# Patient Record
Sex: Female | Born: 1970 | Race: White | Hispanic: No | Smoking: Former smoker
Health system: Southern US, Community
[De-identification: ages and names within clinical notes are randomized; demographics above are authoritative.]

## PROBLEM LIST (undated history)

## (undated) DIAGNOSIS — I639 Cerebral infarction, unspecified: Secondary | ICD-10-CM

## (undated) DIAGNOSIS — E785 Hyperlipidemia, unspecified: Secondary | ICD-10-CM

## (undated) DIAGNOSIS — J45909 Unspecified asthma, uncomplicated: Secondary | ICD-10-CM

## (undated) DIAGNOSIS — R197 Diarrhea, unspecified: Secondary | ICD-10-CM

## (undated) HISTORY — DX: Diarrhea, unspecified: R19.7

## (undated) HISTORY — PX: OTHER SURGICAL HISTORY: SHX169

## (undated) HISTORY — DX: Hyperlipidemia, unspecified: E78.5

## (undated) HISTORY — DX: Unspecified asthma, uncomplicated: J45.909

## (undated) HISTORY — DX: Cerebral infarction, unspecified: I63.9

## (undated) HISTORY — PX: PARTIAL HYSTERECTOMY: SHX80

---

## 1990-07-30 HISTORY — PX: APPENDECTOMY: SHX54

## 2007-07-31 HISTORY — PX: TOTAL VAGINAL HYSTERECTOMY: SHX2548

## 2012-10-10 DIAGNOSIS — Z8249 Family history of ischemic heart disease and other diseases of the circulatory system: Secondary | ICD-10-CM | POA: Insufficient documentation

## 2012-10-14 DIAGNOSIS — F411 Generalized anxiety disorder: Secondary | ICD-10-CM

## 2012-10-14 HISTORY — DX: Generalized anxiety disorder: F41.1

## 2012-12-12 DIAGNOSIS — F418 Other specified anxiety disorders: Secondary | ICD-10-CM | POA: Insufficient documentation

## 2012-12-12 DIAGNOSIS — G43109 Migraine with aura, not intractable, without status migrainosus: Secondary | ICD-10-CM | POA: Insufficient documentation

## 2012-12-12 DIAGNOSIS — F3289 Other specified depressive episodes: Secondary | ICD-10-CM | POA: Insufficient documentation

## 2012-12-12 DIAGNOSIS — F32A Depression, unspecified: Secondary | ICD-10-CM | POA: Insufficient documentation

## 2012-12-12 DIAGNOSIS — F309 Manic episode, unspecified: Secondary | ICD-10-CM | POA: Insufficient documentation

## 2013-02-03 DIAGNOSIS — G8929 Other chronic pain: Secondary | ICD-10-CM | POA: Insufficient documentation

## 2018-07-25 DIAGNOSIS — M25512 Pain in left shoulder: Secondary | ICD-10-CM | POA: Insufficient documentation

## 2020-06-15 DIAGNOSIS — M7918 Myalgia, other site: Secondary | ICD-10-CM | POA: Insufficient documentation

## 2020-06-15 DIAGNOSIS — G8929 Other chronic pain: Secondary | ICD-10-CM | POA: Insufficient documentation

## 2020-06-15 HISTORY — DX: Other chronic pain: G89.29

## 2020-08-01 DIAGNOSIS — R531 Weakness: Secondary | ICD-10-CM | POA: Insufficient documentation

## 2021-01-31 ENCOUNTER — Other Ambulatory Visit (HOSPITAL_BASED_OUTPATIENT_CLINIC_OR_DEPARTMENT_OTHER): Payer: Self-pay | Admitting: Internal Medicine

## 2021-01-31 DIAGNOSIS — Z1231 Encounter for screening mammogram for malignant neoplasm of breast: Secondary | ICD-10-CM

## 2021-01-31 DIAGNOSIS — Z13820 Encounter for screening for osteoporosis: Secondary | ICD-10-CM

## 2021-04-20 DIAGNOSIS — G4700 Insomnia, unspecified: Secondary | ICD-10-CM

## 2021-04-20 DIAGNOSIS — F418 Other specified anxiety disorders: Secondary | ICD-10-CM

## 2021-04-20 HISTORY — DX: Other specified anxiety disorders: F41.8

## 2021-04-20 HISTORY — DX: Insomnia, unspecified: G47.00

## 2021-05-04 ENCOUNTER — Encounter: Payer: Self-pay | Admitting: Gastroenterology

## 2021-05-15 ENCOUNTER — Other Ambulatory Visit (HOSPITAL_BASED_OUTPATIENT_CLINIC_OR_DEPARTMENT_OTHER): Payer: Medicaid Other

## 2021-05-15 ENCOUNTER — Ambulatory Visit (HOSPITAL_BASED_OUTPATIENT_CLINIC_OR_DEPARTMENT_OTHER): Payer: Medicaid Other

## 2021-05-22 ENCOUNTER — Encounter: Payer: Self-pay | Admitting: General Surgery

## 2021-05-23 ENCOUNTER — Ambulatory Visit (INDEPENDENT_AMBULATORY_CARE_PROVIDER_SITE_OTHER): Payer: Medicaid Other | Admitting: Gastroenterology

## 2021-05-23 ENCOUNTER — Other Ambulatory Visit (INDEPENDENT_AMBULATORY_CARE_PROVIDER_SITE_OTHER): Payer: Medicaid Other

## 2021-05-23 ENCOUNTER — Encounter: Payer: Self-pay | Admitting: Gastroenterology

## 2021-05-23 ENCOUNTER — Other Ambulatory Visit: Payer: Self-pay

## 2021-05-23 VITALS — BP 96/68 | HR 73 | Ht 64.0 in | Wt 108.0 lb

## 2021-05-23 DIAGNOSIS — R112 Nausea with vomiting, unspecified: Secondary | ICD-10-CM

## 2021-05-23 DIAGNOSIS — R197 Diarrhea, unspecified: Secondary | ICD-10-CM | POA: Diagnosis not present

## 2021-05-23 DIAGNOSIS — R152 Fecal urgency: Secondary | ICD-10-CM

## 2021-05-23 DIAGNOSIS — R109 Unspecified abdominal pain: Secondary | ICD-10-CM | POA: Diagnosis not present

## 2021-05-23 DIAGNOSIS — R1011 Right upper quadrant pain: Secondary | ICD-10-CM

## 2021-05-23 DIAGNOSIS — R194 Change in bowel habit: Secondary | ICD-10-CM

## 2021-05-23 LAB — C-REACTIVE PROTEIN: CRP: <1 mg/dL (ref 0.5–20.0)

## 2021-05-23 MED ORDER — CLENPIQ 10-3.5-12 MG-GM -GM/160ML PO SOLN: 1.0000 | ORAL | 0 refills | Status: DC

## 2021-05-23 MED ORDER — DICYCLOMINE HCL 10 MG PO CAPS: 10.0000 mg | ORAL_CAPSULE | Freq: Four times a day (QID) | ORAL | 3 refills | Status: DC | PRN

## 2021-05-23 NOTE — Patient Instructions (Addendum)
If you are age 51 or older, your body mass index should be between 23-30. Your Body mass index is 18.54 kg/m. If this is out of the aforementioned range listed, please consider follow up with your Primary Care Provider.  If you are age 81 or younger, your body mass index should be between 19-25. Your Body mass index is 18.54 kg/m. If this is out of the aformentioned range listed, please consider follow up with your Primary Care Provider.   __________________________________________________________  The [facility] providers would like to encourage you to use [facility] to communicate with providers for non-urgent requests or questions.  Due to long hold times on the telephone, sending your provider a message by [facility] may be a faster and more efficient way to get a response.  Please allow 48 business hours for a response.  Please remember that this is for non-urgent requests.   Please go to the lab on the 2nd floor [address] before you leave the office today.   We have sent the following medications to your pharmacy for you to pick up at your convenience:  Clenpiq for colonoscopy prep Bentyl 10 mg every 6 hours as needed   Due to recent changes in healthcare laws, you may see the results of your imaging and laboratory studies on MyChart before your provider has had a chance to review them.  We understand that in some cases there may be results that are confusing or concerning to you. Not all laboratory results come back in the same time frame and the provider may be waiting for multiple results in order to interpret others.  Please give [place] 48 hours in order for your provider to thoroughly review all the results before contacting the office for clarification of your results.    Thank you for choosing me and [facility].  Vito Cirigliano, D.O.

## 2021-05-23 NOTE — Progress Notes (Signed)
Chief Complaint: Diarrhea, nausea/vomiting  Referring Provider:     Benito Mccreedy, MD   HPI:     Misty Beck is a 50 y.o. female with a history of depression with anxiety, hyperlipidemia, insomnia, referred to the Gastroenterology Clinic for evaluation of diarrhea.  New onset diarrhea over the last 8 weeks or so described as watery, nonbloody stools.  No mucus-like stools. +urgency. Occasional nocturnal stools with episodes of nocturnal fecal seepage. Has had episodes of daytime fecal incontinence.   Has had associated nausea and non-bloody emesis along with lower abdominal cramping.   Patient is otherwise without preceding exposures to include recent antibiotics, hospitalization, sick contacts, travel and denies new medications, supplements, OTCs.  Was seen by her PCM for this issue on 04/20/2021.  Was given trial of Imodium, GI referral, and labs (CBC, CMP, TSH, B12, folate, vitamin D; no results available for review). No change with Imodium. Has had some response to Lomotil since then, but stopped 7-10 days ago.  Otherwise, has not trialed any dietary modifications, fiber supplement, etc. Describes her diet as being unhealthy junk foods and poor.    No previous EGD or colonoscopy.  Reviewed available labs from 07/2020 at time of admission at [facility]: - WBC 7.9, H/H11.5/35, MCV/RDW 94/13, PLT 236 - Normal BMP, PT/INR  No known family history of CRC, GI malignancy, liver disease, pancreatic disease, or IBD.    Past Medical History:  Diagnosis Date   Asthma    Depression with anxiety 04/20/2021   Referral source Dr Doristine Section bonsu   Diarrhea    referral source   Hyperlipidemia    referral source   Insomnia 04/20/2021   referral source Dr Gayleen Orem   Stroke [facility])    Dec, 2021, Jan 2022     Past Surgical History:  Procedure Laterality Date   APPENDECTOMY  1992   left shoulder surgery     referral source Dr Roney Jaffe   PARTIAL HYSTERECTOMY      2009   Family History  Problem Relation Age of Onset   Colon polyps Mother    Skin cancer Mother    Breast cancer Mother    Heart disease Father    Colon cancer Neg Hx    Rectal cancer Neg Hx    Social History   Tobacco Use   Smoking status: Never   Smokeless tobacco: Never  Substance Use Topics   Alcohol use: Not Currently   Drug use: Not Currently   Current Outpatient Medications  Medication Sig Dispense Refill   LORazepam (ATIVAN) 0.5 MG tablet Take 0.5 mg by mouth every 8 (eight) hours.     sertraline (ZOLOFT) 100 MG tablet Take 100 mg by mouth daily.     traZODone (DESYREL) 100 MG tablet Take 100 mg by mouth at bedtime as needed for sleep.     No current facility-administered medications for this visit.   No Known Allergies   Review of Systems: All systems reviewed and negative except where noted in HPI.     Physical Exam:    Wt Readings from Last 3 Encounters:  05/23/21 108 lb (49 kg)    BP 96/68   Pulse 73   Ht _0  (1.626 m)   Wt 108 lb (49 kg)   SpO2 99%   BMI 18.54 kg/m  Constitutional:  Pleasant, in no acute distress. Psychiatric: Normal mood and affect. Behavior is normal. Cardiovascular: Normal rate, regular  rhythm. No edema Pulmonary/chest: Effort normal and breath sounds normal. No wheezing, rales or rhonchi. Abdominal: Mild RUQ pain without rebound or guarding.  No peritoneal signs.  Soft, nondistended. Bowel sounds active throughout. There are no masses palpable. No hepatomegaly. Neurological: Alert and oriented to person place and time. Skin: Skin is warm and dry. No rashes noted.   ASSESSMENT AND PLAN;   1) Diarrhea 2) Change in bowel habits 3) Fecal urgency/incontinence 4) Abdominal cramping 5) RUQ pain 6) Nausea/Vomiting  50 year old female with new onset significant changes in bowel habits along with associated abdominal pain and nausea/vomiting.  Discussed DDx today and plan as below:  - GI PCR panel, fecal calprotectin -  ESR, CRP, tTG IgA, IgA total - EGD and colonoscopy to evaluate for mucosal/luminal pathology with random and directed biopsies - Low FODMAP diet.  Provided with handout and detailed instruction today - Fiber supplement - Bentyl 10 mg prn Q6H  The indications, risks, and benefits of EGD and colonoscopy were explained to the patient in detail. Risks include but are not limited to bleeding, perforation, adverse reaction to medications, and cardiopulmonary compromise. Sequelae include but are not limited to the possibility of surgery, hospitalization, and mortality. The patient verbalized understanding and wished to proceed. All questions answered, referred to scheduler and bowel prep ordered. Further recommendations pending results of the exam.     Lavena Bullion, DO, FACG  05/23/2021, 9:07 AM   Benito Mccreedy, MD

## 2021-05-24 LAB — SEDIMENTATION RATE: Sed Rate: 6 mm/h (ref 0–30)

## 2021-05-24 LAB — TISSUE TRANSGLUTAMINASE, IGA: (tTG) Ab, IgA: <1 U/mL

## 2021-05-24 LAB — IGA: Immunoglobulin A: 175 mg/dL (ref 47–310)

## 2021-05-29 ENCOUNTER — Other Ambulatory Visit: Payer: Medicaid Other

## 2021-05-29 ENCOUNTER — Other Ambulatory Visit: Payer: Self-pay

## 2021-05-29 DIAGNOSIS — R1011 Right upper quadrant pain: Secondary | ICD-10-CM

## 2021-05-29 DIAGNOSIS — R112 Nausea with vomiting, unspecified: Secondary | ICD-10-CM

## 2021-05-29 DIAGNOSIS — R152 Fecal urgency: Secondary | ICD-10-CM

## 2021-05-29 DIAGNOSIS — R194 Change in bowel habit: Secondary | ICD-10-CM

## 2021-05-29 DIAGNOSIS — R109 Unspecified abdominal pain: Secondary | ICD-10-CM

## 2021-05-29 DIAGNOSIS — R197 Diarrhea, unspecified: Secondary | ICD-10-CM

## 2021-06-01 LAB — GI PROFILE, STOOL, PCR

## 2021-06-01 LAB — CALPROTECTIN, FECAL: Calprotectin, Fecal: <16 ug/g (ref 0–120)

## 2021-06-06 ENCOUNTER — Telehealth: Payer: Self-pay | Admitting: General Surgery

## 2021-06-06 NOTE — Telephone Encounter (Signed)
Notified the patient that her stool studies came back normal. The patient stated she is having diarrhea still. Would like to know what she can do at this time? Having her colonoscopy 07/11/2021

## 2021-06-06 NOTE — Telephone Encounter (Signed)
Okay to start Lomotil since infectious work-up was negative.

## 2021-06-06 NOTE — Telephone Encounter (Signed)
-----   Message from Christine, DO sent at 06/01/2021  4:48 PM EDT ----- Fecal calprotectin is normal and the GI PCR panel was negative/no infection.

## 2021-06-07 MED ORDER — DIPHENOXYLATE-ATROPINE 2.5-0.025 MG PO TABS: 1.0000 | ORAL_TABLET | Freq: Four times a day (QID) | ORAL | 0 refills | Status: DC | PRN

## 2021-06-07 NOTE — Telephone Encounter (Signed)
Left a detailed message that Dr Bryan Lemma stated she could start on Lomotil since her infectious workup was negative. Rx to pharmacy.

## 2021-06-19 ENCOUNTER — Encounter (HOSPITAL_BASED_OUTPATIENT_CLINIC_OR_DEPARTMENT_OTHER): Payer: Self-pay

## 2021-06-19 ENCOUNTER — Ambulatory Visit (HOSPITAL_BASED_OUTPATIENT_CLINIC_OR_DEPARTMENT_OTHER)
Admission: RE | Admit: 2021-06-19 | Discharge: 2021-06-19 | Disposition: A | Discharge status: Home / Self Care | Payer: Medicaid Other | Source: Ambulatory Visit | Attending: Internal Medicine | Admitting: Internal Medicine

## 2021-06-19 ENCOUNTER — Other Ambulatory Visit: Payer: Self-pay

## 2021-06-19 DIAGNOSIS — Z1231 Encounter for screening mammogram for malignant neoplasm of breast: Secondary | ICD-10-CM | POA: Insufficient documentation

## 2021-06-19 DIAGNOSIS — Z13820 Encounter for screening for osteoporosis: Secondary | ICD-10-CM | POA: Diagnosis present

## 2021-06-20 ENCOUNTER — Other Ambulatory Visit: Payer: Self-pay | Admitting: Internal Medicine

## 2021-06-20 DIAGNOSIS — R928 Other abnormal and inconclusive findings on diagnostic imaging of breast: Secondary | ICD-10-CM

## 2021-07-11 ENCOUNTER — Other Ambulatory Visit: Payer: Self-pay

## 2021-07-11 ENCOUNTER — Ambulatory Visit (AMBULATORY_SURGERY_CENTER): Payer: Medicaid Other | Admitting: Gastroenterology

## 2021-07-11 ENCOUNTER — Encounter: Payer: Self-pay | Admitting: Gastroenterology

## 2021-07-11 VITALS — BP 83/43 | HR 76 | Temp 97.7°F | Resp 18 | Ht 64.0 in | Wt 108.0 lb

## 2021-07-11 DIAGNOSIS — D122 Benign neoplasm of ascending colon: Secondary | ICD-10-CM | POA: Diagnosis not present

## 2021-07-11 DIAGNOSIS — R1011 Right upper quadrant pain: Secondary | ICD-10-CM | POA: Diagnosis not present

## 2021-07-11 DIAGNOSIS — K529 Noninfective gastroenteritis and colitis, unspecified: Secondary | ICD-10-CM | POA: Diagnosis not present

## 2021-07-11 DIAGNOSIS — K5289 Other specified noninfective gastroenteritis and colitis: Secondary | ICD-10-CM

## 2021-07-11 DIAGNOSIS — R197 Diarrhea, unspecified: Secondary | ICD-10-CM

## 2021-07-11 DIAGNOSIS — R109 Unspecified abdominal pain: Secondary | ICD-10-CM

## 2021-07-11 DIAGNOSIS — K640 First degree hemorrhoids: Secondary | ICD-10-CM | POA: Diagnosis not present

## 2021-07-11 DIAGNOSIS — R112 Nausea with vomiting, unspecified: Secondary | ICD-10-CM

## 2021-07-11 DIAGNOSIS — K5150 Left sided colitis without complications: Secondary | ICD-10-CM | POA: Diagnosis not present

## 2021-07-11 MED ORDER — SODIUM CHLORIDE 0.9 % IV SOLN: 500.0000 mL | Freq: Once | INTRAVENOUS | Status: DC

## 2021-07-11 NOTE — Progress Notes (Signed)
GASTROENTEROLOGY PROCEDURE H&P NOTE   Primary Care Physician: Pcp, No    Reason for Procedure:  Diarrhea, nausea/vomiting, lower abdominal cramping, RUQ pain    Plan:    EGD, colonoscopy  Patient is appropriate for endoscopic procedure(s) in the ambulatory ([place]) setting.  The nature of the procedure, as well as the risks, benefits, and alternatives were carefully and thoroughly reviewed with the patient. Ample time for discussion and questions allowed. The patient understood, was satisfied, and agreed to proceed.     HPI: Misty Beck is a 50 y.o. female who presents for endoscopic evaluation for recent nausea/vomiting, diarrhea, abdominal cramping, and RUQ pain.  Evaluation to date has been unrevealing since CBC, CMP, TSH, B12, folate.  Vitamin D, ESR, CRP, TTG, GI PCR panel, fecal calprotectin.  No previous EGD or colonoscopy.  Past Medical History:  Diagnosis Date   Asthma    Depression with anxiety 04/20/2021   Referral source Dr Doristine Section bonsu   Diarrhea    referral source   Hyperlipidemia    referral source   Insomnia 04/20/2021   referral source Dr Gayleen Orem   Stroke [facility])    Dec, 2021, Jan 2022    Past Surgical History:  Procedure Laterality Date   APPENDECTOMY  1992   left shoulder surgery     referral source Dr Roney Jaffe   PARTIAL HYSTERECTOMY     2009    Prior to Admission medications   Medication Sig Start Date End Date Taking? Authorizing Provider  dicyclomine (BENTYL) 10 MG capsule Take 1 capsule (10 mg total) by mouth every 6 (six) hours as needed for spasms. 05/23/21   Alper Guilmette V, DO  diphenoxylate-atropine (LOMOTIL) 2.5-0.025 MG tablet Take 1 tablet by mouth 4 (four) times daily as needed for up to 30 doses for diarrhea or loose stools. 06/07/21   Jeniffer Culliver V, DO  LORazepam (ATIVAN) 0.5 MG tablet Take 0.5 mg by mouth every 8 (eight) hours.    [provider]  sertraline (ZOLOFT) 100 MG tablet Take 100 mg by mouth  daily. 05/03/21   [provider]  Sod Picosulfate-Mag Ox-Cit Acd (CLENPIQ) 10-3.5-12 MG-GM -GM/160ML SOLN Take 1 kit by mouth as directed. 05/23/21   Abbagale Goguen V, DO  traZODone (DESYREL) 100 MG tablet Take 100 mg by mouth at bedtime as needed for sleep.    [provider]    Current Outpatient Medications  Medication Sig Dispense Refill   dicyclomine (BENTYL) 10 MG capsule Take 1 capsule (10 mg total) by mouth every 6 (six) hours as needed for spasms. 60 capsule 3   diphenoxylate-atropine (LOMOTIL) 2.5-0.025 MG tablet Take 1 tablet by mouth 4 (four) times daily as needed for up to 30 doses for diarrhea or loose stools. 30 tablet 0   LORazepam (ATIVAN) 0.5 MG tablet Take 0.5 mg by mouth every 8 (eight) hours.     sertraline (ZOLOFT) 100 MG tablet Take 100 mg by mouth daily.     Sod Picosulfate-Mag Ox-Cit Acd (CLENPIQ) 10-3.5-12 MG-GM -GM/160ML SOLN Take 1 kit by mouth as directed. 320 mL 0   traZODone (DESYREL) 100 MG tablet Take 100 mg by mouth at bedtime as needed for sleep.     Current Facility-Administered Medications  Medication Dose Route Frequency Provider Last Rate Last Admin   0.9 %  sodium chloride infusion  500 mL Intravenous Once Riggins Cisek V, DO        Allergies as of 07/11/2021   (No Known Allergies)  Family History  Problem Relation Age of Onset   Colon polyps Mother    Skin cancer Mother    Breast cancer Mother 2   Heart disease Father    Colon cancer Neg Hx    Rectal cancer Neg Hx     Social History   Socioeconomic History   Marital status: Divorced    Spouse name: Not on file   Number of children: 2   Years of education: Not on file   Highest education level: Not on file  Occupational History   Not on file  Tobacco Use   Smoking status: Former    Types: Cigarettes   Smokeless tobacco: Never  Substance and Sexual Activity   Alcohol use: Not Currently   Drug use: Not Currently   Sexual activity: Not on file  Other  Topics Concern   Not on file  Social History Narrative   Not on file   Social Determinants of Health   Financial Resource Strain: Not on file  Food Insecurity: Not on file  Transportation Needs: Not on file  Physical Activity: Not on file  Stress: Not on file  Social Connections: Not on file  Intimate Partner Violence: Not on file    Physical Exam: Vital signs in last 24 hours: '@BP'  (!) 103/59    Pulse 74    Temp 97.7 F (36.5 C)    Ht '5\' 4"'  (1.626 m)    Wt 108 lb (49 kg)    SpO2 97%    BMI 18.54 kg/m  GEN: NAD EYE: Sclerae anicteric ENT: MMM CV: Non-tachycardic Pulm: CTA b/l GI: Soft, NT/ND NEURO:  Alert & Oriented x [place], DO [facility]   07/11/2021 10:18 AM

## 2021-07-11 NOTE — Progress Notes (Signed)
To PACU, VSS. Report to [email or web address] 

## 2021-07-11 NOTE — Patient Instructions (Addendum)
Thank you for allowing [place] to care for you today! Await results, approximately 2 weeks.  and medications today. Recommend use of fiber daily, for example Citrucel, Fibercon, Konsyl, or Metamucil Recommend following a FODMAP diet if not already following.  Handout provided. Return to normal daily activities tomorrow. If nausea/vomiting persist, can plan for further evaluation with Gastric Emptying Study and possible Right upper quadrant ultrasound and HIDA scan for atypical biliary etiology.     YOU HAD AN ENDOSCOPIC PROCEDURE TODAY AT [facility]:   Refer to the procedure report that was given to you for any specific questions about what was found during the examination.  If the procedure report does not answer your questions, please call your gastroenterologist to clarify.  If you requested that your care partner not be given the details of your procedure findings, then the procedure report has been included in a sealed envelope for you to review at your convenience later.  YOU SHOULD EXPECT: Some feelings of bloating in the abdomen. Passage of more gas than usual.  Walking can help get rid of the air that was put into your GI tract during the procedure and reduce the bloating. If you had a lower endoscopy (such as a colonoscopy or flexible sigmoidoscopy) you may notice spotting of blood in your stool or on the toilet paper. If you underwent a bowel prep for your procedure, you may not have a normal bowel movement for a few days.  Please Note:  You might notice some irritation and congestion in your nose or some drainage.  This is from the oxygen used during your procedure.  There is no need for concern and it should clear up in a day or so.  SYMPTOMS TO REPORT IMMEDIATELY:  Following lower endoscopy (colonoscopy or flexible sigmoidoscopy):  Excessive amounts of blood in the stool  Significant tenderness or worsening of abdominal pains  Swelling of the abdomen that is new,  acute  Fever of 100F or higher  Following upper endoscopy (EGD)  Vomiting of blood or coffee ground material  New chest pain or pain under the shoulder blades  Painful or persistently difficult swallowing  New shortness of breath  Fever of 100F or higher  Black, tarry-looking stools  For urgent or emergent issues, a gastroenterologist can be reached at any hour by calling [phone]. Do not use MyChart messaging for urgent concerns.    DIET:  We do recommend a small meal at first, but then you may proceed to your regular diet.  Drink plenty of fluids but you should avoid alcoholic beverages for 24 hours.  ACTIVITY:  You should plan to take it easy for the rest of today and you should NOT DRIVE or use heavy machinery until tomorrow (because of the sedation medicines used during the test).    FOLLOW UP: Our staff will call the number listed on your records 48-72 hours following your procedure to check on you and address any questions or concerns that you may have regarding the information given to you following your procedure. If we do not reach you, we will leave a message.  We will attempt to reach you two times.  During this call, we will ask if you have developed any symptoms of COVID 19. If you develop any symptoms (ie: fever, flu-like symptoms, shortness of breath, cough etc.) before then, please call [phone].  If you test positive for Covid 19 in the 2 weeks post procedure, please call and report this information  to [place].    If any biopsies were taken you will be contacted by phone or by letter within the next 1-3 weeks.  Please call us at [phone] if you have not heard about the biopsies in 3 weeks.    SIGNATURES/CONFIDENTIALITY: You and/or your care partner have signed paperwork which will be entered into your electronic medical record.  These signatures attest to the fact that that the information above on your After Visit Summary has been reviewed and is understood.   Full responsibility of the confidentiality of this discharge information lies with you and/or your care-partner.

## 2021-07-11 NOTE — Op Note (Signed)
[facility] Patient Name: Misty Beck Procedure Date: 07/11/2021 10:36 AM MRN: [id number] Endoscopist: Gerrit Heck , MD Age: 50 Referring MD:  Date of Birth: [birth year] Gender: Female Account #: [id number] Procedure:                Upper GI endoscopy Indications:              Abdominal pain in the right upper quadrant,                            Diarrhea, Nausea with vomiting Medicines:                Monitored Anesthesia Care Procedure:                Pre-Anesthesia Assessment:                           - Prior to the procedure, a History and Physical                            was performed, and patient medications and                            allergies were reviewed. The patient's tolerance of                            previous anesthesia was also reviewed. The risks                            and benefits of the procedure and the sedation                            options and risks were discussed with the patient.                            All questions were answered, and informed consent                            was obtained. Prior Anticoagulants: The patient has                            taken no previous anticoagulant or antiplatelet                            agents. ASA Grade Assessment: II - A patient with                            mild systemic disease. After reviewing the risks                            and benefits, the patient was deemed in                            satisfactory condition to undergo the procedure.  After obtaining informed consent, the endoscope was                            passed under direct vision. Throughout the                            procedure, the patient's blood pressure, pulse, and                            oxygen saturations were monitored continuously. The                            GIF [id number] #[id number] was introduced through the                            mouth, and advanced to the second  part of duodenum.                            The upper GI endoscopy was accomplished without                            difficulty. The patient tolerated the procedure                            well. Scope In: Scope Out: Findings:                 The examined esophagus was normal.                           The Z-line was regular and was found 40 cm from the                            incisors.                           The entire examined stomach was normal. Biopsies                            were taken with a cold forceps for Helicobacter                            pylori testing. Estimated blood loss was minimal.                           The examined duodenum was normal. Biopsies were                            taken with a cold forceps for histology. Estimated                            blood loss was minimal. Complications:            No immediate complications. Estimated Blood Loss:     Estimated blood loss was minimal. Impression:               -  Normal esophagus.                           - Z-line regular, 40 cm from the incisors.                           - Normal stomach. Biopsied.                           - Normal examined duodenum. Biopsied. Recommendation:           - Patient has a contact number available for                            emergencies. The signs and symptoms of potential                            delayed complications were discussed with the                            patient. Return to normal activities tomorrow.                            Written discharge instructions were provided to the                            patient.                           - Resume previous diet.                           - Continue present medications.                           - Await pathology results.                           - Perform a colonoscopy today.                           - Return to GI clinic PRN.                           - If nausea/vomiting persist, can  plan for further                            evaluation with Gastric Emptying Study and possible                            RUQ [place] and HIDA scan for atypical biliary etiology. Gerrit Heck, MD 07/11/2021 11:13:04 AM

## 2021-07-11 NOTE — Op Note (Signed)
[facility] Patient Name: Misty Beck Procedure Date: 07/11/2021 10:35 AM MRN: [id number] Endoscopist: Gerrit Heck , MD Age: 50 Referring MD:  Date of Birth: 01/25/1971 Gender: Female Account #: [id number] Procedure:                Colonoscopy Indications:              Lower abdominal cramping, Change in bowel habits,                            Diarrhea                           No previous colonoscopy.                           Recent evaluation otherwise unrevealing, to include                            CBC, CMP, TSH, B12, folate, Vitamin D, ESR, CRP,                            TTG, GI PCR panel, fecal calprotectin. Medicines:                Monitored Anesthesia Care Procedure:                Pre-Anesthesia Assessment:                           - Prior to the procedure, a History and Physical                            was performed, and patient medications and                            allergies were reviewed. The patient's tolerance of                            previous anesthesia was also reviewed. The risks                            and benefits of the procedure and the sedation                            options and risks were discussed with the patient.                            All questions were answered, and informed consent                            was obtained. Prior Anticoagulants: The patient has                            taken no previous anticoagulant or antiplatelet  agents. ASA Grade Assessment: II - A patient with                            mild systemic disease. After reviewing the risks                            and benefits, the patient was deemed in                            satisfactory condition to undergo the procedure.                           After obtaining informed consent, the colonoscope                            was passed under direct vision. Throughout the                            procedure, the  patient's blood pressure, pulse, and                            oxygen saturations were monitored continuously. The                            PCF-HQ190L Colonoscope was introduced through the                            anus and advanced to the the terminal ileum. The                            colonoscopy was performed without difficulty. The                            patient tolerated the procedure well. The quality                            of the bowel preparation was good. The terminal                            ileum, ileocecal valve, appendiceal orifice, and                            rectum were photographed. Scope In: 10:49:17 AM Scope Out: 11:08:36 AM Scope Withdrawal Time: 0 hours 14 minutes 37 seconds  Total Procedure Duration: 0 hours 19 minutes 19 seconds  Findings:                 The perianal and digital rectal examinations were                            normal.                           A 8 mm polyp was found in the ascending colon. The  polyp was sessile. The polyp was removed with a                            cold snare. Resection and retrieval were complete.                            Estimated blood loss was minimal.                           The mucosa was otherwise normal throughout the                            colon. No areas of mucosal erythema, edema,                            erosions, or ulceration. Biopsies for histology                            were taken with a cold forceps from the right colon                            and left colon for evaluation of microscopic                            colitis. Estimated blood loss was minimal.                           Non-bleeding internal hemorrhoids were found during                            retroflexion. The hemorrhoids were small.                           The terminal ileum appeared normal. Complications:            No immediate complications. Estimated Blood Loss:      Estimated blood loss was minimal. Impression:               - One 8 mm polyp in the ascending colon, removed                            with a cold snare. Resected and retrieved.                           - Normal mucosa in the entire examined colon.                            Biopsied.                           - Non-bleeding internal hemorrhoids.                           - The examined portion of the ileum was normal. Recommendation:           -  Patient has a contact number available for                            emergencies. The signs and symptoms of potential                            delayed complications were discussed with the                            patient. Return to normal activities tomorrow.                            Written discharge instructions were provided to the                            patient.                           - Resume previous diet.                           - Continue present medications.                           - Await pathology results.                           - Repeat colonoscopy for surveillance based on                            pathology results.                           - Return to GI office PRN.                           - Use fiber, for example Citrucel, Fibercon, Konsyl                            or Metamucil.                           - If not already started, recommend starting low                            FODMAP diet. Gerrit Heck, MD 07/11/2021 11:18:15 AM

## 2021-07-13 ENCOUNTER — Telehealth: Payer: Self-pay

## 2021-07-13 NOTE — Telephone Encounter (Signed)
°  Follow up Call-  Call back number 07/11/2021  Post procedure Call Back phone  # [phone]  Permission to leave phone message Yes     Patient questions:  Do you have a fever, pain , or abdominal swelling? No. Pain Score  0 *  Have you tolerated food without any problems? Yes.    Have you been able to return to your normal activities? Yes.    Do you have any questions about your discharge instructions: Diet   No. Medications  No. Follow up visit  No.  Do you have questions or concerns about your Care? No.  Actions: * If pain score is 4 or above: No action needed, pain <4.

## 2021-07-28 ENCOUNTER — Encounter: Payer: Self-pay | Admitting: Gastroenterology

## 2021-08-07 ENCOUNTER — Other Ambulatory Visit: Payer: Medicaid Other

## 2021-08-09 ENCOUNTER — Other Ambulatory Visit: Payer: Self-pay

## 2021-08-09 ENCOUNTER — Ambulatory Visit
Admission: RE | Admit: 2021-08-09 | Discharge: 2021-08-09 | Disposition: A | Discharge status: Home / Self Care | Payer: Medicaid Other | Source: Ambulatory Visit | Attending: Internal Medicine | Admitting: Internal Medicine

## 2021-08-09 DIAGNOSIS — R928 Other abnormal and inconclusive findings on diagnostic imaging of breast: Secondary | ICD-10-CM

## 2021-09-22 ENCOUNTER — Ambulatory Visit (INDEPENDENT_AMBULATORY_CARE_PROVIDER_SITE_OTHER): Payer: Medicaid Other | Admitting: Family

## 2021-09-22 ENCOUNTER — Other Ambulatory Visit: Payer: Self-pay

## 2021-09-22 ENCOUNTER — Emergency Department (HOSPITAL_BASED_OUTPATIENT_CLINIC_OR_DEPARTMENT_OTHER): Payer: Medicaid Other

## 2021-09-22 ENCOUNTER — Encounter (HOSPITAL_BASED_OUTPATIENT_CLINIC_OR_DEPARTMENT_OTHER): Payer: Self-pay | Admitting: Emergency Medicine

## 2021-09-22 ENCOUNTER — Emergency Department (HOSPITAL_BASED_OUTPATIENT_CLINIC_OR_DEPARTMENT_OTHER)
Admission: EM | Admit: 2021-09-22 | Discharge: 2021-09-22 | Disposition: A | Discharge status: Home / Self Care | Payer: Medicaid Other | Attending: Emergency Medicine | Admitting: Emergency Medicine

## 2021-09-22 VITALS — BP 100/60 | HR 82 | Temp 98.1°F | Ht 64.0 in | Wt 103.6 lb

## 2021-09-22 DIAGNOSIS — G43709 Chronic migraine without aura, not intractable, without status migrainosus: Secondary | ICD-10-CM

## 2021-09-22 DIAGNOSIS — I951 Orthostatic hypotension: Secondary | ICD-10-CM | POA: Diagnosis not present

## 2021-09-22 DIAGNOSIS — Z8669 Personal history of other diseases of the nervous system and sense organs: Secondary | ICD-10-CM | POA: Insufficient documentation

## 2021-09-22 DIAGNOSIS — K529 Noninfective gastroenteritis and colitis, unspecified: Secondary | ICD-10-CM | POA: Diagnosis not present

## 2021-09-22 DIAGNOSIS — E860 Dehydration: Secondary | ICD-10-CM | POA: Insufficient documentation

## 2021-09-22 DIAGNOSIS — Z20822 Contact with and (suspected) exposure to covid-19: Secondary | ICD-10-CM | POA: Insufficient documentation

## 2021-09-22 DIAGNOSIS — Z8673 Personal history of transient ischemic attack (TIA), and cerebral infarction without residual deficits: Secondary | ICD-10-CM | POA: Diagnosis not present

## 2021-09-22 DIAGNOSIS — R2990 Unspecified symptoms and signs involving the nervous system: Secondary | ICD-10-CM | POA: Diagnosis not present

## 2021-09-22 DIAGNOSIS — R55 Syncope and collapse: Secondary | ICD-10-CM | POA: Diagnosis present

## 2021-09-22 DIAGNOSIS — R112 Nausea with vomiting, unspecified: Secondary | ICD-10-CM

## 2021-09-22 DIAGNOSIS — G4700 Insomnia, unspecified: Secondary | ICD-10-CM | POA: Insufficient documentation

## 2021-09-22 HISTORY — DX: Chronic migraine without aura, not intractable, without status migrainosus: G43.709

## 2021-09-22 LAB — COMPREHENSIVE METABOLIC PANEL
ALT: 20 U/L (ref 0–44)
AST: 20 U/L (ref 15–41)
Albumin: 4.6 g/dL (ref 3.5–5.0)
Alkaline Phosphatase: 47 U/L (ref 38–126)
Anion gap: 8 (ref 5–15)
BUN: 19 mg/dL (ref 6–20)
CO2: 27 mmol/L (ref 22–32)
Calcium: 9.6 mg/dL (ref 8.9–10.3)
Chloride: 100 mmol/L (ref 98–111)
Creatinine, Ser: 1.01 mg/dL — ABNORMAL HIGH (ref 0.44–1.00)
GFR, Estimated: >60 mL/min (ref >60)
Glucose, Bld: 97 mg/dL (ref 70–99)
Potassium: 4.5 mmol/L (ref 3.5–5.1)
Sodium: 135 mmol/L (ref 135–145)
Total Bilirubin: 1 mg/dL (ref 0.3–1.2)
Total Protein: 7.5 g/dL (ref 6.5–8.1)

## 2021-09-22 LAB — CBC WITH DIFFERENTIAL/PLATELET
Abs Immature Granulocytes: 0.01 10*3/uL (ref 0.00–0.07)
Basophils Absolute: 0.1 10*3/uL (ref 0.0–0.1)
Basophils Relative: 1 %
Eosinophils Absolute: 0.1 10*3/uL (ref 0.0–0.5)
Eosinophils Relative: 2 %
HCT: 42.3 % (ref 36.0–46.0)
Hemoglobin: 13.7 g/dL (ref 12.0–15.0)
Immature Granulocytes: 0 %
Lymphocytes Relative: 31 %
Lymphs Abs: 1.8 10*3/uL (ref 0.7–4.0)
MCH: 30.3 pg (ref 26.0–34.0)
MCHC: 32.4 g/dL (ref 30.0–36.0)
MCV: 93.6 fL (ref 80.0–100.0)
Monocytes Absolute: 0.5 10*3/uL (ref 0.1–1.0)
Monocytes Relative: 9 %
Neutro Abs: 3.2 10*3/uL (ref 1.7–7.7)
Neutrophils Relative %: 57 %
Platelets: 309 10*3/uL (ref 150–400)
RBC: 4.52 MIL/uL (ref 3.87–5.11)
RDW: 11.9 % (ref 11.5–15.5)
WBC: 5.7 10*3/uL (ref 4.0–10.5)
nRBC: 0 % (ref 0.0–0.2)

## 2021-09-22 LAB — RESP PANEL BY RT-PCR (FLU A&B, COVID) ARPGX2
Influenza A by PCR: NEGATIVE
Influenza B by PCR: NEGATIVE
SARS Coronavirus 2 by RT PCR: NEGATIVE

## 2021-09-22 LAB — TROPONIN I (HIGH SENSITIVITY): Troponin I (High Sensitivity): 2 ng/L (ref <18)

## 2021-09-22 MED ORDER — SODIUM CHLORIDE 0.9 % IV SOLN: INTRAVENOUS | Status: DC

## 2021-09-22 MED ORDER — KETOROLAC TROMETHAMINE 15 MG/ML IJ SOLN
15.0000 mg | Freq: Once | INTRAMUSCULAR | Status: AC
  Administered 2021-09-22: 15 mg via INTRAVENOUS
  Filled 2021-09-22: qty 1

## 2021-09-22 MED ORDER — SODIUM CHLORIDE 0.9 % IV BOLUS
1000.0000 mL | Freq: Once | INTRAVENOUS | Status: AC
  Administered 2021-09-22: 1000 mL via INTRAVENOUS

## 2021-09-22 MED ORDER — SODIUM CHLORIDE 0.9 % IV BOLUS: 1000.0000 mL | Freq: Once | INTRAVENOUS | Status: DC

## 2021-09-22 MED ORDER — DIPHENHYDRAMINE HCL 50 MG/ML IJ SOLN
12.5000 mg | Freq: Once | INTRAMUSCULAR | Status: AC
  Administered 2021-09-22: 12.5 mg via INTRAVENOUS
  Filled 2021-09-22: qty 1

## 2021-09-22 MED ORDER — ONDANSETRON 4 MG PO TBDP: 4.0000 mg | ORAL_TABLET | Freq: Three times a day (TID) | ORAL | 0 refills | Status: DC | PRN

## 2021-09-22 MED ORDER — METOCLOPRAMIDE HCL 5 MG/ML IJ SOLN
10.0000 mg | Freq: Once | INTRAMUSCULAR | Status: AC
  Administered 2021-09-22: 10 mg via INTRAVENOUS
  Filled 2021-09-22: qty 2

## 2021-09-22 NOTE — ED Triage Notes (Signed)
Pt presents to ED POV. Pt c/o multiple episodes of LOC, dizziness, and weakness. Pt reports that it began on Monday. Pt reports to 3 unwitnessed fall this week. Pt reports that she was on blood thinners after stroke last year but stopped taking them. Reports that she notified PCP. Pt reports speech and memory issues from previous strokes.

## 2021-09-22 NOTE — ED Provider Notes (Signed)
[facility] EMERGENCY DEPARTMENT Provider Note   CSN: [id number] Arrival date & time: 09/22/21  1047     History  Chief Complaint  Patient presents with   Loss of Consciousness    Misty Beck is a 51 y.o. female.  Pt is a 51 yo wf with a hx of depression, hyperlipidemia, migraines and 2 prior possible CVA.  Pt had left sided weakness and received tpa.  However, MRI was nl.  She was supposed to f/u with neurology, but has not followed up.  This week, pt has had n/v/d.  She has not been able to drink or eat much as she either vomits or has diarrhea after eating.  She has passed out 3 times.  She said she feels like everything gets black and then she passes out.  She denies hurting herself when she passes out.  She has no new neurological sx today.  She drove to her doctor's office for a new patient visit today.  She is able to ambulate, to speak, and see.  She was sent here for further eval.      Home Medications Prior to Admission medications   Medication Sig Start Date End Date Taking? Authorizing Provider  ondansetron (ZOFRAN-ODT) 4 MG disintegrating tablet Take 1 tablet (4 mg total) by mouth every 8 (eight) hours as needed for nausea or vomiting. 09/22/21  Yes Isla Pence, MD  diclofenac Sodium (VOLTAREN) 1 % GEL Apply topically 4 (four) times daily.    [provider]  dicyclomine (BENTYL) 10 MG capsule Take 1 capsule (10 mg total) by mouth every 6 (six) hours as needed for spasms. Patient not taking: Reported on 09/22/2021 05/23/21   Cirigliano, Dominic Pea, DO  diphenoxylate-atropine (LOMOTIL) 2.5-0.025 MG tablet Take 1 tablet by mouth 4 (four) times daily as needed for up to 30 doses for diarrhea or loose stools. 06/07/21   Cirigliano, Vito V, DO  LORazepam (ATIVAN) 0.5 MG tablet Take 0.5 mg by mouth every 8 (eight) hours.    [provider]  sertraline (ZOLOFT) 100 MG tablet Take 100 mg by mouth daily. 05/03/21   [provider]  traZODone  (DESYREL) 100 MG tablet Take 100 mg by mouth at bedtime as needed for sleep.    [provider]      Allergies    Patient has no known allergies.    Review of Systems   Review of Systems  Neurological:  Positive for syncope.  All other systems reviewed and are negative.  Physical Exam Updated Vital Signs BP 109/69    Pulse 73    Temp 98.1 F (36.7 C) (Oral)    Resp 15    SpO2 100%  Physical Exam Vitals and nursing note reviewed.  Constitutional:      Appearance: Normal appearance.  HENT:     Head: Normocephalic and atraumatic.     Right Ear: External ear normal.     Left Ear: External ear normal.     Nose: Nose normal.     Mouth/Throat:     Mouth: Mucous membranes are dry.  Eyes:     Extraocular Movements: Extraocular movements intact.     Conjunctiva/sclera: Conjunctivae normal.     Pupils: Pupils are equal, round, and reactive to light.  Cardiovascular:     Rate and Rhythm: Normal rate and regular rhythm.     Pulses: Normal pulses.     Heart sounds: Normal heart sounds.  Pulmonary:     Effort: Pulmonary  effort is normal.     Breath sounds: Normal breath sounds.  Abdominal:     General: Abdomen is flat. Bowel sounds are normal.     Palpations: Abdomen is soft.  Musculoskeletal:        General: Normal range of motion.     Cervical back: Normal range of motion and neck supple.  Skin:    General: Skin is warm.     Capillary Refill: Capillary refill takes less than 2 seconds.  Neurological:     General: No focal deficit present.     Mental Status: She is alert and oriented to person, place, and time.  Psychiatric:        Mood and Affect: Mood normal.        Behavior: Behavior normal.    ED Results / Procedures / Treatments   Labs (all labs ordered are listed, but only abnormal results are displayed) Labs Reviewed  COMPREHENSIVE METABOLIC PANEL - Abnormal; Notable for the following components:      Result Value   Creatinine, Ser 1.01 (*)    All other  components within normal limits  RESP PANEL BY RT-PCR (FLU A&B, COVID) ARPGX2  CBC WITH DIFFERENTIAL/PLATELET  PREGNANCY, URINE  URINALYSIS, ROUTINE W REFLEX MICROSCOPIC  CBG MONITORING, ED  TROPONIN I (HIGH SENSITIVITY)  TROPONIN I (HIGH SENSITIVITY)    EKG EKG Interpretation  Date/Time:  Friday September 22 2021 11:03:00 EST Ventricular Rate:  71 PR Interval:  154 QRS Duration: 85 QT Interval:  389 QTC Calculation: 423 R Axis:   72 Text Interpretation: Sinus rhythm Consider right atrial enlargement No old tracing to compare Confirmed by Isla Pence [phone]) on 09/22/2021 11:14:14 AM  Radiology CT HEAD WO CONTRAST  Result Date: 09/22/2021 CLINICAL DATA:  Headache, sudden, severe EXAM: CT HEAD WITHOUT CONTRAST TECHNIQUE: Contiguous axial images were obtained from the base of the skull through the vertex without intravenous contrast. RADIATION DOSE REDUCTION: This exam was performed according to the departmental dose-optimization program which includes automated exposure control, adjustment of the mA and/or kV according to patient size and/or use of iterative reconstruction technique. COMPARISON:  07/16/2020 report (images not available). FINDINGS: Brain: No evidence of acute large vascular territory infarction, hemorrhage, hydrocephalus, extra-axial collection or mass lesion/mass effect. Vascular: No hyperdense vessel identified. Skull: No acute fracture. Sinuses/Orbits: Clear visualized sinuses.  Unremarkable orbits. Other: No mastoid effusions. IMPRESSION: No evidence of acute intracranial abnormality. Electronically Signed   By: Margaretha Sheffield M.D.   On: 09/22/2021 11:29   DG Chest Portable 1 View  Result Date: 09/22/2021 CLINICAL DATA:  Altered mental status. EXAM: PORTABLE CHEST 1 VIEW COMPARISON:  None. FINDINGS: The heart size and mediastinal contours are within normal limits. Both lungs are clear. The visualized skeletal structures are unremarkable. IMPRESSION: No active  disease. Electronically Signed   By: Keane Police D.O.   On: 09/22/2021 11:51    Procedures Procedures    Medications Ordered in ED Medications  sodium chloride 0.9 % bolus 1,000 mL (0 mLs Intravenous Stopped 09/22/21 1218)    And  0.9 %  sodium chloride infusion (has no administration in time range)  sodium chloride 0.9 % bolus 1,000 mL (0 mLs Intravenous Stopped 09/22/21 1322)    And  0.9 %  sodium chloride infusion (0 mLs Intravenous Stopped 09/22/21 1344)  sodium chloride 0.9 % bolus 1,000 mL (has no administration in time range)  metoCLOPramide (REGLAN) injection 10 mg (10 mg Intravenous Given 09/22/21 1128)  diphenhydrAMINE (BENADRYL) injection 12.5 mg (  12.5 mg Intravenous Given 09/22/21 1127)  ketorolac (TORADOL) 15 MG/ML injection 15 mg (15 mg Intravenous Given 09/22/21 1127)    ED Course/ Medical Decision Making/ A&P                           Medical Decision Making Amount and/or Complexity of Data Reviewed Labs: ordered. Radiology: ordered.  Risk Prescription drug management.   This patient presents to the ED for concern of syncope, this involves an extensive number of treatment options, and is a complaint that carries with it a high risk of complications and morbidity.  The differential diagnosis includes orthostatic, cardiac, pulmonary, neurologic   Co morbidities that complicate the patient evaluation  Migraine   Additional history obtained:  Additional history obtained from epic chart review External records from outside source obtained and reviewed including pt's pcp   Lab Tests:  I Ordered, and personally interpreted labs.  The pertinent results include:  cmp nl, cbc nl, covid/flu nl, trop nl   Imaging Studies ordered:  I ordered imaging studies including CXR and CT head  I independently visualized and interpreted imaging which showed nothing acute I agree with the radiologist interpretation   Cardiac Monitoring:  The patient was maintained on a  cardiac monitor.  I personally viewed and interpreted the cardiac monitored which showed an underlying rhythm of: nsr   Medicines ordered and prescription drug management:  I ordered medication including IVFs and zofran  for dehydration and nausea  Reevaluation of the patient after these medicines showed that the patient improved I have reviewed the patients home medicines and have made adjustments as needed  Problem List / ED Course:  Syncope:  Likely due to dehydration as she's had a viral gastroenteritis.  She is feeling much better after fluids and zofran.  She is able to tolerate po fluids.  She has no new neurologic sx, so it is unlikely that it's a stroke.  Pt is ready to go home.  She knows to return if worse.  F/u with pcp.   Reevaluation:  After the interventions noted above, I reevaluated the patient and found that they have :improved   Social Determinants of Health:  Lives with significant other   Dispostion:  After consideration of the diagnostic results and the patients response to treatment, I feel that the patent would benefit from discharge with outpatient f/u.          Final Clinical Impression(s) / ED Diagnoses Final diagnoses:  Gastroenteritis  Nausea and vomiting, unspecified vomiting type  Dehydration  Orthostatic syncope    Rx / DC Orders ED Discharge Orders          Ordered    ondansetron (ZOFRAN-ODT) 4 MG disintegrating tablet  Every 8 hours PRN        09/22/21 1337              Isla Pence, MD 09/22/21 1349

## 2021-09-22 NOTE — ED Notes (Signed)
Pt able to tolerate PO food well. Pt reports she wants to go home. Dr. Gilford Raid notified.

## 2021-09-22 NOTE — Progress Notes (Signed)
Misty Beck is a 51 y.o. female with the following history as recorded in Epic[employer]:  Patient Active Problem List   Diagnosis Date Noted   History of migraine headaches 09/22/2021   Insomnia 09/22/2021   Bipolar I disorder, single manic episode ([place]) 12/12/2012   Depressive disorder, not elsewhere classified 12/12/2012   Anxiety state 10/14/2012    Current Outpatient Medications  Medication Sig Dispense Refill   diclofenac Sodium (VOLTAREN) 1 % GEL Apply topically 4 (four) times daily.     diphenoxylate-atropine (LOMOTIL) 2.5-0.025 MG tablet Take 1 tablet by mouth 4 (four) times daily as needed for up to 30 doses for diarrhea or loose stools. 30 tablet 0   LORazepam (ATIVAN) 0.5 MG tablet Take 0.5 mg by mouth every 8 (eight) hours.     sertraline (ZOLOFT) 100 MG tablet Take 100 mg by mouth daily.     traZODone (DESYREL) 100 MG tablet Take 100 mg by mouth at bedtime as needed for sleep.     dicyclomine (BENTYL) 10 MG capsule Take 1 capsule (10 mg total) by mouth every 6 (six) hours as needed for spasms. (Patient not taking: Reported on 09/22/2021) 60 capsule 3   No current facility-administered medications for this visit.    Allergies: Patient has no known allergies.  Past Medical History:  Diagnosis Date   Asthma    Depression with anxiety 04/20/2021   Referral source Dr Doristine Section bonsu   Diarrhea    referral source   Hyperlipidemia    referral source   Insomnia 04/20/2021   referral source Dr Gayleen Orem   Stroke [facility])    Dec, 2021, Jan 2022    Past Surgical History:  Procedure Laterality Date   APPENDECTOMY  1992   left shoulder surgery     referral source Dr Roney Jaffe   PARTIAL HYSTERECTOMY     2009    Family History  Problem Relation Age of Onset   Colon polyps Mother    Skin cancer Mother    Breast cancer Mother 52   Heart disease Father    Colon cancer Neg Hx    Rectal cancer Neg Hx     Social History   Tobacco Use   Smoking status: Former    Types:  Cigarettes   Smokeless tobacco: Never  Substance Use Topics   Alcohol use: Not Currently    Subjective:   Presents today as a new patient; notes that she had 2 strokes in 06/2020 and 07/2020; not currently under care of neurology; is not currently taking any medication for prevention- she never took aspirin/ statin because she "does not like medication."  When she was discharged from [facility] in January 2022, she was asked to follow up with PCP, neurology, ophthalmology but patient did not follow up as recommended.   She moved from [place] to [facility] in May 2022 and was working with PCP, Dr. Doristine Section Bonsu; unfortunately, those records are not available for review.   Notes that in the past week, she has been having episodes of feeling like "she is blacking out"- notes that "everything goes black and then I can't see/ and I fall." Patient is unsure how long she is "out." Notes that has had at least 3 episodes like this in the past week; Did not feel that she needed to call EMS/ go to the ER "because I had this appointment here today."     Objective:  Vitals:   09/22/21 1008  BP: 100/60  Pulse: 82  Temp: 98.1 F (36.7 C)  TempSrc: Oral  SpO2: 96%  Weight: 103 lb 9.6 oz (47 kg)  Height: 5\' 4"  (1.626 m)    General: Well developed, well nourished, in no acute distress  Skin : Warm and dry.  Lungs: Respirations unlabored; clear to auscultation bilaterally without wheeze, rales, rhonchi  CVS exam: normal rate and regular rhythm.  Neurologic: Alert and oriented; speech intact; face symmetrical; moves all extremities well; CNII-XII intact without focal deficit   Assessment:  1. Syncope, unspecified syncope type   2. History of stroke   3. Stroke-like symptoms     Plan:  Patient's symptoms are concerning for pending stroke especially with her medical history; she is taken directly to the ER from our office and ER provider is notified;  Follow up to be determined;   No follow-ups  on file.  No orders of the defined types were placed in this encounter.   Requested Prescriptions    No prescriptions requested or ordered in this encounter

## 2021-10-24 ENCOUNTER — Ambulatory Visit (INDEPENDENT_AMBULATORY_CARE_PROVIDER_SITE_OTHER): Payer: Medicaid Other | Admitting: Family

## 2021-10-24 ENCOUNTER — Encounter: Payer: Self-pay | Admitting: Family

## 2021-10-24 VITALS — BP 90/60 | HR 67 | Temp 98.1°F | Ht 64.0 in | Wt 105.8 lb

## 2021-10-24 DIAGNOSIS — M25512 Pain in left shoulder: Secondary | ICD-10-CM

## 2021-10-24 DIAGNOSIS — G4700 Insomnia, unspecified: Secondary | ICD-10-CM

## 2021-10-24 DIAGNOSIS — Z9282 Status post administration of tPA (rtPA) in a different facility within the last 24 hours prior to admission to current facility: Secondary | ICD-10-CM | POA: Insufficient documentation

## 2021-10-24 DIAGNOSIS — R2990 Unspecified symptoms and signs involving the nervous system: Secondary | ICD-10-CM

## 2021-10-24 DIAGNOSIS — F411 Generalized anxiety disorder: Secondary | ICD-10-CM | POA: Diagnosis not present

## 2021-10-24 DIAGNOSIS — G8929 Other chronic pain: Secondary | ICD-10-CM

## 2021-10-24 DIAGNOSIS — R55 Syncope and collapse: Secondary | ICD-10-CM

## 2021-10-24 HISTORY — DX: Status post administration of tPA (rtPA) in a different facility within the last 24 hours prior to admission to current facility: Z92.82

## 2021-10-24 MED ORDER — TRAZODONE HCL 100 MG PO TABS: 100.0000 mg | ORAL_TABLET | Freq: Every evening | ORAL | 3 refills | Status: DC | PRN

## 2021-10-24 MED ORDER — DICLOFENAC SODIUM 1 % EX GEL: 2.0000 g | Freq: Four times a day (QID) | CUTANEOUS | 0 refills | Status: DC

## 2021-10-24 MED ORDER — SERTRALINE HCL 100 MG PO TABS: 100.0000 mg | ORAL_TABLET | Freq: Every day | ORAL | 3 refills | Status: DC

## 2021-10-24 NOTE — Progress Notes (Signed)
?Misty Beck is a 51 y.o. female with the following history as recorded in EpicCare:  ?Patient Active Problem List  ? Diagnosis Date Noted  ? Stroke-like symptoms 10/24/2021  ? Received intravenous tissue plasminogen activator (tPA) in emergency department 10/24/2021  ? History of migraine headaches 09/22/2021  ? Insomnia 09/22/2021  ? Chronic left shoulder pain 06/15/2020  ? Bipolar I disorder, single manic episode ([place]) 12/12/2012  ? Depressive disorder, not elsewhere classified 12/12/2012  ? Anxiety state 10/14/2012  ?  ?Current Outpatient Medications  ?Medication Sig Dispense Refill  ? diphenoxylate-atropine (LOMOTIL) 2.5-0.025 MG tablet Take 1 tablet by mouth 4 (four) times daily as needed for up to 30 doses for diarrhea or loose stools. 30 tablet 0  ? LORazepam (ATIVAN) 0.5 MG tablet Take 0.5 mg by mouth every 8 (eight) hours.    ? ondansetron (ZOFRAN-ODT) 4 MG disintegrating tablet Take 1 tablet (4 mg total) by mouth every 8 (eight) hours as needed for nausea or vomiting. 20 tablet 0  ? diclofenac Sodium (VOLTAREN) 1 % GEL Apply 2 g topically 4 (four) times daily. 100 g 0  ? dicyclomine (BENTYL) 10 MG capsule Take 1 capsule (10 mg total) by mouth every 6 (six) hours as needed for spasms. (Patient not taking: Reported on 09/22/2021) 60 capsule 3  ? sertraline (ZOLOFT) 100 MG tablet Take 1 tablet (100 mg total) by mouth daily. 90 tablet 3  ? traZODone (DESYREL) 100 MG tablet Take 1 tablet (100 mg total) by mouth at bedtime as needed for sleep. 90 tablet 3  ? ?No current facility-administered medications for this visit.  ?  ?Allergies: Patient has no known allergies.  ?Past Medical History:  ?Diagnosis Date  ? Asthma   ? Depression with anxiety 04/20/2021  ? Referral source Dr Doristine Section bonsu  ? Diarrhea   ? referral source  ? Hyperlipidemia   ? referral source  ? Insomnia 04/20/2021  ? referral source Dr Gayleen Orem  ? Stroke [facility])   ? Dec, 2021, Jan 2022  ?  ?Past Surgical History:  ?Procedure Laterality  Date  ? APPENDECTOMY  1992  ? left shoulder surgery    ? referral source Dr Roney Jaffe  ? PARTIAL HYSTERECTOMY    ? 2009  ?  ?Family History  ?Problem Relation Age of Onset  ? Stroke Mother   ? Cancer Mother   ? Colon polyps Mother   ? Skin cancer Mother   ? Breast cancer Mother 28  ? Kidney disease Father   ? Hyperlipidemia Father   ? COPD Father   ? Heart disease Father   ? Colon cancer Neg Hx   ? Rectal cancer Neg Hx   ?  ?Social History  ? ?Tobacco Use  ? Smoking status: Former  ?  Types: Cigarettes  ? Smokeless tobacco: Never  ?Substance Use Topics  ? Alcohol use: Not Currently  ?  ?Subjective:  ? ?Patient was seen as new patient at the end of February; she was sent to the ER at time of OV due to concerns for recurrent syncopal episodes.  ?ER provider felt she was dehydrated and she was sent home; patient notes she has had a few more episodes of feeling like she was going to pass out but she is not interested in pursuing any type of work up at this time; she does have questionable history of possible stroke but she feels this is due to COVID vaccine; in reviewing previous records, she was instructed to  see neurology in follow up in January 2022 but she did not complete this work up; she again notes today she does not want to consider further work up; ? ?She is asking for refills of her Ativan, Zoloft and Trazodone and Voltaren gel; per patient, she has been stable on Zoloft and Trazodone for 20+ years; notes Ativan was last filled in 2021- no records available for review;  ? ?Chronic shoulder pain- requesting referral back to orthopedics; was under care of pain management at one time for shoulder pain/ had successful nerve block but does not want to to do this treatment again;  ? ? ? ? ?Objective:  ?Vitals:  ? 10/24/21 1530  ?BP: 90/60  ?Pulse: 67  ?Temp: 98.1 ?F (36.7 ?C)  ?TempSrc: Oral  ?SpO2: 95%  ?Weight: 105 lb 12.8 oz (48 kg)  ?Height: '5\' 4"'$  (1.626 m)  ?  ?General: Well developed, well nourished, in no  acute distress  ?Skin : Warm and dry.  ?Head: Normocephalic and atraumatic  ?Lungs: Respirations unlabored; clear to auscultation bilaterally without wheeze, rales, rhonchi  ?CVS exam: normal rate and regular rhythm.  ?Neurologic: Alert and oriented; speech intact; face symmetrical; moves all extremities well; CNII-XII intact without focal deficit  ? ?Assessment:  ?1. Chronic left shoulder pain   ?2. Anxiety state   ?3. Insomnia, unspecified type   ?4. Stroke-like symptoms   ?5. Syncope, unspecified syncope type   ?  ?Plan:  ?Refer back to orthopedics; they can determine if other treatment options available; refill updated on Voltaren gel; ?Refill updated on Sertraline; do not feel comfortable refilling Ativan at this time- she will get records from previous provider; ?Refill updated on Trazodone; ?Patient defers seeing neurology at this time; ?Patient defers seeing neurology or cardiology at this time;  ? ?Per patient, she had labs with her former PCP in the past year and was told that labs were normal; have asked her to get [place] copies of those results as well;  ?Follow up in 1 year, sooner prn.  ? ?This visit occurred during the SARS-CoV-2 public health emergency.  Safety protocols were in place, including screening questions prior to the visit, additional usage of staff PPE, and extensive cleaning of exam room while observing appropriate contact time as indicated for disinfecting solutions.  ? ? ? ? ?No follow-ups on file.  ?Orders Placed This Encounter  ?Procedures  ? Ambulatory referral to Orthopedic Surgery  ?  Referral Priority:   Routine  ?  Referral Type:   Surgical  ?  Referral Reason:   Specialty Services Required  ?  Requested Specialty:   Orthopedic Surgery  ?  Number of Visits Requested:   1  ?  ?Requested Prescriptions  ? ?Signed Prescriptions Disp Refills  ? sertraline (ZOLOFT) 100 MG tablet 90 tablet 3  ?  Sig: Take 1 tablet (100 mg total) by mouth daily.  ? traZODone (DESYREL) 100 MG tablet 90  tablet 3  ?  Sig: Take 1 tablet (100 mg total) by mouth at bedtime as needed for sleep.  ? diclofenac Sodium (VOLTAREN) 1 % GEL 100 g 0  ?  Sig: Apply 2 g topically 4 (four) times daily.  ?  ? ?

## 2021-11-08 ENCOUNTER — Encounter: Payer: Self-pay | Admitting: Orthopedic Surgery

## 2021-11-08 ENCOUNTER — Ambulatory Visit: Payer: Self-pay

## 2021-11-08 ENCOUNTER — Ambulatory Visit (INDEPENDENT_AMBULATORY_CARE_PROVIDER_SITE_OTHER): Payer: Medicaid Other

## 2021-11-08 ENCOUNTER — Ambulatory Visit (INDEPENDENT_AMBULATORY_CARE_PROVIDER_SITE_OTHER): Payer: Medicaid Other | Admitting: Orthopedic Surgery

## 2021-11-08 DIAGNOSIS — M25512 Pain in left shoulder: Secondary | ICD-10-CM | POA: Diagnosis not present

## 2021-11-08 DIAGNOSIS — M25562 Pain in left knee: Secondary | ICD-10-CM | POA: Diagnosis not present

## 2021-11-08 DIAGNOSIS — G8929 Other chronic pain: Secondary | ICD-10-CM

## 2021-11-08 DIAGNOSIS — M25561 Pain in right knee: Secondary | ICD-10-CM

## 2021-11-08 NOTE — Progress Notes (Signed)
? ?Office Visit Note ?  ?Patient: Misty Beck           ?Date of Birth: October 07, 1970           ?MRN: [id number] ?Visit Date: 11/08/2021 ?Requested by: Marrian Salvage, [place] ?[address] ?[address] ?[place],  [place] [address] ?PCP: Marrian Salvage, [place] ? ?Subjective: ?Chief Complaint  ?Patient presents with  ? Right Knee - Pain  ? Left Knee - Pain  ? Left Shoulder - Pain  ? ? ?HPI: Cicilia is a 51 year old patient with left shoulder pain.  She is left-hand dominant.  Has some continued issues from prior injury.  Reports decreased strength in the left upper extremity with pain that wakes her from sleep at night.  Unable to use the left arm to drive at times.  She has tried a nerve block without relief over the last 2 years.  Denies much in the way of neck pain.  She does describe some numbness and tingling radiating into the left upper extremity.  She has had 2 prior surgeries for what she describes as rotator cuff tear and labral tear in 2019 and 2020.  Records are not available but the best I can tell this he had some type of surgery and later had another surgery for frozen shoulder.  She did do physical therapy for a year recently but cannot lift more than 3 pounds.  Pain is a bigger problem for her than weakness.  She did go to pain management.  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  Denies any fevers or chills.  Has not had an MRI scan within the last year. ? ?Patient also reports bilateral knee pain for 9 years.  She states "I do not have cartilage".  Reports stiffness locking popping.  She had a left knee injury and a fall in 2014. ?             ?ROS: All systems reviewed are negative as they relate to the chief complaint within the history of present illness.  Patient denies  fevers or chills. ? ? ?Assessment & Plan: ?Visit Diagnoses:  ?1. Chronic pain of both knees   ?2. Left shoulder pain, unspecified chronicity   ? ? ?Plan:  Impression is bilateral knee pain with normal radiographs and normal exam.  No physical exam evidence of ligament or meniscal damage.  I think that something we can watch.  Regarding the left shoulder she has had history of prior surgery on that shoulder.  Could have a mechanical or structural problem from the surgery but at this time she does have some limitation of motion on that left-hand side.  She may or may not have a fixable problem in the left shoulder.  She has had physical therapy for at least a year ending within the last year.  To make a good assessment on what is happening in the shoulder we need MRI arthrogram of the left shoulder to evaluate her prior rotator cuff tear repair.  Follow-up after that study.  No intervention or further work-up indicated on the knees at this time. ? ?Follow-Up Instructions: No follow-ups on file.  ? ?Orders:  ?Orders Placed This Encounter  ?Procedures  ? XR Knee 1-2 Views Right  ? XR KNEE 3 VIEW LEFT  ? XR Shoulder Left  ? MR Shoulder Left w/ contrast  ? Arthrogram  ? ?No orders of  the defined types were placed in this encounter. ? ? ? ? Procedures: ?No procedures performed ? ? ?Clinical Data: ?No additional findings. ? ?Objective: ?Vital Signs: There were no vitals taken for this visit. ? ?Physical Exam:  ? ?Constitutional: Patient appears well-developed ?HEENT:  ?Head: Normocephalic ?Eyes:EOM are normal ?Neck: Normal range of motion ?Cardiovascular: Normal rate ?Pulmonary/chest: Effort normal ?Neurologic: Patient is alert ?Skin: Skin is warm ?Psychiatric: Patient has normal mood and affect ? ? ?Ortho Exam: Ortho exam demonstrates full active and passive range of motion of the cervical spine.  Passive motion on the right is 70/110/175.  On the left at 60/90/120.  Rotator cuff strength is actually pretty reasonable in left infraspinatus supraspinatus subscap muscle testing.  She does have a little coarseness with internal extra rotation 9 degrees of abduction on the left  compared to the right.  O'Brien's testing negative on the left and right-hand side.  No Popeye deformity on the left-hand side.  Bilateral knees are examined.  Full active and passive range of motion with stable collateral cruciate ligaments bilaterally.  Negative patellar apprehension.  No masses lymphadenopathy or skin changes noted in the bilateral knee region. ? ?Specialty Comments:  ?No specialty comments available. ? ?Imaging: ?XR Knee 1-2 Views Right ? ?Result Date: 11/08/2021 ?AP lateral merchant radiographs right knee reviewed.  Alignment intact.  No acute fracture.  Minimal degenerative joint disease in the medial lateral and patellofemoral compartment.  Patella height normal relative to distal femur. ? ?XR KNEE 3 VIEW LEFT ? ?Result Date: 11/08/2021 ?AP lateral merchant radiographs left knee reviewed.  No acute fracture.  Alignment intact.  Minimal degenerative joint disease present in the medial lateral and patellofemoral compartments. ? ?XR Shoulder Left ? ?Result Date: 11/08/2021 ?AP axillary outlet radiographs left shoulder reviewed.  Some narrowing of the acromiohumeral interval is present.  No glenohumeral joint or AC joint arthritis.  Shoulder is located.  No acute fracture.  Visualized lung fields clear.  ? ? ?PMFS History: ?Patient Active Problem List  ? Diagnosis Date Noted  ? Stroke-like symptoms 10/24/2021  ? Received intravenous tissue plasminogen activator (tPA) in emergency department 10/24/2021  ? History of migraine headaches 09/22/2021  ? Insomnia 09/22/2021  ? Chronic left shoulder pain 06/15/2020  ? Bipolar I disorder, single manic episode ([place]) 12/12/2012  ? Depressive disorder, not elsewhere classified 12/12/2012  ? Anxiety state 10/14/2012  ? ?Past Medical History:  ?Diagnosis Date  ? Asthma   ? Depression with anxiety 04/20/2021  ? Referral source Dr Doristine Section bonsu  ? Diarrhea   ? referral source  ? Hyperlipidemia   ? referral source  ? Insomnia 04/20/2021  ? referral source Dr  Gayleen Orem  ? Stroke [facility])   ? Dec, 2021, Jan 2022  ?  ?Family History  ?Problem Relation Age of Onset  ? Stroke Mother   ? Cancer Mother   ? Colon polyps Mother   ? Skin cancer Mother   ? Breast cancer Mother 55  ? Kidney disease Father   ? Hyperlipidemia Father   ? COPD Father   ? Heart disease Father   ? Colon cancer Neg Hx   ? Rectal cancer Neg Hx   ?  ?Past Surgical History:  ?Procedure Laterality Date  ? APPENDECTOMY  1992  ? left shoulder surgery    ? referral source Dr Roney Jaffe  ? PARTIAL HYSTERECTOMY    ? 2009  ? ?Social History  ? ?Occupational History  ? Not on file  ?Tobacco  Use  ? Smoking status: Former  ?  Types: Cigarettes  ? Smokeless tobacco: Never  ?Substance and Sexual Activity  ? Alcohol use: Not Currently  ? Drug use: Not Currently  ? Sexual activity: Not on file  ? ? ? ? ? ?

## 2021-11-17 ENCOUNTER — Telehealth: Payer: Self-pay | Admitting: Orthopedic Surgery

## 2021-11-17 NOTE — Telephone Encounter (Signed)
Called patient left message to return call to schedule an appointment with Dr dean for MRI review ?

## 2021-11-21 ENCOUNTER — Ambulatory Visit
Admission: RE | Admit: 2021-11-21 | Discharge: 2021-11-21 | Disposition: A | Discharge status: Home / Self Care | Payer: Medicaid Other | Source: Ambulatory Visit | Attending: Orthopedic Surgery | Admitting: Orthopedic Surgery

## 2021-11-21 DIAGNOSIS — M25512 Pain in left shoulder: Secondary | ICD-10-CM

## 2021-11-21 MED ORDER — IOPAMIDOL (ISOVUE-M 200) INJECTION 41%
15.0000 mL | Freq: Once | INTRAMUSCULAR | Status: AC
  Administered 2021-11-21: 15 mL via INTRA_ARTICULAR

## 2021-12-06 ENCOUNTER — Ambulatory Visit (INDEPENDENT_AMBULATORY_CARE_PROVIDER_SITE_OTHER): Payer: Medicaid Other | Admitting: Orthopedic Surgery

## 2021-12-06 ENCOUNTER — Ambulatory Visit (INDEPENDENT_AMBULATORY_CARE_PROVIDER_SITE_OTHER): Payer: Medicaid Other

## 2021-12-06 ENCOUNTER — Encounter: Payer: Self-pay | Admitting: Orthopedic Surgery

## 2021-12-06 DIAGNOSIS — M542 Cervicalgia: Secondary | ICD-10-CM

## 2021-12-06 MED ORDER — PREDNISONE 5 MG (21) PO TBPK: ORAL_TABLET | ORAL | 0 refills | Status: DC

## 2021-12-06 MED ORDER — METHOCARBAMOL 500 MG PO TABS: 500.0000 mg | ORAL_TABLET | Freq: Three times a day (TID) | ORAL | 0 refills | Status: DC | PRN

## 2021-12-06 NOTE — Progress Notes (Signed)
? ?Office Visit Note ?  ?Patient: Misty Beck           ?Date of Birth: 28-May-1971           ?MRN: [id number] ?Visit Date: 12/06/2021 ?Requested by: Marrian Salvage, [place] ?[address] ?[address] ?[place],  [place] [address] ?PCP: Marrian Salvage, [place] ? ?Subjective: ?Chief Complaint  ?Patient presents with  ? Other  ?   ?Scan review  ? ? ?HPI: Patient presents for follow-up of MRI scan left shoulder.  Since she was last seen she reports onset of scapular pain with radiation into the neck.  Hurts for her to turn her head to the left.  She has tried some Norco from a friend and it has not helped.  She also has tried ice and heat.  Uses ibuprofen and gabapentin as well.  No fevers or chills.  Overall she has pain which is significant as well as night pain.  Denies any weakness in the arm but does report pain with range of motion. ? ?MRI scan of the shoulder is reviewed.  This shows remote anterior supraspinatus rotator cuff repair.  No definite full-thickness retear present.  Biceps tenodesis performed.  Fairly minimal glenohumeral joint degenerative changes. ?             ?ROS: All systems reviewed are negative as they relate to the chief complaint within the history of present illness.  Patient denies  fevers or chills. ? ? ?Assessment & Plan: ?Visit Diagnoses:  ?1. Neck pain   ? ? ?Plan: Impression is significant and worsening left forequarter pain which appears to have more of a radicular component today compared to last office visit.  In general the MRI scan of the shoulder does not really match the severity of symptoms she is describing on the day and the day out basis.  No compelling physical exam or MRI evidence of infection in the shoulder.  I think this is most likely radiculopathy.  Based on severity of symptoms recommend MRI scan of the cervical spine to evaluate for left-sided radiculopathy.  Follow-up after that study.  Medrol Dosepak and Robaxin prescribed ? ?Follow-Up Instructions:  Return for after MRI.  ? ?Orders:  ?Orders Placed This Encounter  ?Procedures  ? XR Cervical Spine 2 or 3 views  ? MR Cervical Spine w/o contrast  ? ?Meds ordered this encounter  ?Medications  ? methocarbamol (ROBAXIN) 500 MG tablet  ?  Sig: Take 1 tablet (500 mg total) by mouth every 8 (eight) hours as needed for muscle spasms.  ?  Dispense:  35 tablet  ?  Refill:  0  ? predniSONE (STERAPRED UNI-PAK 21 TAB) 5 MG (21) TBPK tablet  ?  Sig: Take dose pack as directed  ?  Dispense:  21 tablet  ?  Refill:  0  ? ? ? ? Procedures: ?No procedures performed ? ? ?Clinical Data: ?No additional findings. ? ?Objective: ?Vital Signs: There were no vitals taken for this visit. ? ?Physical Exam:  ? ?Constitutional: Patient appears well-developed ?HEENT:  ?Head: Normocephalic ?Eyes:EOM are normal ?Neck: Normal range of motion ?Cardiovascular: Normal rate ?Pulmonary/chest: Effort normal ?Neurologic: Patient is alert ?Skin: Skin is warm ?Psychiatric: Patient has normal mood and affect ? ? ?Ortho Exam: Ortho exam demonstrates 5 out of 5 grip EPL FPL interosseous wrist flexion extension bicep triceps and deltoid strength.  Passive range of motion of the shoulder is painful on the left compared to the right.  Patient does report reproduced radiation of pain into the scapula and shoulder with rotation of her head to the left but not the right.  Rotator cuff strength is pretty reasonable to infraspinatus supraspinatus and subscap muscle testing.  No Popeye deformity noted in that left arm versus right.  Radial pulse intact bilaterally.  No lymphadenopathy in that left shoulder girdle region.  No scapular dyskinesia with forward flexion or protraction of the arm. ? ?Specialty Comments:  ?No specialty comments available. ? ?Imaging: ?XR Cervical Spine 2 or 3 views ? ?Result Date: 12/06/2021 ?AP lateral cervical spine radiographs reviewed.  Mild loss of lordosis present.  Mild facet arthritis present in the lower cervical spine regions.  No  spondylolisthesis or compression fractures.  Overall minimal degenerative disc disease between the vertebral bodies.  ? ? ?PMFS History: ?Patient Active Problem List  ? Diagnosis Date Noted  ? Stroke-like symptoms 10/24/2021  ? Received intravenous tissue plasminogen activator (tPA) in emergency department 10/24/2021  ? History of migraine headaches 09/22/2021  ? Insomnia 09/22/2021  ? Chronic left shoulder pain 06/15/2020  ? Bipolar I disorder, single manic episode ([place]) 12/12/2012  ? Depressive disorder, not elsewhere classified 12/12/2012  ? Anxiety state 10/14/2012  ? ?Past Medical History:  ?Diagnosis Date  ? Asthma   ? Depression with anxiety 04/20/2021  ? Referral source Dr Doristine Section bonsu  ? Diarrhea   ? referral source  ? Hyperlipidemia   ? referral source  ? Insomnia 04/20/2021  ? referral source Dr Gayleen Orem  ? Stroke [facility])   ? Dec, 2021, Jan 2022  ?  ?Family History  ?Problem Relation Age of Onset  ? Stroke Mother   ? Cancer Mother   ? Colon polyps Mother   ? Skin cancer Mother   ? Breast cancer Mother 15  ? Kidney disease Father   ? Hyperlipidemia Father   ? COPD Father   ? Heart disease Father   ? Colon cancer Neg Hx   ? Rectal cancer Neg Hx   ?  ?Past Surgical History:  ?Procedure Laterality Date  ? APPENDECTOMY  1992  ? left shoulder surgery    ? referral source Dr Roney Jaffe  ? PARTIAL HYSTERECTOMY    ? 2009  ? ?Social History  ? ?Occupational History  ? Not on file  ?Tobacco Use  ? Smoking status: Former  ?  Types: Cigarettes  ? Smokeless tobacco: Never  ?Substance and Sexual Activity  ? Alcohol use: Not Currently  ? Drug use: Not Currently  ? Sexual activity: Not on file  ? ? ? ? ? ?

## 2021-12-18 ENCOUNTER — Ambulatory Visit
Admission: RE | Admit: 2021-12-18 | Discharge: 2021-12-18 | Disposition: A | Discharge status: Home / Self Care | Payer: Medicaid Other | Source: Ambulatory Visit | Attending: Orthopedic Surgery | Admitting: Orthopedic Surgery

## 2021-12-18 DIAGNOSIS — M542 Cervicalgia: Secondary | ICD-10-CM

## 2022-02-13 ENCOUNTER — Other Ambulatory Visit: Payer: Self-pay | Admitting: Family

## 2022-04-12 ENCOUNTER — Telehealth: Payer: Self-pay | Admitting: Family

## 2022-04-12 NOTE — Telephone Encounter (Signed)
Pt called stating that she would like to facilitate a transfer of care from Mickel Baas to [facility] [place]. Pt was advised that we don't normally do TOCs within the same office but asked why she was looking to change. Pt stated that "Mickel Baas is very rude and her bedside manner is very bad. She seems to argue at everything I have to say." Pt was advised that a note would be sent back to look into this approval. Pt acknowledged understanding.  Is TOC approved for this pt?

## 2022-04-12 NOTE — Telephone Encounter (Signed)
OK 

## 2022-04-13 NOTE — Telephone Encounter (Signed)
Pt scheduled for TOC appt

## 2022-04-16 ENCOUNTER — Encounter: Payer: Self-pay | Admitting: Family

## 2022-04-16 ENCOUNTER — Telehealth: Payer: Self-pay | Admitting: Family

## 2022-04-16 ENCOUNTER — Ambulatory Visit (INDEPENDENT_AMBULATORY_CARE_PROVIDER_SITE_OTHER): Payer: Medicaid Other | Admitting: Family

## 2022-04-16 VITALS — BP 107/67 | HR 76 | Temp 98.1°F | Resp 16 | Wt 100.0 lb

## 2022-04-16 DIAGNOSIS — R197 Diarrhea, unspecified: Secondary | ICD-10-CM | POA: Insufficient documentation

## 2022-04-16 DIAGNOSIS — K529 Noninfective gastroenteritis and colitis, unspecified: Secondary | ICD-10-CM

## 2022-04-16 DIAGNOSIS — F411 Generalized anxiety disorder: Secondary | ICD-10-CM

## 2022-04-16 DIAGNOSIS — R232 Flushing: Secondary | ICD-10-CM | POA: Insufficient documentation

## 2022-04-16 DIAGNOSIS — Z79899 Other long term (current) drug therapy: Secondary | ICD-10-CM | POA: Diagnosis not present

## 2022-04-16 DIAGNOSIS — G4700 Insomnia, unspecified: Secondary | ICD-10-CM

## 2022-04-16 DIAGNOSIS — R110 Nausea: Secondary | ICD-10-CM | POA: Insufficient documentation

## 2022-04-16 DIAGNOSIS — F32A Depression, unspecified: Secondary | ICD-10-CM

## 2022-04-16 DIAGNOSIS — Z8673 Personal history of transient ischemic attack (TIA), and cerebral infarction without residual deficits: Secondary | ICD-10-CM | POA: Insufficient documentation

## 2022-04-16 HISTORY — DX: Personal history of transient ischemic attack (TIA), and cerebral infarction without residual deficits: Z86.73

## 2022-04-16 HISTORY — DX: Flushing: R23.2

## 2022-04-16 HISTORY — DX: Nausea: R11.0

## 2022-04-16 LAB — LIPID PANEL
Cholesterol: 151 mg/dL (ref 0–200)
HDL: 52.9 mg/dL (ref >39.00)
LDL Cholesterol: 69 mg/dL (ref 0–99)
NonHDL: 97.96
Total CHOL/HDL Ratio: 3
Triglycerides: 145 mg/dL (ref 0.0–149.0)
VLDL: 29 mg/dL (ref 0.0–40.0)

## 2022-04-16 MED ORDER — DIPHENOXYLATE-ATROPINE 2.5-0.025 MG PO TABS: 1.0000 | ORAL_TABLET | Freq: Four times a day (QID) | ORAL | 0 refills | Status: DC | PRN

## 2022-04-16 MED ORDER — DICLOFENAC SODIUM 1 % EX GEL: CUTANEOUS | 1 refills | Status: DC

## 2022-04-16 MED ORDER — ONDANSETRON 4 MG PO TBDP: 4.0000 mg | ORAL_TABLET | Freq: Three times a day (TID) | ORAL | 0 refills | Status: DC | PRN

## 2022-04-16 MED ORDER — GABAPENTIN 100 MG PO CAPS: 100.0000 mg | ORAL_CAPSULE | Freq: Three times a day (TID) | ORAL | 1 refills | Status: DC

## 2022-04-16 MED ORDER — ASPIRIN 81 MG PO TBEC
81.0000 mg | DELAYED_RELEASE_TABLET | Freq: Every day | ORAL | 12 refills | Status: DC
Start: 2022-04-16 — End: 2022-06-12

## 2022-04-16 NOTE — Assessment & Plan Note (Signed)
Pt has responded well to lomotil prn. Refill provided. Controlled substance contract signed today.

## 2022-04-16 NOTE — Assessment & Plan Note (Signed)
We discussed strategies for secondary stroke prevention. She is not on aspirin or statin. I advised her to add aspirin '81mg'$  once daily. Check lipid panel, will likely add statin- she is agreeable to this.

## 2022-04-16 NOTE — Assessment & Plan Note (Signed)
Stable with rare use of ativan. Obtain UDS, controlled substance contract is updated.

## 2022-04-16 NOTE — Telephone Encounter (Signed)
Will, Pharmacist with [facility] calling to discuss drug interaction between ondansetron (sent in today) with sertraline and trazdone. Please call him back at [phone].

## 2022-04-16 NOTE — Patient Instructions (Signed)
Please complete lab work prior to leaving.   

## 2022-04-16 NOTE — Assessment & Plan Note (Signed)
Reports symptoms have been stable on sertraline. Continue same.

## 2022-04-16 NOTE — Assessment & Plan Note (Signed)
Uncontrolled. Not a candidate for HRT given her stroke history. Recommended trial of gabapentin.

## 2022-04-16 NOTE — Progress Notes (Signed)
Subjective:   By signing my name below, I, [name], attest that this documentation has been prepared under the direction and in the presence of [name], NP 04/16/2022   Patient ID: Misty Beck, female    DOB: 25-Jul-1971, 51 y.o.   MRN: [id number]  Chief Complaint  Patient presents with   Hot Flashes    Complaining of hot flashes for the past 2 weeks   Nausea    Complains of nausea with occasional vomiting for about one week    HPI Patient is in today for an office visit  Refill: She is requesting a refill of 1 % Gel of Voltaren. Hot Flashes: She complains of hot flashes that occurred a couple of weeks ago. She experiences night sweats. She is currently taking 100 Mg of Sertraline.  Stroke: She has a history of stroke in 2021 and 2022. She believes that the Covid-19 vaccine precipitated her stroke. She reports residual symptoms of poor balance, speech problems and memory loss. She previously had a neurologist a long time ago but not since her stroke. She does not take Aspirin.  Blood Pressure: As of today's visit, her blood pressure is normal.  BP Readings from Last 3 Encounters:  04/16/22 107/67  10/24/21 90/60  09/22/21 109/69   Pulse Readings from Last 3 Encounters:  04/16/22 76  10/24/21 67  09/22/21 73   Cholesterol: She reports that her cholesterol levels were tested. She would be open to starting a statin medication if cholesterol levels are abnormal.  Depression: She reports that her symptoms are controlled. She is currently taking 100 Mg of Zoloft.  Sleep: She is currently taking 100 Mg of Trazodone Anxiety: She takes 0.5 Mg of Lorazepam PRN Nausea: She is currently taking 4 Mg of Zofran-ODT. She states that she would throw up and experience diarrhea but does not know the reason for her symptoms. She consumes one coffee a day. She does not consume much sugar, caffeine or meat.  The medication helps alleviate her pain. She states that she does have  her gallbladder intact. She also states that her mother has had her gallbladder removed.  Anemic: She reports that she was anemic. She has tried to take iron tablets in the past and reports that the supplements made her sick.   Health Maintenance Due  Topic Date Due   HIV Screening  Never done   Hepatitis C Screening  Never done   COVID-19 Vaccine (3 - Pfizer series) 05/25/2020   INFLUENZA VACCINE  Never done    Past Medical History:  Diagnosis Date   Asthma    Depression with anxiety 04/20/2021   Referral source Dr Doristine Section bonsu   Diarrhea    referral source   Hyperlipidemia    referral source   Insomnia 04/20/2021   referral source Dr Gayleen Orem   Received intravenous tissue plasminogen activator (tPA) in emergency department 10/24/2021   Stroke [facility])    Dec, 2021, Jan 2022    Past Surgical History:  Procedure Laterality Date   APPENDECTOMY  1992   left shoulder surgery     referral source Dr Roney Jaffe   PARTIAL HYSTERECTOMY     2009    Family History  Problem Relation Age of Onset   Stroke Mother    Cancer Mother    Colon polyps Mother    Skin cancer Mother    Breast cancer Mother 10   Kidney disease Father    Hyperlipidemia Father  COPD Father    Heart disease Father    Colon cancer Neg Hx    Rectal cancer Neg Hx     Social History   Socioeconomic History   Marital status: Divorced    Spouse name: Not on file   Number of children: 2   Years of education: Not on file   Highest education level: Not on file  Occupational History   Not on file  Tobacco Use   Smoking status: Former    Types: Cigarettes   Smokeless tobacco: Never  Substance and Sexual Activity   Alcohol use: Not Currently   Drug use: Not Currently   Sexual activity: Not on file  Other Topics Concern   Not on file  Social History Narrative   Not on file   Social Determinants of Health   Financial Resource Strain: Not on file  Food Insecurity: Not on file   Transportation Needs: Not on file  Physical Activity: Not on file  Stress: Not on file  Social Connections: Not on file  Intimate Partner Violence: Not on file    Outpatient Medications Prior to Visit  Medication Sig Dispense Refill   dicyclomine (BENTYL) 10 MG capsule Take 1 capsule (10 mg total) by mouth every 6 (six) hours as needed for spasms. 60 capsule 3   LORazepam (ATIVAN) 0.5 MG tablet Take 0.5 mg by mouth every 8 (eight) hours.     methocarbamol (ROBAXIN) 500 MG tablet Take 1 tablet (500 mg total) by mouth every 8 (eight) hours as needed for muscle spasms. 35 tablet 0   sertraline (ZOLOFT) 100 MG tablet Take 1 tablet (100 mg total) by mouth daily. 90 tablet 3   traZODone (DESYREL) 100 MG tablet Take 1 tablet (100 mg total) by mouth at bedtime as needed for sleep. 90 tablet 3   diclofenac Sodium (VOLTAREN) 1 % GEL APPLY 2 GRAMS TOPICALLY 4 TIMES DAILY 100 g 0   diphenoxylate-atropine (LOMOTIL) 2.5-0.025 MG tablet Take 1 tablet by mouth 4 (four) times daily as needed for up to 30 doses for diarrhea or loose stools. 30 tablet 0   ondansetron (ZOFRAN-ODT) 4 MG disintegrating tablet Take 1 tablet (4 mg total) by mouth every 8 (eight) hours as needed for nausea or vomiting. 20 tablet 0   predniSONE (STERAPRED UNI-PAK 21 TAB) 5 MG (21) TBPK tablet Take dose pack as directed 21 tablet 0   No facility-administered medications prior to visit.    No Known Allergies  Review of Systems  Constitutional:        (+) Hot Flashes       Objective:    Physical Exam Constitutional:      General: She is not in acute distress.    Appearance: Normal appearance. She is not ill-appearing.  HENT:     Head: Normocephalic and atraumatic.     Right Ear: External ear normal.     Left Ear: External ear normal.  Eyes:     Extraocular Movements: Extraocular movements intact.     Pupils: Pupils are equal, round, and reactive to light.  Cardiovascular:     Rate and Rhythm: Normal rate and regular  rhythm.     Heart sounds: Normal heart sounds. No murmur heard.    No gallop.  Pulmonary:     Effort: Pulmonary effort is normal. No respiratory distress.     Breath sounds: Normal breath sounds. No wheezing or rales.  Abdominal:     General: Bowel sounds are normal. There is  no distension.     Palpations: Abdomen is soft.     Tenderness: There is abdominal tenderness in the epigastric area. There is no guarding.  Skin:    General: Skin is warm and dry.  Neurological:     Mental Status: She is alert and oriented to person, place, and time.  Psychiatric:        Mood and Affect: Mood normal.        Behavior: Behavior normal.        Judgment: Judgment normal.     BP 107/67 (BP Location: Right Arm, Patient Position: Sitting, Cuff Size: Small)   Pulse 76   Temp 98.1 F (36.7 C) (Oral)   Resp 16   Wt 100 lb (45.4 kg)   SpO2 98%   BMI 17.16 kg/m  Wt Readings from Last 3 Encounters:  04/16/22 100 lb (45.4 kg)  10/24/21 105 lb 12.8 oz (48 kg)  09/22/21 103 lb 9.6 oz (47 kg)       Assessment & Plan:   Problem List Items Addressed This Visit       Unprioritized   Insomnia    Stable with HS trazodone.       Hot flashes    Uncontrolled. Not a candidate for HRT given her stroke history. Recommended trial of gabapentin.       Relevant Medications   aspirin EC 81 MG tablet   History of CVA (cerebrovascular accident)    We discussed strategies for secondary stroke prevention. She is not on aspirin or statin. I advised her to add aspirin '81mg'$  once daily. Check lipid panel, will likely add statin- she is agreeable to this.       Relevant Medications   aspirin EC 81 MG tablet   Other Relevant Orders   Lipid panel   Depression    Reports symptoms have been stable on sertraline. Continue same.       Chronic nausea - Primary    Reports normal Endo/colo previously.  She has never had an evaluation of her gallbladder. She has some epigastric tenderness. Will obtain abdominal  ultrasound for further evaluation.       Relevant Orders   US Abdomen Complete   Chronic diarrhea    Pt has responded well to lomotil prn. Refill provided. Controlled substance contract signed today.       Anxiety state    Stable with rare use of ativan. Obtain UDS, controlled substance contract is updated.       Other Visit Diagnoses     High risk medication use       Relevant Orders   DRUG MONITORING, PANEL 8 WITH CONFIRMATION, URINE      Meds ordered this encounter  Medications   diclofenac Sodium (VOLTAREN) 1 % GEL    Sig: APPLY 2 GRAMS TOPICALLY 4 TIMES DAILY    Dispense:  400 g    Refill:  1   diphenoxylate-atropine (LOMOTIL) 2.5-0.025 MG tablet    Sig: Take 1 tablet by mouth 4 (four) times daily as needed for up to 30 doses for diarrhea or loose stools.    Dispense:  30 tablet    Refill:  0   ondansetron (ZOFRAN-ODT) 4 MG disintegrating tablet    Sig: Take 1 tablet (4 mg total) by mouth every 8 (eight) hours as needed for nausea or vomiting.    Dispense:  30 tablet    Refill:  0   aspirin EC 81 MG tablet    Sig: Take  1 tablet (81 mg total) by mouth daily. Swallow whole.    Dispense:  30 tablet    Refill:  12    Order Specific Question:   Supervising Provider    Answer:   Penni Homans A [[id number]]   gabapentin (NEURONTIN) 100 MG capsule    Sig: Take 1 capsule (100 mg total) by mouth 3 (three) times daily.    Dispense:  90 capsule    Refill:  1    Order Specific Question:   Supervising Provider    Answer:   Penni Homans A [[id number]]    I, Nance Pear, NP, personally preformed the services described in this documentation.  All medical record entries made by the scribe were at my direction and in my presence.  I have reviewed the chart and discharge instructions (if applicable) and agree that the record reflects my personal performance and is accurate and complete. 04/06/2022  40 minutes spent on today's visit. Time was spent interviewing patient, counseling  patient on secondary stroke prevention and reviewing outside records.   I,Amber Collins,acting as a Education administrator for [employer], NP.,have documented all relevant documentation on the behalf of Nance Pear, NP,as directed by  Nance Pear, NP while in the presence of Nance Pear, NP.    Nance Pear, NP

## 2022-04-16 NOTE — Assessment & Plan Note (Signed)
Stable with HS trazodone.  

## 2022-04-16 NOTE — Assessment & Plan Note (Signed)
Reports normal Endo/colo previously.  She has never had an evaluation of her gallbladder. She has some epigastric tenderness. Will obtain abdominal ultrasound for further evaluation.

## 2022-04-17 ENCOUNTER — Ambulatory Visit (HOSPITAL_BASED_OUTPATIENT_CLINIC_OR_DEPARTMENT_OTHER)
Admission: RE | Admit: 2022-04-17 | Discharge: 2022-04-17 | Disposition: A | Discharge status: Home / Self Care | Payer: Medicaid Other | Source: Ambulatory Visit | Attending: Family | Admitting: Family

## 2022-04-17 DIAGNOSIS — R110 Nausea: Secondary | ICD-10-CM | POA: Insufficient documentation

## 2022-04-17 NOTE — Telephone Encounter (Signed)
Pharmacist advised ok to fill rx

## 2022-04-17 NOTE — Telephone Encounter (Signed)
Pt has been stable on this regimen and I think benefit outweighs risk.  OK to fill.

## 2022-04-18 ENCOUNTER — Telehealth: Payer: Self-pay | Admitting: Family

## 2022-04-18 DIAGNOSIS — Z8673 Personal history of transient ischemic attack (TIA), and cerebral infarction without residual deficits: Secondary | ICD-10-CM

## 2022-04-18 LAB — DRUG MONITORING, PANEL 8 WITH CONFIRMATION, URINE
6 Acetylmorphine: NEGATIVE ng/mL (ref <10)
Alcohol Metabolites: POSITIVE ng/mL — AB (ref <500)
Amphetamines: NEGATIVE ng/mL (ref <500)
Benzodiazepines: NEGATIVE ng/mL (ref <100)
Buprenorphine, Urine: NEGATIVE ng/mL (ref <5)
Cocaine Metabolite: NEGATIVE ng/mL (ref <150)
Codeine: NEGATIVE ng/mL (ref <50)
Creatinine: 240.6 mg/dL (ref >=20.0)
Ethyl Glucuronide (ETG): 1087 ng/mL — ABNORMAL HIGH (ref <500)
Ethyl Sulfate (ETS): 425 ng/mL — ABNORMAL HIGH (ref <100)
Hydrocodone: NEGATIVE ng/mL (ref <50)
Hydromorphone: 76 ng/mL — ABNORMAL HIGH (ref <50)
MDMA: NEGATIVE ng/mL (ref <500)
Marijuana Metabolite: 139 ng/mL — ABNORMAL HIGH (ref <5)
Marijuana Metabolite: POSITIVE ng/mL — AB (ref <20)
Morphine: NEGATIVE ng/mL (ref <50)
Norhydrocodone: 319 ng/mL — ABNORMAL HIGH (ref <50)
Opiates: POSITIVE ng/mL — AB (ref <100)
Oxidant: NEGATIVE ug/mL (ref <200)
Oxycodone: NEGATIVE ng/mL (ref <100)
pH: 6.3 (ref 4.5–9.0)

## 2022-04-18 LAB — DM TEMPLATE

## 2022-04-18 MED ORDER — ATORVASTATIN CALCIUM 10 MG PO TABS: 10.0000 mg | ORAL_TABLET | Freq: Every day | ORAL | 1 refills | Status: DC

## 2022-04-18 NOTE — Telephone Encounter (Signed)
Please advise pt that her cholesterol looks good, however I still think it would be a good idea to add a low dose statin medication to decrease her future risk of stroke.  Rx sent for atorvastatin '10mg'$ . Rx was sent to [facility].

## 2022-04-19 NOTE — Telephone Encounter (Signed)
Patient notified of results, provider's comments and recommendations. She defers referral to GI at this time. She was advised of new medication sent to her pharmacy. She verbalized understanding instructions and advise.

## 2022-04-19 NOTE — Telephone Encounter (Addendum)
Abdominal ultrasound shows normal gallbladder.  Would she like referral to GI?     Also, please advise her that I reviewed her urine drug screen and unfortunately she tested positive for opiates and marijuana which should not be present based on controlled substance contract.  I will no longer be able to provide her with refills of controlled substances including lomotil and ativan.   Also, marijuana use can contribute to symptoms of stomach upset- I would recommend that she discontinue marijuana use.

## 2022-06-12 ENCOUNTER — Ambulatory Visit (HOSPITAL_BASED_OUTPATIENT_CLINIC_OR_DEPARTMENT_OTHER)
Admission: RE | Admit: 2022-06-12 | Discharge: 2022-06-12 | Disposition: A | Discharge status: Home / Self Care | Payer: Medicaid Other | Source: Ambulatory Visit | Attending: Family | Admitting: Family

## 2022-06-12 ENCOUNTER — Ambulatory Visit (INDEPENDENT_AMBULATORY_CARE_PROVIDER_SITE_OTHER): Payer: Medicaid Other | Admitting: Family

## 2022-06-12 VITALS — BP 102/68 | HR 79 | Temp 99.2°F | Resp 16 | Ht 64.0 in | Wt 98.8 lb

## 2022-06-12 DIAGNOSIS — I639 Cerebral infarction, unspecified: Secondary | ICD-10-CM | POA: Insufficient documentation

## 2022-06-12 DIAGNOSIS — L989 Disorder of the skin and subcutaneous tissue, unspecified: Secondary | ICD-10-CM | POA: Diagnosis not present

## 2022-06-12 DIAGNOSIS — R0789 Other chest pain: Secondary | ICD-10-CM

## 2022-06-12 DIAGNOSIS — M2170 Unequal limb length (acquired), unspecified site: Secondary | ICD-10-CM

## 2022-06-12 DIAGNOSIS — D649 Anemia, unspecified: Secondary | ICD-10-CM

## 2022-06-12 DIAGNOSIS — R634 Abnormal weight loss: Secondary | ICD-10-CM

## 2022-06-12 HISTORY — DX: Cerebral infarction, unspecified: I63.9

## 2022-06-12 HISTORY — DX: Abnormal weight loss: R63.4

## 2022-06-12 HISTORY — DX: Other chest pain: R07.89

## 2022-06-12 HISTORY — DX: Unequal limb length (acquired), unspecified site: M21.70

## 2022-06-12 LAB — COMPREHENSIVE METABOLIC PANEL
ALT: 20 U/L (ref 0–35)
AST: 15 U/L (ref 0–37)
Albumin: 4.2 g/dL (ref 3.5–5.2)
Alkaline Phosphatase: 50 U/L (ref 39–117)
BUN: 11 mg/dL (ref 6–23)
CO2: 30 mEq/L (ref 19–32)
Calcium: 9.4 mg/dL (ref 8.4–10.5)
Chloride: 102 mEq/L (ref 96–112)
Creatinine, Ser: 1.06 mg/dL (ref 0.40–1.20)
GFR: 60.25 mL/min (ref >60.00)
Glucose, Bld: 82 mg/dL (ref 70–99)
Potassium: 4.6 mEq/L (ref 3.5–5.1)
Sodium: 137 mEq/L (ref 135–145)
Total Bilirubin: 0.5 mg/dL (ref 0.2–1.2)
Total Protein: 6.5 g/dL (ref 6.0–8.3)

## 2022-06-12 LAB — TSH: TSH: 0.88 u[IU]/mL (ref 0.35–5.50)

## 2022-06-12 LAB — CBC WITH DIFFERENTIAL/PLATELET
Basophils Absolute: 0.1 10*3/uL (ref 0.0–0.1)
Basophils Relative: 0.9 % (ref 0.0–3.0)
Eosinophils Absolute: 0.2 10*3/uL (ref 0.0–0.7)
Eosinophils Relative: 3 % (ref 0.0–5.0)
HCT: 34.8 % — ABNORMAL LOW (ref 36.0–46.0)
Hemoglobin: 11.6 g/dL — ABNORMAL LOW (ref 12.0–15.0)
Lymphocytes Relative: 27.5 % (ref 12.0–46.0)
Lymphs Abs: 2.1 10*3/uL (ref 0.7–4.0)
MCHC: 33.3 g/dL (ref 30.0–36.0)
MCV: 89.9 fl (ref 78.0–100.0)
Monocytes Absolute: 0.7 10*3/uL (ref 0.1–1.0)
Monocytes Relative: 8.8 % (ref 3.0–12.0)
Neutro Abs: 4.5 10*3/uL (ref 1.4–7.7)
Neutrophils Relative %: 59.8 % (ref 43.0–77.0)
Platelets: 270 10*3/uL (ref 150.0–400.0)
RBC: 3.88 Mil/uL (ref 3.87–5.11)
RDW: 13.5 % (ref 11.5–15.5)
WBC: 7.5 10*3/uL (ref 4.0–10.5)

## 2022-06-12 LAB — HEMOGLOBIN A1C: Hgb A1c MFr Bld: 5.9 % (ref 4.6–6.5)

## 2022-06-12 MED ORDER — ASPIRIN 325 MG PO TBEC: 325.0000 mg | DELAYED_RELEASE_TABLET | Freq: Every day | ORAL | Status: DC

## 2022-06-12 MED ORDER — IOHEXOL 350 MG/ML SOLN
75.0000 mL | Freq: Once | INTRAVENOUS | Status: AC | PRN
  Administered 2022-06-12: 75 mL via INTRAVENOUS

## 2022-06-12 NOTE — Assessment & Plan Note (Signed)
New. Refer to cardiology for further evaluation. Advised pt to go to the ER if she develops severe chest pain or pain that does not improve on its own in a few minutes.

## 2022-06-12 NOTE — Assessment & Plan Note (Signed)
She believes that this contributes to her chronic back pain. Will refer to podiatry to see if they can help her with a shoe rise to correct this.

## 2022-06-12 NOTE — Assessment & Plan Note (Addendum)
Hx is suggestive of recurrent CVA about 9 days ago. She did not seek medical care. Now believes that she is at baseline. LDL is <70.  Check A1C. Increase aspirin from '81mg'$  to '325mg'$ .  Obtain CTA head w and wo. EKG tracing is personally reviewed.  EKG notes NSR.  No acute changes.  Check carotid doppler and 2D echo.  Pt is advised to call 911 if she develops any recurrent stroke like symptoms.

## 2022-06-12 NOTE — Assessment & Plan Note (Signed)
She reports this to poor appetite. Denies depression. She reports chronic diarrhea which has not improved. This has been evaluated by GI in the past.

## 2022-06-12 NOTE — Addendum Note (Signed)
Addended by: Debbrah Alar on: 06/12/2022 05:52 PM   Modules accepted: Orders
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
## 2022-06-13 ENCOUNTER — Telehealth: Payer: Self-pay | Admitting: Family

## 2022-06-13 NOTE — Telephone Encounter (Signed)
Also, she is mildly anemic.  Please place order for IFOB and advise pt to add multivitamin with minerals once daily .

## 2022-06-15 ENCOUNTER — Other Ambulatory Visit: Payer: Self-pay

## 2022-06-15 ENCOUNTER — Other Ambulatory Visit: Payer: Self-pay | Admitting: *Deleted

## 2022-06-15 DIAGNOSIS — D649 Anemia, unspecified: Secondary | ICD-10-CM

## 2022-06-15 NOTE — Telephone Encounter (Signed)
Patient advised of results and iprovider's instructions. fob package put upfront for her to pick up.

## 2022-06-17 ENCOUNTER — Telehealth: Payer: Self-pay | Admitting: Family

## 2022-06-17 NOTE — Telephone Encounter (Signed)
She is mildly anemic. I would like her to complete ifob  when she is able and add mvi with minerals, one tab once daily.

## 2022-06-18 NOTE — Telephone Encounter (Signed)
Patient advised of results and ifob up front for pick up

## 2022-06-25 NOTE — Telephone Encounter (Signed)
Melissa, It looks like the add on iron and ferritin levels never got added / processed. Do you want me to call pt to come in for lab appt to complete those tests?

## 2022-06-26 ENCOUNTER — Ambulatory Visit: Payer: Medicaid Other | Admitting: Podiatry

## 2022-06-27 ENCOUNTER — Ambulatory Visit (HOSPITAL_BASED_OUTPATIENT_CLINIC_OR_DEPARTMENT_OTHER)
Admission: RE | Admit: 2022-06-27 | Discharge: 2022-06-27 | Disposition: A | Discharge status: Home / Self Care | Payer: Medicaid Other | Source: Ambulatory Visit | Attending: Family | Admitting: Family

## 2022-06-27 DIAGNOSIS — R072 Precordial pain: Secondary | ICD-10-CM

## 2022-06-27 DIAGNOSIS — R0609 Other forms of dyspnea: Secondary | ICD-10-CM | POA: Diagnosis not present

## 2022-06-27 DIAGNOSIS — I639 Cerebral infarction, unspecified: Secondary | ICD-10-CM | POA: Diagnosis present

## 2022-06-27 LAB — ECHOCARDIOGRAM COMPLETE
AR max vel: 2.67 cm2
AV Area VTI: 2.91 cm2
AV Area mean vel: 2.64 cm2
AV Mean grad: 2 mmHg
AV Peak grad: 4.1 mmHg
Ao pk vel: 1.01 m/s
Area-P 1/2: 4.49 cm2
S' Lateral: 2.1 cm

## 2022-06-28 ENCOUNTER — Encounter: Payer: Self-pay | Admitting: Family

## 2022-07-03 ENCOUNTER — Ambulatory Visit (INDEPENDENT_AMBULATORY_CARE_PROVIDER_SITE_OTHER): Payer: Medicaid Other

## 2022-07-03 ENCOUNTER — Ambulatory Visit (INDEPENDENT_AMBULATORY_CARE_PROVIDER_SITE_OTHER): Payer: Medicaid Other | Admitting: Podiatry

## 2022-07-03 DIAGNOSIS — M79671 Pain in right foot: Secondary | ICD-10-CM

## 2022-07-03 DIAGNOSIS — S93499A Sprain of other ligament of unspecified ankle, initial encounter: Secondary | ICD-10-CM

## 2022-07-03 DIAGNOSIS — M25371 Other instability, right ankle: Secondary | ICD-10-CM
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
## 2022-07-03 NOTE — Patient Instructions (Signed)
For instructions on how to put on your Tri-Lock Ankle Brace, please visit [email or web address] 

## 2022-07-09 ENCOUNTER — Encounter: Payer: Self-pay | Admitting: Family

## 2022-07-09 ENCOUNTER — Ambulatory Visit (INDEPENDENT_AMBULATORY_CARE_PROVIDER_SITE_OTHER): Payer: Medicaid Other | Admitting: Family

## 2022-07-09 VITALS — BP 95/65 | HR 73 | Temp 98.2°F | Resp 18 | Ht 64.0 in | Wt 96.2 lb

## 2022-07-09 DIAGNOSIS — D649 Anemia, unspecified: Secondary | ICD-10-CM | POA: Diagnosis not present

## 2022-07-09 DIAGNOSIS — R634 Abnormal weight loss: Secondary | ICD-10-CM

## 2022-07-09 DIAGNOSIS — F32A Depression, unspecified: Secondary | ICD-10-CM | POA: Diagnosis not present

## 2022-07-09 LAB — FERRITIN: Ferritin: 15.6 ng/mL (ref 10.0–291.0)

## 2022-07-09 LAB — CBC WITH DIFFERENTIAL/PLATELET
Basophils Absolute: 0.1 10*3/uL (ref 0.0–0.1)
Basophils Relative: 1.3 % (ref 0.0–3.0)
Eosinophils Absolute: 0.3 10*3/uL (ref 0.0–0.7)
Eosinophils Relative: 5 % (ref 0.0–5.0)
HCT: 34.6 % — ABNORMAL LOW (ref 36.0–46.0)
Hemoglobin: 11.7 g/dL — ABNORMAL LOW (ref 12.0–15.0)
Lymphocytes Relative: 33.3 % (ref 12.0–46.0)
Lymphs Abs: 1.8 10*3/uL (ref 0.7–4.0)
MCHC: 33.9 g/dL (ref 30.0–36.0)
MCV: 89.4 fl (ref 78.0–100.0)
Monocytes Absolute: 0.6 10*3/uL (ref 0.1–1.0)
Monocytes Relative: 11.4 % (ref 3.0–12.0)
Neutro Abs: 2.7 10*3/uL (ref 1.4–7.7)
Neutrophils Relative %: 49 % (ref 43.0–77.0)
Platelets: 287 10*3/uL (ref 150.0–400.0)
RBC: 3.87 Mil/uL (ref 3.87–5.11)
RDW: 13.5 % (ref 11.5–15.5)
WBC: 5.4 10*3/uL (ref 4.0–10.5)

## 2022-07-09 LAB — VITAMIN B12: Vitamin B-12: 195 pg/mL — ABNORMAL LOW (ref 211–911)

## 2022-07-09 MED ORDER — DIPHENOXYLATE-ATROPINE 2.5-0.025 MG PO TABS: 1.0000 | ORAL_TABLET | Freq: Four times a day (QID) | ORAL | 0 refills | Status: DC | PRN

## 2022-07-09 NOTE — Assessment & Plan Note (Signed)
Eating 2 meals a day.  Encouraged pt to add breakfast daily. Will recheck weight in 1 month. If pt is still experiencing weight loss at that time, plan to check CT chest/abd/pelvis to rule out underlying malignancy.

## 2022-07-09 NOTE — Progress Notes (Signed)
Subjective:   By signing my name below, I, Shehryar Baig, attest that this documentation has been prepared under the direction and in the presence of Debbrah Alar, NP. 07/09/2022   Patient ID: Misty Beck, female    DOB: 1971/05/07, 51 y.o.   MRN: [id number]  Chief Complaint  Patient presents with   Follow-up    When walking around the house feels as if she is walking in puddles of cold water, also feels as If cold water runs down her legs     HPI Patient is in today for a follow up visit.   CVA: She received a CT angio since last visit and found she had no sign of damage from her previous stroke. She also received an echocardiogram and found no new issue. She had a carotid ultra sound ordered but did not go to the appointment due to a misunderstanding.   Foot and ankle: She has seen a foot and ankle specialist and reports her ligaments are damaged and she will require surgery. She was scheduled for a MRI but she was not called to schedule an appointment.   Cold sensation: She complains of cold sensations running down her feet. She reports it feels like "cold water" is running down her feet.   Weight: She continues losing weight at this time. She is eating more in her diet. Her last thyroid levels were normal. Her weight prior to noticing her weight loss she weighed just over 108 lb's. She is eating 2 meals a day. This is normal for her prior to losing weight. She does not eat breakfast. She occasionally snacks. She continues having diarrhea a this time. She also reports vomiting yesterday. She does not know the cause of her vomiting. She continues following up with her GI specialist regularly and is UTD on colonoscopy and endoscopy. She is also requesting a refill on lomotil.  Wt Readings from Last 3 Encounters:  07/09/22 96 lb 3.2 oz (43.6 kg)  06/12/22 98 lb 12.8 oz (44.8 kg)  04/16/22 100 lb (45.4 kg)   Lab Results  Component Value Date   TSH 0.88 06/12/2022   Mood: She  notes her mood is great due to the upcomming holidays and reconnecting with her sister. Outside of that her mood is down due to only completing her normal daily routine and not having as much friends to talk to. She continues taking 100 mg sertraline daily PO. She also feels overwhelmed due to taking care of her husband frequent physical injuries and raising a puppy on top of her daily routine and job.    Past Medical History:  Diagnosis Date   Asthma    Depression with anxiety 04/20/2021   Referral source Dr Doristine Section bonsu   Diarrhea    referral source   Hyperlipidemia    referral source   Insomnia 04/20/2021   referral source Dr Gayleen Orem   Received intravenous tissue plasminogen activator (tPA) in emergency department 10/24/2021   Stroke [facility])    Dec, 2021, Jan 2022    Past Surgical History:  Procedure Laterality Date   APPENDECTOMY  1992   left shoulder surgery     referral source Dr Roney Jaffe   PARTIAL HYSTERECTOMY     2009    Family History  Problem Relation Age of Onset   Stroke Mother    Cancer Mother    Colon polyps Mother    Skin cancer Mother    Breast cancer Mother 76  Kidney disease Father    Hyperlipidemia Father    COPD Father    Heart disease Father    Colon cancer Neg Hx    Rectal cancer Neg Hx     Social History   Socioeconomic History   Marital status: Divorced    Spouse name: Not on file   Number of children: 2   Years of education: Not on file   Highest education level: Not on file  Occupational History   Not on file  Tobacco Use   Smoking status: Former    Types: Cigarettes   Smokeless tobacco: Never  Substance and Sexual Activity   Alcohol use: Not Currently   Drug use: Not Currently   Sexual activity: Not on file  Other Topics Concern   Not on file  Social History Narrative   Not on file   Social Determinants of Health   Financial Resource Strain: Not on file  Food Insecurity: Not on file  Transportation Needs: Not on  file  Physical Activity: Not on file  Stress: Not on file  Social Connections: Not on file  Intimate Partner Violence: Not on file    Outpatient Medications Prior to Visit  Medication Sig Dispense Refill   aspirin EC 325 MG tablet Take 1 tablet (325 mg total) by mouth daily.     atorvastatin (LIPITOR) 10 MG tablet Take 1 tablet (10 mg total) by mouth daily. 90 tablet 1   diclofenac Sodium (VOLTAREN) 1 % GEL APPLY 2 GRAMS TOPICALLY 4 TIMES DAILY 400 g 1   dicyclomine (BENTYL) 10 MG capsule Take 1 capsule (10 mg total) by mouth every 6 (six) hours as needed for spasms. 60 capsule 3   gabapentin (NEURONTIN) 100 MG capsule Take 1 capsule (100 mg total) by mouth 3 (three) times daily. 90 capsule 1   LORazepam (ATIVAN) 0.5 MG tablet Take 0.5 mg by mouth every 8 (eight) hours.     methocarbamol (ROBAXIN) 500 MG tablet Take 1 tablet (500 mg total) by mouth every 8 (eight) hours as needed for muscle spasms. 35 tablet 0   ondansetron (ZOFRAN-ODT) 4 MG disintegrating tablet Take 1 tablet (4 mg total) by mouth every 8 (eight) hours as needed for nausea or vomiting. 30 tablet 0   sertraline (ZOLOFT) 100 MG tablet Take 1 tablet (100 mg total) by mouth daily. 90 tablet 3   traZODone (DESYREL) 100 MG tablet Take 1 tablet (100 mg total) by mouth at bedtime as needed for sleep. 90 tablet 3   diphenoxylate-atropine (LOMOTIL) 2.5-0.025 MG tablet Take 1 tablet by mouth 4 (four) times daily as needed for up to 30 doses for diarrhea or loose stools. 30 tablet 0   No facility-administered medications prior to visit.    No Known Allergies  Review of Systems  Constitutional:  Positive for weight loss.  Neurological:        (+)cold sensation on bilateral feet       Objective:    Physical Exam Constitutional:      General: She is not in acute distress.    Appearance: Normal appearance. She is not ill-appearing.  HENT:     Head: Normocephalic and atraumatic.     Right Ear: External ear normal.     Left  Ear: External ear normal.  Eyes:     Extraocular Movements: Extraocular movements intact.     Pupils: Pupils are equal, round, and reactive to light.  Cardiovascular:     Rate and Rhythm: Normal rate and  regular rhythm.     Heart sounds: Normal heart sounds. No murmur heard.    No gallop.  Pulmonary:     Effort: Pulmonary effort is normal. No respiratory distress.     Breath sounds: Normal breath sounds. No wheezing or rales.  Skin:    General: Skin is warm and dry.  Neurological:     Mental Status: She is alert and oriented to person, place, and time.  Psychiatric:        Judgment: Judgment normal.     BP 95/65 (BP Location: Right Arm, Patient Position: Sitting, Cuff Size: Normal)   Pulse 73   Temp 98.2 F (36.8 C) (Oral)   Resp 18   Ht '5\' 4"'$  (1.626 m)   Wt 96 lb 3.2 oz (43.6 kg)   SpO2 100%   BMI 16.51 kg/m  Wt Readings from Last 3 Encounters:  07/09/22 96 lb 3.2 oz (43.6 kg)  06/12/22 98 lb 12.8 oz (44.8 kg)  04/16/22 100 lb (45.4 kg)       Assessment & Plan:  Anemia, unspecified type -     Vitamin B12 -     CBC with Differential/Platelet -     Ferritin -     Iron, TIBC and Ferritin Panel  Depression, unspecified depression type Assessment & Plan: Uncontrolled.  Will increase sertraline from '100mg'$  to '150mg'$ .    Weight loss Assessment & Plan: Eating 2 meals a day.  Encouraged pt to add breakfast daily. Will recheck weight in 1 month. If pt is still experiencing weight loss at that time, plan to check CT chest/abd/pelvis to rule out underlying malignancy.   Other orders -     Diphenoxylate-Atropine; Take 1 tablet by mouth 4 (four) times daily as needed for up to 30 doses for diarrhea or loose stools.  Dispense: 30 tablet; Refill: 0    I, Nance Pear, NP, personally preformed the services described in this documentation.  All medical record entries made by the scribe were at my direction and in my presence.  I have reviewed the chart and discharge  instructions (if applicable) and agree that the record reflects my personal performance and is accurate and complete. 07/09/2022   I,Shehryar Baig,acting as a Education administrator for Nance Pear, NP.,have documented all relevant documentation on the behalf of Nance Pear, NP,as directed by  Nance Pear, NP while in the presence of Nance Pear, NP.   Nance Pear, NP

## 2022-07-09 NOTE — Patient Instructions (Signed)
Increase sertraline to 1.5 tabs once daily. Complete stool kit and return at your earliest convenience

## 2022-07-09 NOTE — Assessment & Plan Note (Signed)
Uncontrolled.  Will increase sertraline from '100mg'$  to '150mg'$ .

## 2022-07-10 ENCOUNTER — Telehealth: Payer: Self-pay | Admitting: Family

## 2022-07-10 DIAGNOSIS — E538 Deficiency of other specified B group vitamins: Secondary | ICD-10-CM

## 2022-07-10 HISTORY — DX: Deficiency of other specified B group vitamins: E53.8

## 2022-07-10 LAB — IRON,TIBC AND FERRITIN PANEL
%SAT: 22 % (calc) (ref 16–45)
Ferritin: 18 ng/mL (ref 16–232)
Iron: 74 ug/dL (ref 45–160)
TIBC: 333 mcg/dL (calc) (ref 250–450)

## 2022-07-10 NOTE — Telephone Encounter (Signed)
B12 level is low. This likely explains the foot discomfort that she has been experiencing.  I would like for her to begin b12 injections 1077mg IM weekly x 4, then monthly. This should also help her anemia.

## 2022-07-11 NOTE — Telephone Encounter (Signed)
Spoke with Pt she stated she will start B12 but wanted to know if there was a different option rather than the injection. She would like to take it in tablets if possible.

## 2022-07-12 NOTE — Telephone Encounter (Signed)
Most people who have low b12 and are not vegan do not absorb it well through their small intestine.  That is why the shots are best.

## 2022-07-13 NOTE — Telephone Encounter (Signed)
Pt aware and voices understanding.   

## 2022-07-13 NOTE — Telephone Encounter (Signed)
Left vm to return call.    

## 2022-07-13 NOTE — Telephone Encounter (Signed)
Recommend lomotil prn. If this does not help, then I will order stool studies.  If symptoms worsen over the weekend or if she begins to feel dehydrated, she should go to the ER.

## 2022-07-13 NOTE — Telephone Encounter (Signed)
Pt will come in for initial injection: she would like a teaching so that she can administer the last 3 weekly injections at home.   She also complains of uncontrolled diarrhea x 2 weeks that is like water. She declines abdominal pain. But is taking her Bentyl. She is trying to stay hydrated.   Please advise.

## 2022-07-19 ENCOUNTER — Ambulatory Visit (INDEPENDENT_AMBULATORY_CARE_PROVIDER_SITE_OTHER): Payer: Medicaid Other

## 2022-07-19 DIAGNOSIS — E538 Deficiency of other specified B group vitamins: Secondary | ICD-10-CM | POA: Diagnosis not present

## 2022-07-19 MED ORDER — CYANOCOBALAMIN 1000 MCG/ML IJ SOLN
1000.0000 ug | Freq: Once | INTRAMUSCULAR | Status: AC
  Administered 2022-07-19: 1000 ug via INTRAMUSCULAR

## 2022-07-19 MED ORDER — CYANOCOBALAMIN 1000 MCG/ML IJ SOLN: 1000.0000 ug | INTRAMUSCULAR | 0 refills | Status: DC

## 2022-07-19 NOTE — Progress Notes (Signed)
Misty Beck is a 51 y.o. female presents to the office today for 1/4 weekly B12 injection per physician's orders. Original order: 07/10/22: "B12 level is low. This likely explains the foot discomfort that she has been experiencing. I would like for her to begin b12 injections 1091mg IM weekly x 4, then monthly. This should also help her anemia. " Cyanocobalamin 1000 mg/ml administered today in L  deltoid. Patient tolerated injection.  Patient due for follow up labs/provider appt: No.  Patient next injection due: 1 week, appt made No- pt will self admin the remaining injections.    Diarrhea: Pt says her Diarrhea has not subsided since last conversation. She is taking Bentyl and 2 OTC Lomotil with no relief. Per last note- stool studies. Pt was advised to go home and hydrate with Gatorade/ Pedialite and KRoderic Ovensreinforced that she followed up in office if sxs do not improved. No appointments in the office today, and ER waits are long, plus patient is afraid of IV's.     [place], TDarlis Loan

## 2022-07-25 ENCOUNTER — Other Ambulatory Visit: Payer: Self-pay | Admitting: Podiatry

## 2022-07-25 DIAGNOSIS — M25371 Other instability, right ankle: Secondary | ICD-10-CM

## 2022-07-25 DIAGNOSIS — M79671 Pain in right foot: Secondary | ICD-10-CM

## 2022-07-25 DIAGNOSIS — S93499A Sprain of other ligament of unspecified ankle, initial encounter: Secondary | ICD-10-CM

## 2022-08-01 ENCOUNTER — Ambulatory Visit (INDEPENDENT_AMBULATORY_CARE_PROVIDER_SITE_OTHER): Payer: Medicaid Other | Admitting: Family

## 2022-08-01 ENCOUNTER — Emergency Department (HOSPITAL_BASED_OUTPATIENT_CLINIC_OR_DEPARTMENT_OTHER): Payer: Medicaid Other

## 2022-08-01 ENCOUNTER — Emergency Department (HOSPITAL_BASED_OUTPATIENT_CLINIC_OR_DEPARTMENT_OTHER)
Admission: EM | Admit: 2022-08-01 | Discharge: 2022-08-01 | Disposition: A | Discharge status: Home / Self Care | Payer: Medicaid Other | Attending: Emergency Medicine | Admitting: Emergency Medicine

## 2022-08-01 ENCOUNTER — Ambulatory Visit: Payer: Medicaid Other | Admitting: Cardiology

## 2022-08-01 VITALS — BP 102/66 | HR 84 | Temp 98.1°F | Resp 16 | Wt 94.8 lb

## 2022-08-01 DIAGNOSIS — K529 Noninfective gastroenteritis and colitis, unspecified: Secondary | ICD-10-CM | POA: Insufficient documentation

## 2022-08-01 DIAGNOSIS — Z7982 Long term (current) use of aspirin: Secondary | ICD-10-CM | POA: Insufficient documentation

## 2022-08-01 DIAGNOSIS — R197 Diarrhea, unspecified: Secondary | ICD-10-CM | POA: Diagnosis present

## 2022-08-01 DIAGNOSIS — R634 Abnormal weight loss: Secondary | ICD-10-CM | POA: Diagnosis not present

## 2022-08-01 LAB — CBC WITH DIFFERENTIAL/PLATELET
Abs Immature Granulocytes: 0.01 10*3/uL (ref 0.00–0.07)
Basophils Absolute: 0.1 10*3/uL (ref 0.0–0.1)
Basophils Relative: 1 %
Eosinophils Absolute: 0.1 10*3/uL (ref 0.0–0.5)
Eosinophils Relative: 1 %
HCT: 36.2 % (ref 36.0–46.0)
Hemoglobin: 11.7 g/dL — ABNORMAL LOW (ref 12.0–15.0)
Immature Granulocytes: 0 %
Lymphocytes Relative: 27 %
Lymphs Abs: 1.9 10*3/uL (ref 0.7–4.0)
MCH: 29.7 pg (ref 26.0–34.0)
MCHC: 32.3 g/dL (ref 30.0–36.0)
MCV: 91.9 fL (ref 80.0–100.0)
Monocytes Absolute: 0.6 10*3/uL (ref 0.1–1.0)
Monocytes Relative: 8 %
Neutro Abs: 4.5 10*3/uL (ref 1.7–7.7)
Neutrophils Relative %: 63 %
Platelets: 321 10*3/uL (ref 150–400)
RBC: 3.94 MIL/uL (ref 3.87–5.11)
RDW: 12.6 % (ref 11.5–15.5)
WBC: 7.1 10*3/uL (ref 4.0–10.5)
nRBC: 0 % (ref 0.0–0.2)

## 2022-08-01 LAB — COMPREHENSIVE METABOLIC PANEL
ALT: 23 U/L (ref 0–44)
AST: 27 U/L (ref 15–41)
Albumin: 4 g/dL (ref 3.5–5.0)
Alkaline Phosphatase: 55 U/L (ref 38–126)
Anion gap: 9 (ref 5–15)
BUN: 11 mg/dL (ref 6–20)
CO2: 26 mmol/L (ref 22–32)
Calcium: 9.1 mg/dL (ref 8.9–10.3)
Chloride: 102 mmol/L (ref 98–111)
Creatinine, Ser: 1.04 mg/dL — ABNORMAL HIGH (ref 0.44–1.00)
GFR, Estimated: >60 mL/min (ref >60)
Glucose, Bld: 91 mg/dL (ref 70–99)
Potassium: 3.7 mmol/L (ref 3.5–5.1)
Sodium: 137 mmol/L (ref 135–145)
Total Bilirubin: 0.6 mg/dL (ref 0.3–1.2)
Total Protein: 7.1 g/dL (ref 6.5–8.1)

## 2022-08-01 LAB — MAGNESIUM: Magnesium: 1.7 mg/dL (ref 1.7–2.4)

## 2022-08-01 MED ORDER — SODIUM CHLORIDE 0.9 % IV BOLUS
1000.0000 mL | Freq: Once | INTRAVENOUS | Status: AC
  Administered 2022-08-01: 1000 mL via INTRAVENOUS

## 2022-08-01 MED ORDER — IOHEXOL 300 MG/ML  SOLN
100.0000 mL | Freq: Once | INTRAMUSCULAR | Status: AC | PRN
  Administered 2022-08-01: 100 mL via INTRAVENOUS

## 2022-08-01 NOTE — Assessment & Plan Note (Signed)
Ongoing. TSH WNL, cbc, cmet unremarkable. Advised pt that I would recommend CT chest/abd/pelvis to rule out underling malignancy.  She states she cannot wait and wants to be admitted to the hospital. I advised her that I cannot admit her directly to the hospital but she could go to the ER for further evaluation and if they find she is eligible for admission she could be admitted through the ED.  I gave report to PA-C "Bree" at the [place] ED. Pt walked herself downstairs to the ED.  Lab Results  Component Value Date   TSH 0.88 06/12/2022

## 2022-08-01 NOTE — ED Notes (Signed)
Pt ambulatory from triage 

## 2022-08-01 NOTE — Assessment & Plan Note (Addendum)
Worsening overall. Recommended stool study repeat.  She declines- prefers to go to the ED for further evaluation. See discussion above.

## 2022-08-01 NOTE — Discharge Instructions (Signed)
Please call your gastroenterologist in the morning and let them know about your ongoing issues and see when they can see you in the office.  Please let your family doctor know about your results in the ED here.  They may want to perform further imaging of the lung nodule as well as the nodule in your liver and on your left adrenal gland.

## 2022-08-01 NOTE — ED Notes (Signed)
Lab notified to add-on magnesium to previously collected blood  sample.

## 2022-08-01 NOTE — Progress Notes (Signed)
Subjective:   By signing my name below, I, Shehryar Baig, attest that this documentation has been prepared under the direction and in the presence of Debbrah Alar, NP. 08/01/2022   Patient ID: Misty Beck, female    DOB: Jan 29, 1971, 52 y.o.   MRN: [id number]  Chief Complaint  Patient presents with   Diarrhea    Complains of diarrhea during the day and night "while sleep"   Weight Loss    "Have continued to loose weight"    Diarrhea  Associated symptoms include abdominal pain (left side) and weight loss.   Patient is in today for a office visit. She is frustrated by her ongoing diarrhea despite use of both imodium and lomotil. She reports some fecal incontinence- especially at night.  She also complains of left sided flank pain since yesterday. Her pain wraps around to her left back. She has also had additional weight loss.  She has lost 2 more lbs since her last visit. Her baseline weight prior to start of weight loss is reported to be 108 pounds. Her current weight is 94 pounds.   She has had evaluation by GI in the past for diarrhea. This included a negative GI profile on 06/01/21 and Endo/colo in 2022.  Biopsies from endo were normal ( no H Pylori or celiac disease).  Colo biopsy noted tubular adenoma, otherwise colonoscopy was unremarkable.   Past Medical History:  Diagnosis Date   Asthma    Depression with anxiety 04/20/2021   Referral source Dr Doristine Section bonsu   Diarrhea    referral source   Hyperlipidemia    referral source   Insomnia 04/20/2021   referral source Dr Gayleen Orem   Received intravenous tissue plasminogen activator (tPA) in emergency department 10/24/2021   Stroke [facility])    Dec, 2021, Jan 2022    Past Surgical History:  Procedure Laterality Date   APPENDECTOMY  1992   left shoulder surgery     referral source Dr Roney Jaffe   PARTIAL HYSTERECTOMY     2009    Family History  Problem Relation Age of Onset   Stroke Mother    Cancer Mother     Colon polyps Mother    Skin cancer Mother    Breast cancer Mother 1   Kidney disease Father    Hyperlipidemia Father    COPD Father    Heart disease Father    Colon cancer Neg Hx    Rectal cancer Neg Hx     Social History   Socioeconomic History   Marital status: Divorced    Spouse name: Not on file   Number of children: 2   Years of education: Not on file   Highest education level: Not on file  Occupational History   Not on file  Tobacco Use   Smoking status: Former    Types: Cigarettes   Smokeless tobacco: Never  Substance and Sexual Activity   Alcohol use: Not Currently   Drug use: Not Currently   Sexual activity: Not on file  Other Topics Concern   Not on file  Social History Narrative   Not on file   Social Determinants of Health   Financial Resource Strain: Not on file  Food Insecurity: Not on file  Transportation Needs: Not on file  Physical Activity: Not on file  Stress: Not on file  Social Connections: Not on file  Intimate Partner Violence: Not on file    Outpatient Medications Prior to Visit  Medication  Sig Dispense Refill   aspirin EC 325 MG tablet Take 1 tablet (325 mg total) by mouth daily.     atorvastatin (LIPITOR) 10 MG tablet Take 1 tablet (10 mg total) by mouth daily. 90 tablet 1   cyanocobalamin (VITAMIN B12) 1000 MCG/ML injection Inject 1 mL (1,000 mcg total) into the muscle once a week. 10 mL 0   diclofenac Sodium (VOLTAREN) 1 % GEL APPLY 2 GRAMS TOPICALLY 4 TIMES DAILY 400 g 1   dicyclomine (BENTYL) 10 MG capsule Take 1 capsule (10 mg total) by mouth every 6 (six) hours as needed for spasms. 60 capsule 3   diphenoxylate-atropine (LOMOTIL) 2.5-0.025 MG tablet Take 1 tablet by mouth 4 (four) times daily as needed for up to 30 doses for diarrhea or loose stools. 30 tablet 0   gabapentin (NEURONTIN) 100 MG capsule Take 1 capsule (100 mg total) by mouth 3 (three) times daily. 90 capsule 1   LORazepam (ATIVAN) 0.5 MG tablet Take 0.5 mg by mouth  every 8 (eight) hours.     methocarbamol (ROBAXIN) 500 MG tablet Take 1 tablet (500 mg total) by mouth every 8 (eight) hours as needed for muscle spasms. 35 tablet 0   ondansetron (ZOFRAN-ODT) 4 MG disintegrating tablet Take 1 tablet (4 mg total) by mouth every 8 (eight) hours as needed for nausea or vomiting. 30 tablet 0   sertraline (ZOLOFT) 100 MG tablet Take 1 tablet (100 mg total) by mouth daily. 90 tablet 3   traZODone (DESYREL) 100 MG tablet Take 1 tablet (100 mg total) by mouth at bedtime as needed for sleep. 90 tablet 3   No facility-administered medications prior to visit.    No Known Allergies  Review of Systems  Constitutional:  Positive for weight loss.  Gastrointestinal:  Positive for abdominal pain (left side) and diarrhea.       Objective:    Physical Exam Constitutional:      General: She is not in acute distress.    Appearance: She is underweight. She is not ill-appearing.  HENT:     Head: Normocephalic and atraumatic.     Right Ear: External ear normal.     Left Ear: External ear normal.  Eyes:     Extraocular Movements: Extraocular movements intact.     Pupils: Pupils are equal, round, and reactive to light.  Cardiovascular:     Rate and Rhythm: Normal rate and regular rhythm.     Heart sounds: Normal heart sounds. No murmur heard.    No gallop.  Pulmonary:     Effort: Pulmonary effort is normal. No respiratory distress.     Breath sounds: Normal breath sounds. No wheezing or rales.  Abdominal:     General: Bowel sounds are normal.     Palpations: Abdomen is soft.     Tenderness: There is no abdominal tenderness. There is no guarding or rebound.  Skin:    General: Skin is warm and dry.  Neurological:     Mental Status: She is alert and oriented to person, place, and time.  Psychiatric:        Mood and Affect: Affect is tearful.        Speech: Speech normal.        Judgment: Judgment normal.     BP 102/66 (BP Location: Right Arm, Patient  Position: Sitting, Cuff Size: Small)   Pulse 84   Temp 98.1 F (36.7 C) (Oral)   Resp 16   Wt 94 lb 12.8 oz (43  kg)   SpO2 100%   BMI 16.27 kg/m  Wt Readings from Last 3 Encounters:  08/01/22 94 lb (42.6 kg)  08/01/22 94 lb 12.8 oz (43 kg)  07/09/22 96 lb 3.2 oz (43.6 kg)       Assessment & Plan:  Weight loss Assessment & Plan: Ongoing. TSH WNL, cbc, cmet unremarkable. Advised pt that I would recommend CT chest/abd/pelvis to rule out underling malignancy.  She states she cannot wait and wants to be admitted to the hospital. I advised her that I cannot admit her directly to the hospital but she could go to the ER for further evaluation and if they find she is eligible for admission she could be admitted through the ED.  I gave report to PA-C "Bree" at the [place] ED. Pt walked herself downstairs to the ED.  Lab Results  Component Value Date   TSH 0.88 06/12/2022      Chronic diarrhea Assessment & Plan: Worsening overall. Recommended stool study repeat.  She declines- prefers to go to the ED for further evaluation. See discussion above.    30 minutes spent on today's visit. Time was spent reviewing medical record, calling report to ED and counseling patient on her care and work up.   I, Nance Pear, NP, personally preformed the services described in this documentation.  All medical record entries made by the scribe were at my direction and in my presence.  I have reviewed the chart and discharge instructions (if applicable) and agree that the record reflects my personal performance and is accurate and complete. 08/01/2022   I,Shehryar Baig,acting as a Education administrator for Nance Pear, NP.,have documented all relevant documentation on the behalf of Nance Pear, NP,as directed by  Nance Pear, NP while in the presence of Nance Pear, NP.   Nance Pear, NP

## 2022-08-01 NOTE — ED Triage Notes (Signed)
Pt reports diarrhea x months; reports 18 lb weight loss since the fall; sts she is incontinent at night; she cannot leave the house d/t diarrhea

## 2022-08-01 NOTE — ED Notes (Signed)
D/c paperwork reviewed with pt, including follow up care.  No questions or concerns voiced at time of d/c. . Pt verbalized understanding, Ambulatory without assistance to ED exit, NAD.   

## 2022-08-01 NOTE — ED Provider Notes (Signed)
[facility] EMERGENCY DEPARTMENT Provider Note   CSN: [id number] Arrival date & time: 08/01/22  1306     History  Chief Complaint  Patient presents with   Diarrhea    Misty Beck is a 52 y.o. female.  52 yo F with a chief complaint of diarrhea.  This been going on for quite some time.  She tells me it is getting progressively worse since is even occurring when she sleeps.  She has seen GI for this and her family doctor.  Got to the point where she feels like she is losing continuous weight and she feels like she needs to come to the hospital and be evaluated until figured out what is wrong with her.  She saw her family doctor today and they had encouraged her to do further outpatient studies and after some discussion elected for her to come down to the emergency department for emergent imaging of the chest abdomen and pelvis.  She has diffuse abdominal cramping with this.  Is taking what sounds like 6 Imodium's a day.  She denies any specific diet change.  She denies suggestion that she is taking a radical diet.   Diarrhea      Home Medications Prior to Admission medications   Medication Sig Start Date End Date Taking? Authorizing Provider  aspirin EC 325 MG tablet Take 1 tablet (325 mg total) by mouth daily. 06/12/22   Debbrah Alar, NP  atorvastatin (LIPITOR) 10 MG tablet Take 1 tablet (10 mg total) by mouth daily. 04/18/22   Debbrah Alar, NP  cyanocobalamin (VITAMIN B12) 1000 MCG/ML injection Inject 1 mL (1,000 mcg total) into the muscle once a week. 07/19/22   Debbrah Alar, NP  diclofenac Sodium (VOLTAREN) 1 % GEL APPLY 2 GRAMS TOPICALLY 4 TIMES DAILY 04/16/22   Debbrah Alar, NP  dicyclomine (BENTYL) 10 MG capsule Take 1 capsule (10 mg total) by mouth every 6 (six) hours as needed for spasms. 05/23/21   Cirigliano, Vito V, DO  diphenoxylate-atropine (LOMOTIL) 2.5-0.025 MG tablet Take 1 tablet by mouth 4 (four) times daily as needed for up to  30 doses for diarrhea or loose stools. 07/09/22   Debbrah Alar, NP  gabapentin (NEURONTIN) 100 MG capsule Take 1 capsule (100 mg total) by mouth 3 (three) times daily. 04/16/22   Debbrah Alar, NP  LORazepam (ATIVAN) 0.5 MG tablet Take 0.5 mg by mouth every 8 (eight) hours.    [provider]  methocarbamol (ROBAXIN) 500 MG tablet Take 1 tablet (500 mg total) by mouth every 8 (eight) hours as needed for muscle spasms. 12/06/21   Meredith Pel, MD  ondansetron (ZOFRAN-ODT) 4 MG disintegrating tablet Take 1 tablet (4 mg total) by mouth every 8 (eight) hours as needed for nausea or vomiting. 04/16/22   Debbrah Alar, NP  sertraline (ZOLOFT) 100 MG tablet Take 1 tablet (100 mg total) by mouth daily. 10/24/21   Marrian Salvage, FNP  traZODone (DESYREL) 100 MG tablet Take 1 tablet (100 mg total) by mouth at bedtime as needed for sleep. 10/24/21   Marrian Salvage, [place]      Allergies    Patient has no known allergies.    Review of Systems   Review of Systems  Gastrointestinal:  Positive for diarrhea.    Physical Exam Updated Vital Signs BP 113/75   Pulse 79   Temp 97.7 F (36.5 C)   Resp 18   Ht '5\' 4"'$  (1.626 m)   Wt 42.6  kg   SpO2 98%   BMI 16.14 kg/m  Physical Exam Vitals and nursing note reviewed.  Constitutional:      General: She is not in acute distress.    Appearance: She is well-developed. She is not diaphoretic.  HENT:     Head: Normocephalic and atraumatic.  Eyes:     Pupils: Pupils are equal, round, and reactive to light.  Cardiovascular:     Rate and Rhythm: Normal rate and regular rhythm.     Heart sounds: No murmur heard.    No friction rub. No gallop.  Pulmonary:     Effort: Pulmonary effort is normal.     Breath sounds: No wheezing or rales.  Abdominal:     General: There is no distension.     Palpations: Abdomen is soft.     Tenderness: There is abdominal tenderness.     Comments: Mild diffuse abdominal discomfort   Musculoskeletal:        General: No tenderness.     Cervical back: Normal range of motion and neck supple.  Skin:    General: Skin is warm and dry.  Neurological:     Mental Status: She is alert and oriented to person, place, and time.  Psychiatric:        Behavior: Behavior normal.     ED Results / Procedures / Treatments   Labs (all labs ordered are listed, but only abnormal results are displayed) Labs Reviewed  CBC WITH DIFFERENTIAL/PLATELET - Abnormal; Notable for the following components:      Result Value   Hemoglobin 11.7 (*)    All other components within normal limits  COMPREHENSIVE METABOLIC PANEL - Abnormal; Notable for the following components:   Creatinine, Ser 1.04 (*)    All other components within normal limits  MAGNESIUM  URINALYSIS, ROUTINE W REFLEX MICROSCOPIC    EKG None  Radiology CT CHEST ABDOMEN PELVIS W CONTRAST  Result Date: 08/01/2022 CLINICAL DATA:  Metastatic disease evaluation. 18 pound weight loss. Diarrhea for several months. EXAM: CT CHEST, ABDOMEN, AND PELVIS WITH CONTRAST TECHNIQUE: Multidetector CT imaging of the chest, abdomen and pelvis was performed following the standard protocol during bolus administration of intravenous contrast. RADIATION DOSE REDUCTION: This exam was performed according to the departmental dose-optimization program which includes automated exposure control, adjustment of the mA and/or kV according to patient size and/or use of iterative reconstruction technique. CONTRAST:  183m OMNIPAQUE IOHEXOL 300 MG/ML  SOLN COMPARISON:  None Available. FINDINGS: CT CHEST FINDINGS Cardiovascular: The heart size is normal.  No pericardial effusion. Mediastinum/Nodes: No enlarged mediastinal, hilar, or axillary lymph nodes. Thyroid gland, trachea, and esophagus demonstrate no significant findings. Lungs/Pleura: No pleural effusion. No airspace disease. Perifissural nodule along the minor fissure measures 3 mm and is compatible with a  benign intrapulmonary lymph node. Tiny subpleural nodule in the right middle lobe measures 2 mm, image 102/3. Nodule within the lingula measures 4 mm, image 85/3. Subpleural nodule in the periphery of the left lower lobe measures 3 mm, image 95/3. Musculoskeletal: Mild thoracolumbar scoliosis. No chest wall mass. No acute or suspicious osseous lesions. CT ABDOMEN PELVIS FINDINGS Hepatobiliary: Focal low-density structure adjacent to falciform ligament within the medial segment of left lobe of liver likely represents focal fatty deposition, within the posterior right hepatic lobe there is an indeterminate low-density structure measuring 1.2 x 1.0 cm, image 49/2. Gallbladder appears normal. Pancreas: Unremarkable. No pancreatic ductal dilatation or surrounding inflammatory changes. Spleen: Normal in size without focal abnormality. Adrenals/Urinary Tract:  Hyperdense nodule in the left adrenal gland measures 1 cm, image 55/2. No nephrolithiasis, hydronephrosis or suspicious kidney mass. Urinary bladder is unremarkable. Stomach/Bowel: Stomach appears within normal limits. The patient is status post appendectomy. No pathologic dilatation of the large or small bowel loops. No bowel wall thickening or inflammation identified. Vascular/Lymphatic: Normal appearance of the abdominal aorta. No abdominopelvic adenopathy identified. Reproductive: Status post partial hysterectomy. No adnexal mass identified. Other: Trace fluid noted within the left posterior pelvis. No focal fluid collections identified. No peritoneal nodularity. Musculoskeletal: No acute or suspicious osseous findings. IMPRESSION: 1. No acute findings within the chest, abdomen or pelvis. 2. No specific findings identified to suggest primary malignancy or metastatic disease. 3. Small nonspecific pulmonary nodules are identified measuring up to 4 mm. No follow-up needed if patient is low-risk (and has no known or suspected primary neoplasm). Non-contrast chest CT  can be considered in 12 months if patient is high-risk. This recommendation follows the consensus statement: Guidelines for Management of Incidental Pulmonary Nodules Detected on CT Images: From the Fleischner Society 2017; Radiology 2017; 284:228-243. 4. Indeterminate low-density structure within the posterior right hepatic lobe measures 1.2 x 1.0 cm. When the patient is clinically stable and able to follow directions and hold their breath (preferably as an outpatient) further evaluation with dedicated abdominal MRI should be considered. 5. Hyperdense nodule in the left adrenal gland measures 1 cm. This is indeterminate. In the absence of a known malignancy this likely represents a benign adenoma. According to consensus criteria in the absence of a known malignancy consider follow-up adrenal protocol CT in 12 months. This could also be further evaluated with abdominal MRI. 6. Trace fluid noted within the left posterior pelvis, nonspecific. Electronically Signed   By: Kerby Moors M.D.   On: 08/01/2022 17:06    Procedures Procedures    Medications Ordered in ED Medications  sodium chloride 0.9 % bolus 1,000 mL (1,000 mLs Intravenous New Bag/Given 08/01/22 1601)  iohexol (OMNIPAQUE) 300 MG/ML solution 100 mL (100 mLs Intravenous Contrast Given 08/01/22 1632)    ED Course/ Medical Decision Making/ A&P                           Medical Decision Making Amount and/or Complexity of Data Reviewed Labs: ordered. Radiology: ordered.  Risk Prescription drug management.   52 yo F with a chief complaints of diarrhea.  She tells me that this has been an ongoing issue for her.  Nothing seems to be working.  She has seen her family doctor for this multiple times and has done a workup through the [facility] gastroenterology office.  She continued to have ongoing symptoms and saw her family doctor again today who encouraged her to continue her outpatient workup and she elected to come here for emergent  imaging.  She had labs obtained here without obvious concerning finding, normal potassium.  Interestingly has a normal bicarb.   CT scan without obvious acute finding.  There is no obvious cancer finding either.  She has a lung nodule as well as a low-density hepatic structure as well as a hyperdense nodule in the left adrenal gland.  Will have her follow-up with her family doctor.  With ongoing diarrhea encouraged her to follow-up with her gastroenterologist.  5:21 PM:  I have discussed the diagnosis/risks/treatment options with the patient.  Evaluation and diagnostic testing in the emergency department does not suggest an emergent condition requiring admission or immediate intervention beyond what has  been performed at this time.  They will follow up with PCP. We also discussed returning to the ED immediately if new or worsening sx occur. We discussed the sx which are most concerning (e.g., sudden worsening pain, fever, inability to tolerate by mouth) that necessitate immediate return. Medications administered to the patient during their visit and any new prescriptions provided to the patient are listed below.  Medications given during this visit Medications  sodium chloride 0.9 % bolus 1,000 mL (1,000 mLs Intravenous New Bag/Given 08/01/22 1601)  iohexol (OMNIPAQUE) 300 MG/ML solution 100 mL (100 mLs Intravenous Contrast Given 08/01/22 1632)     The patient appears reasonably screen and/or stabilized for discharge and I doubt any other medical condition or other [facility] requiring further screening, evaluation, or treatment in the ED at this time prior to discharge.          Final Clinical Impression(s) / ED Diagnoses Final diagnoses:  Chronic diarrhea    Rx / DC Orders ED Discharge Orders     None         Deno Etienne, DO 08/01/22 1721

## 2022-08-02 ENCOUNTER — Telehealth: Payer: Self-pay | Admitting: Gastroenterology

## 2022-08-02 NOTE — Telephone Encounter (Signed)
Attempted to contact patient but was unable to leave voicemail. Pt scheduled for appointment with Tye Savoy NP on 08/09/22.

## 2022-08-02 NOTE — Telephone Encounter (Signed)
Spoke with pt and let her know about appointment on 08/09/22 with Tye Savoy NP. Pt verbalized understanding.

## 2022-08-02 NOTE — Telephone Encounter (Signed)
Patient called.  She went to the ER last night and was instructed to make an appt with Dr. Bryan Lemma as soon as possible.  The first I have is 1/30.  She needs to be seen sooner.  CT scan done last night revealed nodules in the lungs and liver and she has had uncontrollable diarrhea and a 20-pound weight loss.  Can you find her an appointment to come in sooner?  Please call patient and advise.

## 2022-08-03 NOTE — Telephone Encounter (Signed)
Patient called states she is in excruciating pain today and is seeking further advise.

## 2022-08-03 NOTE — Telephone Encounter (Signed)
Spoke with pt and gave pt recommendations. Pt verbalized understanding and stated that she would go to ER.

## 2022-08-03 NOTE — Telephone Encounter (Signed)
Pt states that she started having severe sharp constant left lower quadrant abdominal pain today and doesn't think she can make it until follow up appointment on 08/09/22. Pt stated she can't even stand up straight pain is so severe. Pt is also still having liquid diarrhea. Pt hasn't taken temperature. Pt also states she has occasional nausea without vomiting. Pain is also in lower back as well. Let pt know that she sounds like she is in severe pain in which case she would need to go to the ER or urgent care. Pt did not want to go to ER or urgent care because pt states she just went to the ER on 1/3 and they told her to follow up with Dr. Bryan Lemma. Asked pt if she had tried dicyclomine and pt doesn't think she has any at home.

## 2022-08-03 NOTE — Telephone Encounter (Signed)
CT scan from the ER was reviewed.  Nonspecific pulmonary nodules with consideration for chest CT in 12 months.  1.2 x 1.0 cm indeterminate low-density structure in the posterior right hepatic lobe.  Will require MRI three-phase liver as an outpatient.  Labs from the ER were otherwise largely unremarkable to include CBC, CMP.  Based on her description of excruciating pain, I agree that ER is the best place for expedited evaluation, particular heading into the weekend.  For workup of her diarrhea and pain, other studies I would add include ESR, CRP, fecal calprotectin, GI PCR panel, pancreatic elastase.  While she can trial the dicyclomine that she has (or if she needs an new prescription we can send that in), again if excruciating pain or other concerning features such as inability to tolerate p.o., fevers, etc., best place would be ER for repeat evaluation.

## 2022-08-09 ENCOUNTER — Other Ambulatory Visit: Payer: Medicaid Other

## 2022-08-09 ENCOUNTER — Encounter: Payer: Self-pay | Admitting: Nurse Practitioner

## 2022-08-09 ENCOUNTER — Ambulatory Visit (INDEPENDENT_AMBULATORY_CARE_PROVIDER_SITE_OTHER): Payer: Medicaid Other | Admitting: Nurse Practitioner

## 2022-08-09 VITALS — BP 100/68 | HR 75 | Ht 64.0 in | Wt 94.5 lb

## 2022-08-09 DIAGNOSIS — R1030 Lower abdominal pain, unspecified: Secondary | ICD-10-CM

## 2022-08-09 DIAGNOSIS — K529 Noninfective gastroenteritis and colitis, unspecified: Secondary | ICD-10-CM

## 2022-08-09 DIAGNOSIS — R634 Abnormal weight loss: Secondary | ICD-10-CM

## 2022-08-09 DIAGNOSIS — K769 Liver disease, unspecified: Secondary | ICD-10-CM

## 2022-08-09 MED ORDER — VIBERZI 100 MG PO TABS: 1.0000 | ORAL_TABLET | Freq: Two times a day (BID) | ORAL | 0 refills | Status: DC

## 2022-08-09 MED ORDER — TRAMADOL HCL 50 MG PO TABS: 50.0000 mg | ORAL_TABLET | Freq: Two times a day (BID) | ORAL | 0 refills | Status: DC | PRN

## 2022-08-09 NOTE — Progress Notes (Signed)
Assessment    Patient profile:  Misty Beck is a 52 y.o. female known to Dr. Bryan Lemma with a past medical history of colon polyps ( small adenoma Dec 2022), chronic diarrhea CVA, depression, anxiety. See PMH /PSH for additional history  # Chronic diarrhea, severe lower abdominal pain. Negative evaluation of diarrhea one year ago. Now with weight loss of 12 pounds ( per Epic) since March 2023. Also new anemia and B12 deficiency. Recent ED visit for symptoms. Electrolytes, bicarb normal. WBC normal. CT scan unrevealing.  DDX. Severe IBS vr EPI vr small bowel pathology No evidence for IBD or microscopic colitis on colonoscopy done for diarrhea a year ago. Examined portion of terminal ileum appeared normal.  No celiac on duodenal biopsies.  Normal thyroid studies  # [place] anemia, B12 deficiency.  Anemia may be combination of Vit B12 deficiency and ? iron deficiency with low normal ferritin of 18.Hgb stable at 11.7.   No overt GI Bleeding No recent procedures No menstrual cycles in 14 years.  B12 deficiency being treated.  EGD / colonoscopy a year ago ( for diarrhea) negative except for a small colon adenoma  # Vitamin B 12 deficiency. B12 195 in December 2023.  No H.pylori or atropic gastritis on EGD last year.  Normal visualized portion of terminal ileum on colonoscopy. No H.pylori or atrophic gastritis on gastric biopsies . No ileal abnormalities on recent CT scan  Started injections 3 weeks ago. Has had 3 injections and now starting Q month dosing.   # Abnormal CT scan 08/01/22 - Within the posterior right hepatic lobe there is an indeterminate low-density structure measuring 1.2 x 1.0 cm. MRI recommended. There is a hyperdense nodule in left adrenal gland measuring 1 cm and small non-specific pulmonary nodule measuring up to 4 mm.   # History of colon polyps. Small adenoma in Dec 2022. A 7 year colonoscopy is recommended.   # History of CVA. On chronic ASA. Takes 325 mg daily.    Plan   Pancreatic elastase  Fecal calprotectin  MRI to evaluate liver lesion. Can also hopefully further evaluate adrenal nodule.  Trial of Virberzi 100 mg twice daily with food. Once starting Virberzi, hold imodium and lomotil.  Hard for her to leave home without fecal incontinence so will as her to contact us in a couple of weeks after starting Virberzi. If not better then consider CT enterography to assess small bowel ( diarrhea, b12 def, weight loss) Requesting something for pain. Will give a limited supply of ultram while undergoing workup.    HPI    Chief complaint:  diarrhea, lower abdominal pain, weight loss   Misty Beck was seen here October 2022 for evaluation of relatively new onset diarrhea.  Labs and stool studies were negative.  She subsequently underwent an EGD and colonoscopy.  No celiac on duodenal biopsies. There were some prep related mucosal changes. No evidence for microscopic colitis.   Interval History:  Misty Beck was seen in the ED on 324 with complaints of diarrhea and abdominal cramping .  Her white count was normal.  Hemoglobin stable at 7.7. CMET unremarkable. B12 low at 195. CT scan without obvious acute finding. There is no obvious cancer finding either. She has a lung nodule as well as a low-density hepatic structure as well as a hyperdense nodule in the left adrenal gland. Will have her follow-up with her family doctor.   Misty Beck has no life due to the diarrhea and abdominal pain. The  pain is in her LLQ and radiates around to her back. She has at least 5 diarrheal BMs / day (brown water). Stools are not oily. She has associated incontinence. She hasn't had a solid stool in a long time, cannot even remember. No blood in the stool Dicyclomine doesn't help. She is taking 4 imodium ad daily two lomotil which doesn't help at all. Diarrhea wasn't this bad when she saw [place] last year. Symptoms have gotten worse in the last few months. Worsening diarrhea doesn't coincide with any  medication changes. No recent antibiotics. No OTC meds.    Previous GI Evaluation   Dec 2022 colonoscopy for diarrhea -A small polyp was removed.  Normal colonic mucosa throughout colon.   Dec 2022 EGD -normal.   Diagnosis 1. Surgical [P], duodenal FRAGMENTS OF NORMAL DUODENAL MUCOSA. THERE ARE NO DIAGNOSTIC FEATURES OF CELIAC DISEASE. 2. Surgical [P], gastric FRAGMENTS OF GASTRIC MUCOSA WITH NO SIGNIFICANT MICROSCOPIC ABNORMALITIES. H. PYLORI AND INTESTINAL METAPLASIA ARE NOT IDENTIFIED. 3. Surgical [P], right colon MILD NONSPECIFIC INFLAMMATION CONSISTENT WITH PREP RELATED CHANGES. THERE ARE NO DIAGNOSTIC FEATURES OF INFLAMMATORY BOWEL DISEASE, MICROSCOPIC COLITIS AND COLLAGENOUS COLITIS. 4. Surgical [P], colon, ascending, polyp (1) TUBULAR ADENOMA. NEGATIVE FOR HIGH-GRADE DYSPLASIA. 5. Surgical [P], left colon MILD NONSPECIFIC INFLAMMATION CONSISTENT WITH PREP RELATED CHANGES. THERE ARE NO DIAGNOSTIC FEATURES OF INFLAMMATORY BOWEL DISEASE, MICROSCOPIC COLITIS AND COLLAGENOUS COLITIS.   Labs:     Latest Ref Rng & Units 08/01/2022    1:25 PM 07/09/2022   12:27 PM 06/12/2022   12:22 PM  CBC  WBC 4.0 - 10.5 K/uL 7.1  5.4  7.5   Hemoglobin 12.0 - 15.0 g/dL 11.7  11.7  11.6   Hematocrit 36.0 - 46.0 % 36.2  34.6  34.8   Platelets 150 - 400 K/uL 321  287.0  270.0        Latest Ref Rng & Units 08/01/2022    1:25 PM 06/12/2022   12:22 PM 09/22/2021   11:17 AM  Hepatic Function  Total Protein 6.5 - 8.1 g/dL 7.1  6.5  7.5   Albumin 3.5 - 5.0 g/dL 4.0  4.2  4.6   AST 15 - 41 U/L '27  15  20   '$ ALT 0 - 44 U/L '23  20  20   '$ Alk Phosphatase 38 - 126 U/L 55  50  47   Total Bilirubin 0.3 - 1.2 mg/dL 0.6  0.5  1.0      Past Medical History:  Diagnosis Date   Asthma    Depression with anxiety 04/20/2021   Referral source Dr Doristine Section bonsu   Diarrhea    referral source   Hyperlipidemia    referral source   Insomnia 04/20/2021   referral source Dr Gayleen Orem   Received  intravenous tissue plasminogen activator (tPA) in emergency department 10/24/2021   Stroke [facility])    Dec, 2021, Jan 2022    Past Surgical History:  Procedure Laterality Date   APPENDECTOMY  1992   left shoulder surgery     referral source Dr Roney Jaffe   PARTIAL HYSTERECTOMY     2009    Current Medications, Allergies, Family History and Social History were reviewed in [facility] record.     Current Outpatient Medications  Medication Sig Dispense Refill   aspirin EC 325 MG tablet Take 1 tablet (325 mg total) by mouth daily.     atorvastatin (LIPITOR) 10 MG tablet Take 1 tablet (10 mg total) by mouth  daily. 90 tablet 1   cyanocobalamin (VITAMIN B12) 1000 MCG/ML injection Inject 1 mL (1,000 mcg total) into the muscle once a week. 10 mL 0   diclofenac Sodium (VOLTAREN) 1 % GEL APPLY 2 GRAMS TOPICALLY 4 TIMES DAILY 400 g 1   dicyclomine (BENTYL) 10 MG capsule Take 1 capsule (10 mg total) by mouth every 6 (six) hours as needed for spasms. 60 capsule 3   diphenoxylate-atropine (LOMOTIL) 2.5-0.025 MG tablet Take 1 tablet by mouth 4 (four) times daily as needed for up to 30 doses for diarrhea or loose stools. 30 tablet 0   gabapentin (NEURONTIN) 100 MG capsule Take 1 capsule (100 mg total) by mouth 3 (three) times daily. 90 capsule 1   LORazepam (ATIVAN) 0.5 MG tablet Take 0.5 mg by mouth every 8 (eight) hours.     methocarbamol (ROBAXIN) 500 MG tablet Take 1 tablet (500 mg total) by mouth every 8 (eight) hours as needed for muscle spasms. 35 tablet 0   ondansetron (ZOFRAN-ODT) 4 MG disintegrating tablet Take 1 tablet (4 mg total) by mouth every 8 (eight) hours as needed for nausea or vomiting. 30 tablet 0   sertraline (ZOLOFT) 100 MG tablet Take 1 tablet (100 mg total) by mouth daily. 90 tablet 3   traZODone (DESYREL) 100 MG tablet Take 1 tablet (100 mg total) by mouth at bedtime as needed for sleep. 90 tablet 3   No current facility-administered medications for this  visit.    Review of Systems: No chest pain. No shortness of breath. No urinary complaints.    Physical Exam  Wt Readings from Last 3 Encounters:  08/01/22 94 lb (42.6 kg)  08/01/22 94 lb 12.8 oz (43 kg)  07/09/22 96 lb 3.2 oz (43.6 kg)    BP 100/68   Pulse 75   Ht '5\' 4"'$  (1.626 m)   Wt 94 lb 8 oz (42.9 kg)   SpO2 98%   BMI 16.22 kg/m  Constitutional:  Generally well appearing but thin female in no acute distress. Psychiatric: Pleasant. Normal mood and affect. Behavior is normal. EENT: Pupils normal.  Conjunctivae are normal. No scleral icterus. Neck supple.  Cardiovascular: Normal rate, regular rhythm.  Pulmonary/chest: Effort normal and breath sounds normal. No wheezing, rales or rhonchi. Abdominal: Soft, nondistended, nontender. Bowel sounds active throughout. There are no masses palpable. No hepatomegaly. Neurological: Alert and oriented to person place and time. Musculoskeletal: No edema Skin: Skin is warm and dry. No rashes noted.  Tye Savoy, NP  08/09/2022, 1:31 PM  Cc:  Debbrah Alar, NP

## 2022-08-09 NOTE — Patient Instructions (Addendum)
_______________________________________________________  If you are age 53 or older, your body mass index should be between 23-30. Your Body mass index is 16.22 kg/m. If this is out of the aforementioned range listed, please consider follow up with your Primary Care Provider.  If you are age 61 or younger, your body mass index should be between 19-25. Your Body mass index is 16.22 kg/m. If this is out of the aformentioned range listed, please consider follow up with your Primary Care Provider.   ________________________________________________________  The [facility] providers would like to encourage you to use [facility] to communicate with providers for non-urgent requests or questions.  Due to long hold times on the telephone, sending your provider a message by [facility] may be a faster and more efficient way to get a response.  Please allow 48 business hours for a response.  Please remember that this is for non-urgent requests.  _______________________________________________________  Your provider has requested that you go to the basement level for lab work before leaving today. Press "B" on the elevator. The lab is located at the first door on the left as you exit the elevator.  You have been scheduled for an MRI at [facility] on 08-15-2022. Your appointment time is 5pm. Please arrive to admitting (at main entrance of the hospital) at 4:30pm your appointment time for registration purposes. Please make certain not to have anything to eat or drink 6 hours prior to your test. In addition, if you have any metal in your body, have a pacemaker or defibrillator, please be sure to let your ordering physician know. This test typically takes 45 minutes to 1 hour to complete. Should you need to reschedule, please call [phone] to do so.   Trial of Virberzi 100 mg twice daily with food. Once starting Virberzi, hold imodium and lomotil.   Will contact you with stool studies results.    Call  us a couple of weeks after starting Viberzi. If not better we may need to proceed with evaluation of small intestine.    It was a pleasure to see you today!  Thank you for trusting me with your gastrointestinal care!

## 2022-08-10 ENCOUNTER — Other Ambulatory Visit: Payer: Medicaid Other

## 2022-08-10 ENCOUNTER — Ambulatory Visit: Payer: Medicaid Other | Admitting: Family

## 2022-08-10 DIAGNOSIS — K769 Liver disease, unspecified: Secondary | ICD-10-CM

## 2022-08-10 DIAGNOSIS — R1030 Lower abdominal pain, unspecified: Secondary | ICD-10-CM

## 2022-08-10 DIAGNOSIS — R634 Abnormal weight loss: Secondary | ICD-10-CM

## 2022-08-10 DIAGNOSIS — K529 Noninfective gastroenteritis and colitis, unspecified: Secondary | ICD-10-CM

## 2022-08-14 ENCOUNTER — Telehealth: Payer: Self-pay | Admitting: Nurse Practitioner

## 2022-08-14 LAB — CALPROTECTIN, FECAL: Calprotectin, Fecal: 1170 ug/g — ABNORMAL HIGH (ref 0–120)

## 2022-08-14 NOTE — Telephone Encounter (Signed)
Spoke with pt. Pt reports she can't sit up straight due to pain and rates pain as a 12/10. Pt also states she is still having fecal incontinence and has 5-8 episodes of diarrhea per day. Pt states that viberzi has not helped with diarrhea and tramadol hasn't helped with her pain.

## 2022-08-14 NOTE — Telephone Encounter (Signed)
Inbound call from patient stating medication prescribed at ov on 1/11 is not working and also would like to speak with someone regarding radiology. Patient states she is in a lot of pain today. Please advise.

## 2022-08-15 ENCOUNTER — Other Ambulatory Visit (HOSPITAL_COMMUNITY): Payer: Medicaid Other

## 2022-08-15 LAB — PANCREATIC ELASTASE, FECAL: Pancreatic Elastase-1, Stool: 103 mcg/g — ABNORMAL LOW

## 2022-08-15 NOTE — Progress Notes (Signed)
Agree with the assessment and plan as outlined by Paula Guenther, NP. ° °Deanie Jupiter, DO, FACG ° °

## 2022-08-16 ENCOUNTER — Telehealth: Payer: Self-pay

## 2022-08-16 ENCOUNTER — Other Ambulatory Visit: Payer: Self-pay

## 2022-08-16 DIAGNOSIS — R1030 Lower abdominal pain, unspecified: Secondary | ICD-10-CM

## 2022-08-16 DIAGNOSIS — K529 Noninfective gastroenteritis and colitis, unspecified: Secondary | ICD-10-CM

## 2022-08-16 DIAGNOSIS — R634 Abnormal weight loss: Secondary | ICD-10-CM

## 2022-08-16 MED ORDER — NA SULFATE-K SULFATE-MG SULF 17.5-3.13-1.6 GM/177ML PO SOLN: 1.0000 | Freq: Once | ORAL | 0 refills | Status: AC

## 2022-08-16 NOTE — Addendum Note (Signed)
Addended by: Berniece Salines A on: 08/16/2022 11:51 AM   Modules accepted: Orders

## 2022-08-16 NOTE — Addendum Note (Signed)
Addended by: Berniece Salines A on: 08/16/2022 01:38 PM   Modules accepted: Orders

## 2022-08-16 NOTE — Telephone Encounter (Signed)
-----  Message from Willia Craze, NP sent at 08/16/2022  8:37 AM EST ----- Very busy at hospital here. Her fecal calprotectin is high. I talked with Dr. Cathleen Corti. Please call patient and schedule for colonoscopy/ileoscopy and also a CT enterography to evaluate for IBD. Also check a CRP .   Ok to expedite the colonoscopy and use a 7:30 spot if needed.  Tell her we are trying to rule out a small bowel source of all the diarrhea  Thanks

## 2022-08-16 NOTE — Telephone Encounter (Signed)
Gave pt recommendations. Pt scheduled for colonoscopy on 08/22/22 with Dr. Bryan Lemma. Ambulatory referral placed and prep sent to pharmacy. Instructions sent to [facility] and mailed. Pt stated that she takes aspirin 325 mg but stopped it 10 days ago because she stopped taking everything that wasn't a prescription. Let pt know she should contact PCP for their recommendations about aspirin. Clearance requested from PCP. CT entero scheduled for 08/20/22 at 3:30 pm at [facility] imaging. Pt aware she needs to arrive at 2:30 pm and be NPO 4 hours prior. Also let pt know about CRP lab and let pt know she can come to lab at her convenience. Pt verbalized understanding.

## 2022-08-16 NOTE — Telephone Encounter (Signed)
   Misty Beck 1970/11/24 [id number]  Dear Lenna Sciara:  We have scheduled the above named patient for a(n) Colonoscopy procedure. Our records show that (s)he is on anticoagulation therapy.  Please advise as to whether the patient may come off their therapy of Aspirin 5 days prior to their procedure which is scheduled for 08/22/22.  Please route your response to Berniece Salines RN or fax response to [phone].  Sincerely,    [facility]

## 2022-08-17 ENCOUNTER — Telehealth: Payer: Self-pay | Admitting: Family

## 2022-08-17 NOTE — Telephone Encounter (Signed)
Hi Misty Beck, She is OK to proceed with planned colonoscopy from a medical standpoint.  I don't think she needs pre-op cardiac evaluation as she does not have a know cardiac history.   We will reach out to her re: aspirin therapy. Thanks!

## 2022-08-17 NOTE — Telephone Encounter (Addendum)
Please advise pt that I received notification from GI that she had discontinued her aspirin therapy.  I would recommend that she take aspirin '81mg'$  once daily until her colonoscopy on 1/24.  However after she completes her colonoscopy, I would recommend that she restart aspirin '325mg'$  daily for stroke prevention.

## 2022-08-17 NOTE — Telephone Encounter (Signed)
Yes, but she should go back up to '325mg'$  after the procedure.

## 2022-08-17 NOTE — Telephone Encounter (Signed)
Patient advised of provider's comments and recommendations. She verbalized understanding and said she will resume 325 mg aspirin after procedure.

## 2022-08-17 NOTE — Telephone Encounter (Signed)
Spoke with pt and let pt know she should take aspirin 81 mg starting today and continue the 81 mg aspirin just until procedure. Pt verbalized understanding and had no other concerns at end of call.

## 2022-08-17 NOTE — Telephone Encounter (Signed)
Sorry I need to make a correction, instead of holding Aspirin 325 mg for 5 days is it okay if she goes down to 81 mg  of Aspirin for 5 days prior to procedure? Thank you!

## 2022-08-19 ENCOUNTER — Encounter: Payer: Self-pay | Admitting: Certified Registered Nurse Anesthetist

## 2022-08-20 ENCOUNTER — Ambulatory Visit
Admission: RE | Admit: 2022-08-20 | Discharge: 2022-08-20 | Disposition: A | Discharge status: Home / Self Care | Payer: Medicaid Other | Source: Ambulatory Visit | Attending: Nurse Practitioner | Admitting: Nurse Practitioner

## 2022-08-20 DIAGNOSIS — R1030 Lower abdominal pain, unspecified: Secondary | ICD-10-CM

## 2022-08-20 DIAGNOSIS — R634 Abnormal weight loss: Secondary | ICD-10-CM

## 2022-08-20 DIAGNOSIS — K529 Noninfective gastroenteritis and colitis, unspecified: Secondary | ICD-10-CM

## 2022-08-20 MED ORDER — IOPAMIDOL (ISOVUE-370) INJECTION 76%
85.0000 mL | Freq: Once | INTRAVENOUS | Status: AC | PRN
  Administered 2022-08-20: 85 mL via INTRAVENOUS

## 2022-08-22 ENCOUNTER — Telehealth: Payer: Self-pay | Admitting: Pharmacy Technician

## 2022-08-22 ENCOUNTER — Ambulatory Visit (AMBULATORY_SURGERY_CENTER): Payer: Medicaid Other | Admitting: Gastroenterology

## 2022-08-22 ENCOUNTER — Encounter: Payer: Self-pay | Admitting: Gastroenterology

## 2022-08-22 VITALS — BP 123/66 | HR 68 | Temp 95.0°F | Resp 16 | Ht 64.0 in | Wt 94.0 lb

## 2022-08-22 DIAGNOSIS — K529 Noninfective gastroenteritis and colitis, unspecified: Secondary | ICD-10-CM

## 2022-08-22 DIAGNOSIS — K52831 Collagenous colitis: Secondary | ICD-10-CM

## 2022-08-22 DIAGNOSIS — R1030 Lower abdominal pain, unspecified: Secondary | ICD-10-CM

## 2022-08-22 DIAGNOSIS — R634 Abnormal weight loss: Secondary | ICD-10-CM

## 2022-08-22 MED ORDER — SODIUM CHLORIDE 0.9 % IV SOLN: 500.0000 mL | INTRAVENOUS | Status: DC

## 2022-08-22 MED ORDER — PANCRELIPASE (LIP-PROT-AMYL) 12000-38000 UNITS PO CPEP: 36000.0000 [IU] | ORAL_CAPSULE | Freq: Four times a day (QID) | ORAL | Status: DC

## 2022-08-22 NOTE — Op Note (Signed)
[facility] Patient Name: Misty Beck Procedure Date: 08/22/2022 7:56 AM MRN: [id number] Endoscopist: Gerrit Heck , MD, [id number] Age: 52 Referring MD:  Date of Birth: 12-19-1970 Gender: Female Account #: [id number] Procedure:                Colonoscopy Indications:              Change in bowel habits, Chronic diarrhea, Weight                            loss                           Elevated fecal calprotectin, B12 deficiency                           CT Enterography was normal. No response to trial of                            Viberzi. Medicines:                Monitored Anesthesia Care Procedure:                Pre-Anesthesia Assessment:                           - Prior to the procedure, a History and Physical                            was performed, and patient medications and                            allergies were reviewed. The patient's tolerance of                            previous anesthesia was also reviewed. The risks                            and benefits of the procedure and the sedation                            options and risks were discussed with the patient.                            All questions were answered, and informed consent                            was obtained. Prior Anticoagulants: The patient has                            taken no anticoagulant or antiplatelet agents                            except for aspirin. ASA Grade Assessment: III - A  patient with severe systemic disease. After                            reviewing the risks and benefits, the patient was                            deemed in satisfactory condition to undergo the                            procedure.                           After obtaining informed consent, the colonoscope                            was passed under direct vision. Throughout the                            procedure, the patient's blood pressure, pulse, and                             oxygen saturations were monitored continuously. The                            0405 PCF-H190TL Slim SB Colonoscope was introduced                            through the anus and advanced to the 10 cm into the                            ileum. The colonoscopy was performed without                            difficulty. The patient tolerated the procedure                            well. The quality of the bowel preparation was                            fair. The terminal ileum, ileocecal valve,                            appendiceal orifice, and rectum were photographed. Scope In: 8:00:39 AM Scope Out: 8:19:51 AM Scope Withdrawal Time: 0 hours 11 minutes 48 seconds  Total Procedure Duration: 0 hours 19 minutes 12 seconds  Findings:                 The perianal and digital rectal examinations were                            normal.                           Semi-liquid stool was found in the entire colon.  Lavage of the area was performed using copious                            amounts of tap water, resulting in clearance with                            fair visualization.                           The visualized mucosa was otherwise normal                            appearing throughout the entire colon. No areas of                            mucosal erythema, edema, erosions, or ulceration                            noted. Biopsies were taken throughout the colon                            with a cold forceps for histology. Estimated blood                            loss was minimal.                           The terminal ileum appeared normal.                           The retroflexed view of the distal rectum and anal                            verge was normal and showed no anal or rectal                            abnormalities. Complications:            No immediate complications. Estimated Blood Loss:     Estimated blood loss was  minimal. Impression:               - Preparation of the colon was fair.                           - Stool in the entire examined colon.                           - Normal mucosa in the entire examined colon.                            Biopsied.                           - The examined portion of the ileum was normal.                           -  The distal rectum and anal verge are normal on                            retroflexion view. Recommendation:           - Patient has a contact number available for                            emergencies. The signs and symptoms of potential                            delayed complications were discussed with the                            patient. Return to normal activities tomorrow.                            Written discharge instructions were provided to the                            patient.                           - Resume previous diet.                           - Continue present medications.                           - Await pathology results.                           - Follow-up with Tye Savoy in the GI clinic                            PRN.                           - Use fiber, for example Citrucel, Fibercon, Konsyl                            or Metamucil.                           - Start Creon 36,000 unit capsules. Take 2 caps                            with meals and 1 with snacks. Gerrit Heck, MD 08/22/2022 8:28:23 AM

## 2022-08-22 NOTE — Progress Notes (Signed)
Report given to PACU, vss 

## 2022-08-22 NOTE — Progress Notes (Signed)
0122 MOEx4, sticks tongue out to command, and follows command. vss

## 2022-08-22 NOTE — Progress Notes (Signed)
GASTROENTEROLOGY PROCEDURE H&P NOTE   Primary Care Physician: Debbrah Alar, NP    Reason for Procedure:  Chronic diarrhea, fecal incontinence, lower abdominal pain, B12 deficiency anemia  Plan:    Colonoscopy  Patient is appropriate for endoscopic procedure(s) in the ambulatory ([place]) setting.  The nature of the procedure, as well as the risks, benefits, and alternatives were carefully and thoroughly reviewed with the patient. Ample time for discussion and questions allowed. The patient understood, was satisfied, and agreed to proceed.     HPI: Misty Beck is a 52 y.o. female who presents for colonoscopy for evaluation of diarrhea, lower abdominal pain, and new B12 deficiency anemia.  No change with Viberzi.  Elevated fecal calprotectin at 1170, which was normal 1 year ago.  Low pancreatic elastase at 103.  Was last seen in the GI clinic on 08/09/2022 by Tye Savoy.  please see that note for additional details.  CT enterography completed on 08/20/2022 and notable for a normal-appearing GI tract without any areas of inflammation, stricture, dilatation, including normal TI.  2 cm indeterminate lesion in the posterior right hepatic lobe with recommendation for MRI liver for further characterization.   Hospital admission in 07/2020 course reviewed. Was seen by inpatient Neurology with the following "52 year old with 2 episodes of Left-sided numbness weakness. She received TPA with both events. MRI imaging has been negative for acute ischemia in both event. Yesterday's MRI showed no acute ischemia despite ongoing focal deficits.  Overall patient's recurrent episodes of focal numbness and weakness do not seem to be a result of cerebral ischemia. There is no evidence of stroke. This is not a TIA. She has a history of migraine headache. She did have some headache following her last presentation 2 weeks ago. 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 Will discontinue dual antiplatelets. Would be okay to continue aspirin 81 mg. "  She apparently had another episode of facial droop in November.  Did not seek medical attention for this.  Did see her PCM a few days later who ordered CT angio head on 06/12/2022 which demonstrates no acute intracranial pathology no evidence of acute or prior infarct, normal intracranial vasculature.  She has never seen Neurology as outpatient as recommended. Has been taking ASA 325 mg daily since November, to 81 mg daily for procedures today.  We did discuss the possibility for increased risks of sedation in patients with recent stroke, to include transient hypotension or medication injury.  However, based on review of available documentation, there is no clear evidence that she ever had a stroke in the past, and the Neurology input in fact receives either CVA or TIA, indicated with complex migraine or conversion.  After long discussion with the patient, she would like to proceed with procedure today with plan to keep sedation complaint side and maintain normotensive status.  Past Medical History:  Diagnosis Date   Asthma    Depression with anxiety 04/20/2021   Referral source Dr Doristine Section bonsu   Diarrhea    referral source   Hyperlipidemia    referral source   Insomnia 04/20/2021   referral source Dr Gayleen Orem   Received intravenous tissue plasminogen activator (tPA) in emergency department 10/24/2021   Stroke [facility])    Dec, 2021, Jan 2022    Past Surgical History:  Procedure Laterality Date   APPENDECTOMY  1992   left shoulder surgery     referral source Dr Roney Jaffe   PARTIAL HYSTERECTOMY  2009    Prior to Admission medications   Medication Sig Start Date End Date Taking? Authorizing Provider  aspirin EC 325 MG tablet Take 1 tablet (325 mg total) by mouth daily. 06/12/22   Debbrah Alar, NP  atorvastatin (LIPITOR) 10 MG tablet Take 1 tablet (10 mg total) by mouth daily.  04/18/22   Debbrah Alar, NP  cyanocobalamin (VITAMIN B12) 1000 MCG/ML injection Inject 1 mL (1,000 mcg total) into the muscle once a week. 07/19/22   Debbrah Alar, NP  diclofenac Sodium (VOLTAREN) 1 % GEL APPLY 2 GRAMS TOPICALLY 4 TIMES DAILY 04/16/22   Debbrah Alar, NP  dicyclomine (BENTYL) 10 MG capsule Take 1 capsule (10 mg total) by mouth every 6 (six) hours as needed for spasms. 05/23/21   Zayn Selley V, DO  diphenoxylate-atropine (LOMOTIL) 2.5-0.025 MG tablet Take 1 tablet by mouth 4 (four) times daily as needed for up to 30 doses for diarrhea or loose stools. 07/09/22   Debbrah Alar, NP  Eluxadoline (VIBERZI) 100 MG TABS Take 1 tablet (100 mg total) by mouth 2 (two) times daily. 08/09/22   Willia Craze, NP  gabapentin (NEURONTIN) 100 MG capsule Take 1 capsule (100 mg total) by mouth 3 (three) times daily. 04/16/22   Debbrah Alar, NP  LORazepam (ATIVAN) 0.5 MG tablet Take 0.5 mg by mouth every 8 (eight) hours.    [provider]  methocarbamol (ROBAXIN) 500 MG tablet Take 1 tablet (500 mg total) by mouth every 8 (eight) hours as needed for muscle spasms. 12/06/21   Meredith Pel, MD  ondansetron (ZOFRAN-ODT) 4 MG disintegrating tablet Take 1 tablet (4 mg total) by mouth every 8 (eight) hours as needed for nausea or vomiting. 04/16/22   Debbrah Alar, NP  sertraline (ZOLOFT) 100 MG tablet Take 1 tablet (100 mg total) by mouth daily. 10/24/21   Marrian Salvage, FNP  traMADol (ULTRAM) 50 MG tablet Take 1 tablet (50 mg total) by mouth every 12 (twelve) hours as needed. 08/09/22   Willia Craze, NP  traZODone (DESYREL) 100 MG tablet Take 1 tablet (100 mg total) by mouth at bedtime as needed for sleep. 10/24/21   Marrian Salvage, FNP    Current Outpatient Medications  Medication Sig Dispense Refill   aspirin EC 325 MG tablet Take 1 tablet (325 mg total) by mouth daily.     atorvastatin (LIPITOR) 10 MG tablet Take 1 tablet  (10 mg total) by mouth daily. 90 tablet 1   cyanocobalamin (VITAMIN B12) 1000 MCG/ML injection Inject 1 mL (1,000 mcg total) into the muscle once a week. 10 mL 0   diclofenac Sodium (VOLTAREN) 1 % GEL APPLY 2 GRAMS TOPICALLY 4 TIMES DAILY 400 g 1   dicyclomine (BENTYL) 10 MG capsule Take 1 capsule (10 mg total) by mouth every 6 (six) hours as needed for spasms. 60 capsule 3   diphenoxylate-atropine (LOMOTIL) 2.5-0.025 MG tablet Take 1 tablet by mouth 4 (four) times daily as needed for up to 30 doses for diarrhea or loose stools. 30 tablet 0   Eluxadoline (VIBERZI) 100 MG TABS Take 1 tablet (100 mg total) by mouth 2 (two) times daily. 180 tablet 0   gabapentin (NEURONTIN) 100 MG capsule Take 1 capsule (100 mg total) by mouth 3 (three) times daily. 90 capsule 1   LORazepam (ATIVAN) 0.5 MG tablet Take 0.5 mg by mouth every 8 (eight) hours.     methocarbamol (ROBAXIN) 500 MG tablet Take 1 tablet (500 mg total) by mouth  every 8 (eight) hours as needed for muscle spasms. 35 tablet 0   ondansetron (ZOFRAN-ODT) 4 MG disintegrating tablet Take 1 tablet (4 mg total) by mouth every 8 (eight) hours as needed for nausea or vomiting. 30 tablet 0   sertraline (ZOLOFT) 100 MG tablet Take 1 tablet (100 mg total) by mouth daily. 90 tablet 3   traMADol (ULTRAM) 50 MG tablet Take 1 tablet (50 mg total) by mouth every 12 (twelve) hours as needed. 30 tablet 0   traZODone (DESYREL) 100 MG tablet Take 1 tablet (100 mg total) by mouth at bedtime as needed for sleep. 90 tablet 3   Current Facility-Administered Medications  Medication Dose Route Frequency Provider Last Rate Last Admin   0.9 %  sodium chloride infusion  500 mL Intravenous Continuous Sparrow Siracusa V, DO        Allergies as of 08/22/2022   (No Known Allergies)    Family History  Problem Relation Age of Onset   Stroke Mother    Cancer Mother    Colon polyps Mother    Skin cancer Mother    Breast cancer Mother 53   Kidney disease Father     Hyperlipidemia Father    COPD Father    Heart disease Father    Colon cancer Neg Hx    Rectal cancer Neg Hx    Stomach cancer Neg Hx     Social History   Socioeconomic History   Marital status: Divorced    Spouse name: Not on file   Number of children: 2   Years of education: Not on file   Highest education level: Not on file  Occupational History   Not on file  Tobacco Use   Smoking status: Former    Types: Cigarettes   Smokeless tobacco: Never  Substance and Sexual Activity   Alcohol use: Not Currently   Drug use: Not Currently   Sexual activity: Not on file  Other Topics Concern   Not on file  Social History Narrative   Not on file   Social Determinants of Health   Financial Resource Strain: Not on file  Food Insecurity: Not on file  Transportation Needs: Not on file  Physical Activity: Not on file  Stress: Not on file  Social Connections: Not on file  Intimate Partner Violence: Not on file    Physical Exam: Vital signs in last 24 hours: '@There'$  were no vitals taken for this visit. GEN: NAD EYE: Sclerae anicteric ENT: MMM CV: Non-tachycardic Pulm: CTA b/l GI: Soft, NT/ND NEURO:  Alert & Oriented x 3   Gerrit Heck, DO [facility]   08/22/2022 7:17 AM

## 2022-08-22 NOTE — Patient Instructions (Signed)
Start your Creon today as ordered.  It is at your pharmacy. Read all of the handouts given to you by your recovery room nurse.  Resume all of your previous medications today.  Follow-up with Tye Savoy in the office as needed.  YOU HAD AN ENDOSCOPIC PROCEDURE TODAY AT [facility]:   Refer to the procedure report that was given to you for any specific questions about what was found during the examination.  If the procedure report does not answer your questions, please call your gastroenterologist to clarify.  If you requested that your care partner not be given the details of your procedure findings, then the procedure report has been included in a sealed envelope for you to review at your convenience later.  YOU SHOULD EXPECT: Some feelings of bloating in the abdomen. Passage of more gas than usual.  Walking can help get rid of the air that was put into your GI tract during the procedure and reduce the bloating. If you had a lower endoscopy (such as a colonoscopy or flexible sigmoidoscopy) you may notice spotting of blood in your stool or on the toilet paper. If you underwent a bowel prep for your procedure, you may not have a normal bowel movement for a few days.  Please Note:  You might notice some irritation and congestion in your nose or some drainage.  This is from the oxygen used during your procedure.  There is no need for concern and it should clear up in a day or so.  SYMPTOMS TO REPORT IMMEDIATELY:  Following lower endoscopy (colonoscopy or flexible sigmoidoscopy):  Excessive amounts of blood in the stool  Significant tenderness or worsening of abdominal pains  Swelling of the abdomen that is new, acute  Fever of 100F or higher   For urgent or emergent issues, a gastroenterologist can be reached at any hour by calling [phone]. Do not use MyChart messaging for urgent concerns.    DIET:  We do recommend a small meal at first, but then you may proceed to your  regular diet.  Drink plenty of fluids but you should avoid alcoholic beverages for 24 hours.  Try to use fiber : citrucel, benefiber, Konsyl,or metamucil daily.  ACTIVITY:  You should plan to take it easy for the rest of today and you should NOT DRIVE or use heavy machinery until tomorrow (because of the sedation medicines used during the test).    FOLLOW UP: Our staff will call the number listed on your records the next business day following your procedure.  We will call around 7:15- 8:00 am to check on you and address any questions or concerns that you may have regarding the information given to you following your procedure. If we do not reach you, we will leave a message.     If any biopsies were taken you will be contacted by phone or by letter within the next 1-3 weeks.  Please call us at [phone] if you have not heard about the biopsies in 3 weeks.    SIGNATURES/CONFIDENTIALITY: You and/or your care partner have signed paperwork which will be entered into your electronic medical record.  These signatures attest to the fact that that the information above on your After Visit Summary has been reviewed and is understood.  Full responsibility of the confidentiality of this discharge information lies with you and/or your care-partner.

## 2022-08-22 NOTE — Telephone Encounter (Signed)
Patient Advocate Encounter  Received notification from [facility] that prior authorization for VIBERZI is required.   PA submitted on 1.24.24 Key [id number] Status is pending

## 2022-08-22 NOTE — Progress Notes (Signed)
Upon adm to recovery, no signs of stroke noted.  Upon discharge drom recovery room, pt does not appear to have any stroke symptoms.  States feels okay.  No pain noted.  Light blood noted when pt stood up.  Stopped by the time pt put the pad on.  Patient instructed to call us if it gets darker with clots or she could see a lot in the bowl.  Patient given a pad. Light blood noted. Small amount of serous blood noted.  Instructed to call us if the bleeding comes back.  Any questions or concerns call us.  Good feedback from patient and husband.

## 2022-08-22 NOTE — Progress Notes (Signed)
Called to room to assist during endoscopic procedure.  Patient ID and intended procedure confirmed with present staff. Received instructions for my participation in the procedure from the performing physician.  

## 2022-08-23 ENCOUNTER — Telehealth: Payer: Self-pay | Admitting: *Deleted

## 2022-08-23 NOTE — Telephone Encounter (Signed)
  Follow up Call-     08/22/2022    7:12 AM 07/11/2021   10:20 AM  Call back number  Post procedure Call Back phone  # [phone]  Permission to leave phone message Yes Yes     Patient questions:  Do you have a fever, pain , or abdominal swelling? No. Pain Score  0 *  Have you tolerated food without any problems? Yes.    Have you been able to return to your normal activities? Yes.    Do you have any questions about your discharge instructions: Diet   No. Medications  No. Follow up visit  No.  Do you have questions or concerns about your Care? No.  Actions: * If pain score is 4 or above: No action needed, pain <4.

## 2022-08-23 NOTE — Telephone Encounter (Signed)
Refer to other phone notes.  Workup will continue

## 2022-08-24 ENCOUNTER — Telehealth: Payer: Self-pay | Admitting: Nurse Practitioner

## 2022-08-24 NOTE — Telephone Encounter (Signed)
Spoke with pt. See Ct enterography result note.

## 2022-08-24 NOTE — Telephone Encounter (Signed)
Patient is returning call. Please advise? 

## 2022-08-25 ENCOUNTER — Other Ambulatory Visit: Payer: Medicaid Other

## 2022-08-27 ENCOUNTER — Ambulatory Visit (HOSPITAL_COMMUNITY): Payer: Medicaid Other

## 2022-08-27 ENCOUNTER — Other Ambulatory Visit: Payer: Self-pay

## 2022-08-27 MED ORDER — BUDESONIDE 3 MG PO CPEP: ORAL_CAPSULE | ORAL | 0 refills | Status: AC

## 2022-08-27 MED ORDER — PANCRELIPASE (LIP-PROT-AMYL) 36000-114000 UNITS PO CPEP: ORAL_CAPSULE | ORAL | 11 refills | Status: DC

## 2022-09-05 ENCOUNTER — Ambulatory Visit: Payer: Medicaid Other | Admitting: Cardiology

## 2022-10-05 ENCOUNTER — Other Ambulatory Visit: Payer: Self-pay

## 2022-10-05 NOTE — Telephone Encounter (Signed)
Requesting: lorazepam 0.'5mg'$   Contract:04/16/22 UDS:04/16/22 Last Visit: 08/01/22 Next Visit: None Last Refill: Unsure if you have refilled before  Please Advise

## 2022-10-06 MED ORDER — LORAZEPAM 0.5 MG PO TABS: 0.5000 mg | ORAL_TABLET | Freq: Three times a day (TID) | ORAL | 0 refills | Status: DC

## 2022-10-15 ENCOUNTER — Other Ambulatory Visit: Payer: Self-pay | Admitting: Family

## 2022-10-17 ENCOUNTER — Other Ambulatory Visit: Payer: Self-pay

## 2022-10-17 ENCOUNTER — Other Ambulatory Visit: Payer: Self-pay | Admitting: Family

## 2022-10-17 ENCOUNTER — Telehealth: Payer: Self-pay | Admitting: Family

## 2022-10-17 MED ORDER — SERTRALINE HCL 100 MG PO TABS: 100.0000 mg | ORAL_TABLET | Freq: Every day | ORAL | 0 refills | Status: DC

## 2022-10-17 MED ORDER — TRAZODONE HCL 100 MG PO TABS: 100.0000 mg | ORAL_TABLET | Freq: Every evening | ORAL | 0 refills | Status: DC | PRN

## 2022-10-17 MED ORDER — PROMETHAZINE HCL 12.5 MG PO TABS: 12.5000 mg | ORAL_TABLET | Freq: Three times a day (TID) | ORAL | 0 refills | Status: DC | PRN

## 2022-10-17 NOTE — Telephone Encounter (Signed)
Patient called in to follow up on her medication refills for Sertraline and Trazadone. Pt has been out of Sertraline for a week and is not doing that great. Patient was prescribed promethazine from an urgent care provider so she wants to know if we can call this in for her as it works really well. Pt would like medications sent to [facility] on [address]. Pt would like a call to let her know when this is called in.

## 2022-10-17 NOTE — Telephone Encounter (Signed)
Pt would like promethazine as well.

## 2022-10-17 NOTE — Telephone Encounter (Signed)
Rxs sent

## 2022-10-18 NOTE — Telephone Encounter (Signed)
Pt notified that rxs has been sent in.

## 2022-11-22 ENCOUNTER — Emergency Department (HOSPITAL_BASED_OUTPATIENT_CLINIC_OR_DEPARTMENT_OTHER)
Admission: EM | Admit: 2022-11-22 | Discharge: 2022-11-22 | Disposition: A | Discharge status: Home / Self Care | Payer: Medicaid Other | Attending: Emergency Medicine | Admitting: Emergency Medicine

## 2022-11-22 ENCOUNTER — Other Ambulatory Visit: Payer: Self-pay

## 2022-11-22 ENCOUNTER — Emergency Department (HOSPITAL_BASED_OUTPATIENT_CLINIC_OR_DEPARTMENT_OTHER): Payer: Medicaid Other

## 2022-11-22 ENCOUNTER — Encounter (HOSPITAL_BASED_OUTPATIENT_CLINIC_OR_DEPARTMENT_OTHER): Payer: Self-pay | Admitting: Pediatrics

## 2022-11-22 DIAGNOSIS — Z7982 Long term (current) use of aspirin: Secondary | ICD-10-CM | POA: Diagnosis not present

## 2022-11-22 DIAGNOSIS — R1110 Vomiting, unspecified: Secondary | ICD-10-CM | POA: Insufficient documentation

## 2022-11-22 DIAGNOSIS — R5383 Other fatigue: Secondary | ICD-10-CM | POA: Insufficient documentation

## 2022-11-22 DIAGNOSIS — Z1152 Encounter for screening for COVID-19: Secondary | ICD-10-CM | POA: Diagnosis not present

## 2022-11-22 DIAGNOSIS — R531 Weakness: Secondary | ICD-10-CM | POA: Insufficient documentation

## 2022-11-22 DIAGNOSIS — R059 Cough, unspecified: Secondary | ICD-10-CM | POA: Diagnosis not present

## 2022-11-22 DIAGNOSIS — J40 Bronchitis, not specified as acute or chronic: Secondary | ICD-10-CM

## 2022-11-22 LAB — CBC WITH DIFFERENTIAL/PLATELET
Abs Immature Granulocytes: 0.03 10*3/uL (ref 0.00–0.07)
Basophils Absolute: 0.1 10*3/uL (ref 0.0–0.1)
Basophils Relative: 1 %
Eosinophils Absolute: 0.1 10*3/uL (ref 0.0–0.5)
Eosinophils Relative: 1 %
HCT: 42.1 % (ref 36.0–46.0)
Hemoglobin: 13.6 g/dL (ref 12.0–15.0)
Immature Granulocytes: 0 %
Lymphocytes Relative: 20 %
Lymphs Abs: 1.5 10*3/uL (ref 0.7–4.0)
MCH: 29.8 pg (ref 26.0–34.0)
MCHC: 32.3 g/dL (ref 30.0–36.0)
MCV: 92.1 fL (ref 80.0–100.0)
Monocytes Absolute: 0.7 10*3/uL (ref 0.1–1.0)
Monocytes Relative: 9 %
Neutro Abs: 5.2 10*3/uL (ref 1.7–7.7)
Neutrophils Relative %: 69 %
Platelets: 371 10*3/uL (ref 150–400)
RBC: 4.57 MIL/uL (ref 3.87–5.11)
RDW: 13.1 % (ref 11.5–15.5)
WBC: 7.6 10*3/uL (ref 4.0–10.5)
nRBC: 0 % (ref 0.0–0.2)

## 2022-11-22 LAB — RESP PANEL BY RT-PCR (RSV, FLU A&B, COVID)  RVPGX2
Influenza A by PCR: NEGATIVE
Influenza B by PCR: NEGATIVE
Resp Syncytial Virus by PCR: NEGATIVE
SARS Coronavirus 2 by RT PCR: NEGATIVE

## 2022-11-22 LAB — MAGNESIUM: Magnesium: 1.8 mg/dL (ref 1.7–2.4)

## 2022-11-22 LAB — BASIC METABOLIC PANEL
Anion gap: 13 (ref 5–15)
BUN: 12 mg/dL (ref 6–20)
CO2: 28 mmol/L (ref 22–32)
Calcium: 10.2 mg/dL (ref 8.9–10.3)
Chloride: 98 mmol/L (ref 98–111)
Creatinine, Ser: 1.14 mg/dL — ABNORMAL HIGH (ref 0.44–1.00)
GFR, Estimated: 58 mL/min — ABNORMAL LOW (ref >60)
Glucose, Bld: 93 mg/dL (ref 70–99)
Potassium: 4.6 mmol/L (ref 3.5–5.1)
Sodium: 139 mmol/L (ref 135–145)

## 2022-11-22 MED ORDER — SODIUM CHLORIDE 0.9 % IV BOLUS
500.0000 mL | Freq: Once | INTRAVENOUS | Status: AC
  Administered 2022-11-22: 500 mL via INTRAVENOUS

## 2022-11-22 MED ORDER — ALBUTEROL SULFATE HFA 108 (90 BASE) MCG/ACT IN AERS
2.0000 | INHALATION_SPRAY | Freq: Once | RESPIRATORY_TRACT | Status: AC
  Administered 2022-11-22: 2 via RESPIRATORY_TRACT
  Filled 2022-11-22: qty 6.7

## 2022-11-22 MED ORDER — DOXYCYCLINE HYCLATE 100 MG PO CAPS: 100.0000 mg | ORAL_CAPSULE | Freq: Two times a day (BID) | ORAL | 0 refills | Status: DC

## 2022-11-22 NOTE — ED Notes (Signed)
Patient transported to X-ray 

## 2022-11-22 NOTE — ED Provider Notes (Signed)
[place] [facility] Provider Note   CSN: [id number] Arrival date & time: 11/22/22  1001     History  Chief Complaint  Patient presents with   URI    Misty Beck is a 52 y.o. female.  HPI   52 year old female presents emergency department with cough for 2 weeks.  Patient states the cough is now profuse, sometimes ends with vomiting.  The cough is productive of green phlegm.  She has intermittent chills.  Has tried over-the-counter medication with no relief.  Now for the past couple days she is experiencing extreme fatigue, generalized numbness throughout her entire body, lightheadedness.  She denies any chest pain or back pain.  No headache or other acute neurosymptoms.  No documented fever.  Home COVID test was negative.  No swelling of her lower extremities.  Home Medications Prior to Admission medications   Medication Sig Start Date End Date Taking? Authorizing Provider  promethazine (PHENERGAN) 12.5 MG tablet Take 1 tablet (12.5 mg total) by mouth every 8 (eight) hours as needed for nausea or vomiting. 10/17/22   Worthy Rancher B, FNP  aspirin EC 325 MG tablet Take 1 tablet (325 mg total) by mouth daily. 06/12/22   Sandford Craze, NP  atorvastatin (LIPITOR) 10 MG tablet Take 1 tablet (10 mg total) by mouth daily. 04/18/22   Sandford Craze, NP  cyanocobalamin (VITAMIN B12) 1000 MCG/ML injection Inject 1 mL (1,000 mcg total) into the muscle once a week. 07/19/22   Sandford Craze, NP  diclofenac Sodium (VOLTAREN) 1 % GEL APPLY 2 GRAMS TOPICALLY 4 TIMES DAILY 04/16/22   Sandford Craze, NP  dicyclomine (BENTYL) 10 MG capsule Take 1 capsule (10 mg total) by mouth every 6 (six) hours as needed for spasms. 05/23/21   Cirigliano, Vito V, DO  diphenoxylate-atropine (LOMOTIL) 2.5-0.025 MG tablet Take 1 tablet by mouth 4 (four) times daily as needed for up to 30 doses for diarrhea or loose stools. 07/09/22   Sandford Craze, NP   Eluxadoline (VIBERZI) 100 MG TABS Take 1 tablet (100 mg total) by mouth 2 (two) times daily. 08/09/22   Meredith Pel, NP  gabapentin (NEURONTIN) 100 MG capsule Take 1 capsule (100 mg total) by mouth 3 (three) times daily. 04/16/22   Sandford Craze, NP  LORazepam (ATIVAN) 0.5 MG tablet Take 1 tablet (0.5 mg total) by mouth every 8 (eight) hours. 10/06/22   Sandford Craze, NP  methocarbamol (ROBAXIN) 500 MG tablet Take 1 tablet (500 mg total) by mouth every 8 (eight) hours as needed for muscle spasms. 12/06/21   Cammy Copa, MD  ondansetron (ZOFRAN-ODT) 4 MG disintegrating tablet Take 1 tablet (4 mg total) by mouth every 8 (eight) hours as needed for nausea or vomiting. 04/16/22   Sandford Craze, NP  sertraline (ZOLOFT) 100 MG tablet Take 1 tablet (100 mg total) by mouth daily. 10/17/22   Sandford Craze, NP  traMADol (ULTRAM) 50 MG tablet Take 1 tablet (50 mg total) by mouth every 12 (twelve) hours as needed. 08/09/22   Meredith Pel, NP  traZODone (DESYREL) 100 MG tablet Take 1 tablet (100 mg total) by mouth at bedtime as needed for sleep. 10/17/22   Sandford Craze, NP      Allergies    Patient has no known allergies.    Review of Systems   Review of Systems  Constitutional:  Positive for appetite change, chills and fatigue. Negative for fever.  Respiratory:  Positive for cough and shortness  of breath. Negative for chest tightness and wheezing.   Cardiovascular:  Negative for chest pain.  Gastrointestinal:  Positive for vomiting. Negative for abdominal pain and diarrhea.  Genitourinary:  Negative for flank pain.  Musculoskeletal:  Negative for back pain.  Skin:  Negative for rash.  Neurological:  Positive for light-headedness and numbness. Negative for headaches.    Physical Exam Updated Vital Signs BP (!) 129/96   Pulse 80   Temp 98.2 F (36.8 C) (Oral)   Resp 17   Ht  (1.626 m)   Wt 44.5 kg   SpO2 100%   BMI 16.82 kg/m  Physical  Exam Vitals and nursing note reviewed.  Constitutional:      General: She is not in acute distress.    Appearance: Normal appearance.  HENT:     Head: Normocephalic.     Mouth/Throat:     Mouth: Mucous membranes are moist.  Cardiovascular:     Rate and Rhythm: Normal rate.  Pulmonary:     Effort: Pulmonary effort is normal. No respiratory distress.     Breath sounds: No rales.     Comments: Scattered wheezes Abdominal:     Palpations: Abdomen is soft.     Tenderness: There is no abdominal tenderness.  Skin:    General: Skin is warm.  Neurological:     Mental Status: She is alert and oriented to person, place, and time. Mental status is at baseline.     Motor: No weakness.     Comments: States that the sensory of her entire body feels lefts but no focal numbness that I can appreciate on exam  Psychiatric:        Mood and Affect: Mood normal.     ED Results / Procedures / Treatments   Labs (all labs ordered are listed, but only abnormal results are displayed) Labs Reviewed  RESP PANEL BY RT-PCR (RSV, FLU A&B, COVID)  [id number]  CBC WITH DIFFERENTIAL/PLATELET  BASIC METABOLIC PANEL  MAGNESIUM    EKG None  Radiology No results found.  Procedures Procedures    Medications Ordered in ED Medications  sodium chloride 0.9 % bolus 500 mL (has no administration in time range)    ED Course/ Medical Decision Making/ A&P                             Medical Decision Making Amount and/or Complexity of Data Reviewed Labs: ordered. Radiology: ordered.  Risk Prescription drug management.  52 year old female presents emergency department with ongoing intermittently productive cough, generalized weakness and fatigue.  Vitals are stable on arrival.  She is fatigued appearing but not acutely ill.  Blood work shows mild dehydration but no other acute findings.  Chest x-ray is clear.  I believe she is suffering from URI/bacterial bronchitis and we will treat her  symptomatically as such.  After IV fluids patient states that she feels much better and has no acute complaints.  Patient at this time appears safe and stable for discharge and close outpatient follow up. Discharge plan and strict return to ED precautions discussed, patient verbalizes understanding and agreement.        Final Clinical Impression(s) / ED Diagnoses Final diagnoses:  None    Rx / DC Orders ED Discharge Orders     None         Rozelle Logan, DO 11/22/22 1504

## 2022-11-22 NOTE — Discharge Instructions (Addendum)
You have been seen and discharged from the emergency department.  You are diagnosed with bronchitis.  You are being treated with an inhaler and antibiotics.  You were also noted to have dehydration, you are treated with IV fluids.  Follow-up with your primary provider for further evaluation and further care. Take home medications as prescribed. If you have any worsening symptoms or further concerns for your health please return to an emergency department for further evaluation.

## 2022-11-22 NOTE — ED Notes (Signed)
Pt has a urine in lab.

## 2022-11-22 NOTE — ED Triage Notes (Signed)
C/O feeling sick with URI symptoms x 3 days, takes Nyquil and mucinex and its not helping. C/O dizziness along with generalized numbness the whole time, along with dry and productive cough with green mucous. Stated took some OTC meds mucinex, this morning.

## 2022-12-18 ENCOUNTER — Other Ambulatory Visit: Payer: Self-pay | Admitting: Family

## 2022-12-27 ENCOUNTER — Emergency Department (HOSPITAL_BASED_OUTPATIENT_CLINIC_OR_DEPARTMENT_OTHER): Payer: Medicaid Other

## 2022-12-27 ENCOUNTER — Encounter (HOSPITAL_BASED_OUTPATIENT_CLINIC_OR_DEPARTMENT_OTHER): Payer: Self-pay

## 2022-12-27 ENCOUNTER — Inpatient Hospital Stay (HOSPITAL_BASED_OUTPATIENT_CLINIC_OR_DEPARTMENT_OTHER)
Admission: EM | Admit: 2022-12-27 | Discharge: 2022-12-28 | DRG: 103 | Disposition: A | Discharge status: Home / Self Care | Payer: Medicaid Other | Attending: Neurology | Admitting: Neurology

## 2022-12-27 DIAGNOSIS — Z8249 Family history of ischemic heart disease and other diseases of the circulatory system: Secondary | ICD-10-CM | POA: Diagnosis not present

## 2022-12-27 DIAGNOSIS — J45909 Unspecified asthma, uncomplicated: Secondary | ICD-10-CM | POA: Diagnosis present

## 2022-12-27 DIAGNOSIS — R531 Weakness: Secondary | ICD-10-CM | POA: Diagnosis not present

## 2022-12-27 DIAGNOSIS — E785 Hyperlipidemia, unspecified: Secondary | ICD-10-CM | POA: Diagnosis present

## 2022-12-27 DIAGNOSIS — F419 Anxiety disorder, unspecified: Secondary | ICD-10-CM | POA: Diagnosis present

## 2022-12-27 DIAGNOSIS — Z823 Family history of stroke: Secondary | ICD-10-CM | POA: Diagnosis not present

## 2022-12-27 DIAGNOSIS — Z8673 Personal history of transient ischemic attack (TIA), and cerebral infarction without residual deficits: Secondary | ICD-10-CM | POA: Diagnosis not present

## 2022-12-27 DIAGNOSIS — Z9282 Status post administration of tPA (rtPA) in a different facility within the last 24 hours prior to admission to current facility: Secondary | ICD-10-CM | POA: Diagnosis not present

## 2022-12-27 DIAGNOSIS — R200 Anesthesia of skin: Secondary | ICD-10-CM | POA: Diagnosis not present

## 2022-12-27 DIAGNOSIS — Z87891 Personal history of nicotine dependence: Secondary | ICD-10-CM | POA: Diagnosis not present

## 2022-12-27 DIAGNOSIS — R2990 Unspecified symptoms and signs involving the nervous system: Principal | ICD-10-CM | POA: Diagnosis present

## 2022-12-27 DIAGNOSIS — F319 Bipolar disorder, unspecified: Secondary | ICD-10-CM | POA: Diagnosis present

## 2022-12-27 DIAGNOSIS — Z792 Long term (current) use of antibiotics: Secondary | ICD-10-CM | POA: Diagnosis not present

## 2022-12-27 DIAGNOSIS — G8194 Hemiplegia, unspecified affecting left nondominant side: Secondary | ICD-10-CM | POA: Diagnosis present

## 2022-12-27 DIAGNOSIS — G43109 Migraine with aura, not intractable, without status migrainosus: Secondary | ICD-10-CM | POA: Diagnosis not present

## 2022-12-27 DIAGNOSIS — I639 Cerebral infarction, unspecified: Secondary | ICD-10-CM | POA: Diagnosis not present

## 2022-12-27 DIAGNOSIS — G4700 Insomnia, unspecified: Secondary | ICD-10-CM | POA: Diagnosis present

## 2022-12-27 DIAGNOSIS — I6389 Other cerebral infarction: Secondary | ICD-10-CM | POA: Diagnosis not present

## 2022-12-27 DIAGNOSIS — Z79899 Other long term (current) drug therapy: Secondary | ICD-10-CM | POA: Diagnosis not present

## 2022-12-27 DIAGNOSIS — F449 Dissociative and conversion disorder, unspecified: Secondary | ICD-10-CM | POA: Diagnosis present

## 2022-12-27 DIAGNOSIS — Z681 Body mass index (BMI) 19 or less, adult: Secondary | ICD-10-CM | POA: Diagnosis not present

## 2022-12-27 DIAGNOSIS — D539 Nutritional anemia, unspecified: Secondary | ICD-10-CM | POA: Diagnosis present

## 2022-12-27 DIAGNOSIS — Z7982 Long term (current) use of aspirin: Secondary | ICD-10-CM | POA: Diagnosis not present

## 2022-12-27 DIAGNOSIS — I10 Essential (primary) hypertension: Secondary | ICD-10-CM | POA: Diagnosis present

## 2022-12-27 DIAGNOSIS — E871 Hypo-osmolality and hyponatremia: Secondary | ICD-10-CM | POA: Diagnosis present

## 2022-12-27 HISTORY — DX: Unspecified symptoms and signs involving the nervous system: R29.90

## 2022-12-27 LAB — CBC
HCT: 35.4 % — ABNORMAL LOW (ref 36.0–46.0)
Hemoglobin: 11.5 g/dL — ABNORMAL LOW (ref 12.0–15.0)
MCH: 29.9 pg (ref 26.0–34.0)
MCHC: 32.5 g/dL (ref 30.0–36.0)
MCV: 91.9 fL (ref 80.0–100.0)
Platelets: 286 10*3/uL (ref 150–400)
RBC: 3.85 MIL/uL — ABNORMAL LOW (ref 3.87–5.11)
RDW: 13.2 % (ref 11.5–15.5)
WBC: 7.2 10*3/uL (ref 4.0–10.5)
nRBC: 0 % (ref 0.0–0.2)

## 2022-12-27 LAB — DIFFERENTIAL
Abs Immature Granulocytes: 0.02 10*3/uL (ref 0.00–0.07)
Basophils Absolute: 0.1 10*3/uL (ref 0.0–0.1)
Basophils Relative: 1 %
Eosinophils Absolute: 0.1 10*3/uL (ref 0.0–0.5)
Eosinophils Relative: 1 %
Immature Granulocytes: 0 %
Lymphocytes Relative: 31 %
Lymphs Abs: 2.2 10*3/uL (ref 0.7–4.0)
Monocytes Absolute: 0.6 10*3/uL (ref 0.1–1.0)
Monocytes Relative: 8 %
Neutro Abs: 4.2 10*3/uL (ref 1.7–7.7)
Neutrophils Relative %: 59 %

## 2022-12-27 LAB — COMPREHENSIVE METABOLIC PANEL
ALT: 14 U/L (ref 0–44)
AST: 19 U/L (ref 15–41)
Albumin: 4.2 g/dL (ref 3.5–5.0)
Alkaline Phosphatase: 46 U/L (ref 38–126)
Anion gap: 9 (ref 5–15)
BUN: 20 mg/dL (ref 6–20)
CO2: 22 mmol/L (ref 22–32)
Calcium: 9.2 mg/dL (ref 8.9–10.3)
Chloride: 100 mmol/L (ref 98–111)
Creatinine, Ser: 1.03 mg/dL — ABNORMAL HIGH (ref 0.44–1.00)
GFR, Estimated: >60 mL/min (ref >60)
Glucose, Bld: 61 mg/dL — ABNORMAL LOW (ref 70–99)
Potassium: 4.4 mmol/L (ref 3.5–5.1)
Sodium: 131 mmol/L — ABNORMAL LOW (ref 135–145)
Total Bilirubin: 0.6 mg/dL (ref 0.3–1.2)
Total Protein: 7.1 g/dL (ref 6.5–8.1)

## 2022-12-27 LAB — ETHANOL: Alcohol, Ethyl (B): <10 mg/dL (ref <10)

## 2022-12-27 LAB — CBG MONITORING, ED: Glucose-Capillary: 94 mg/dL (ref 70–99)

## 2022-12-27 MED ORDER — IOHEXOL 350 MG/ML SOLN
75.0000 mL | Freq: Once | INTRAVENOUS | Status: AC | PRN
  Administered 2022-12-27: 75 mL via INTRAVENOUS

## 2022-12-27 MED ORDER — ACETAMINOPHEN 160 MG/5ML PO SOLN: 650.0000 mg | ORAL | Status: DC | PRN

## 2022-12-27 MED ORDER — SODIUM CHLORIDE 0.9 % IV SOLN: INTRAVENOUS | Status: DC

## 2022-12-27 MED ORDER — SODIUM CHLORIDE 0.9% FLUSH
3.0000 mL | Freq: Once | INTRAVENOUS | Status: AC
  Administered 2022-12-27: 3 mL via INTRAVENOUS
  Filled 2022-12-27: qty 3

## 2022-12-27 MED ORDER — ACETAMINOPHEN 325 MG PO TABS
650.0000 mg | ORAL_TABLET | ORAL | Status: DC | PRN
  Administered 2022-12-28: 650 mg via ORAL
  Filled 2022-12-27: qty 2

## 2022-12-27 MED ORDER — ACETAMINOPHEN 650 MG RE SUPP: 650.0000 mg | RECTAL | Status: DC | PRN

## 2022-12-27 MED ORDER — TENECTEPLASE FOR STROKE
PACK | INTRAVENOUS | Status: AC
  Administered 2022-12-27: 12 mg via INTRAVENOUS
  Filled 2022-12-27: qty 10

## 2022-12-27 MED ORDER — MELATONIN 3 MG PO TABS
3.0000 mg | ORAL_TABLET | Freq: Every day | ORAL | Status: DC
  Administered 2022-12-27: 3 mg via ORAL
  Filled 2022-12-27: qty 1

## 2022-12-27 MED ORDER — SENNOSIDES-DOCUSATE SODIUM 8.6-50 MG PO TABS: 1.0000 | ORAL_TABLET | Freq: Every evening | ORAL | Status: DC | PRN

## 2022-12-27 MED ORDER — STROKE: EARLY STAGES OF RECOVERY BOOK
Freq: Once | Status: DC
  Filled 2022-12-27 (×2): qty 1

## 2022-12-27 MED ORDER — PANTOPRAZOLE SODIUM 40 MG IV SOLR
40.0000 mg | Freq: Every day | INTRAVENOUS | Status: DC
  Administered 2022-12-27: 40 mg via INTRAVENOUS
  Filled 2022-12-27: qty 10

## 2022-12-27 MED ORDER — TENECTEPLASE FOR STROKE: 0.2500 mg/kg | PACK | Freq: Once | INTRAVENOUS | Status: AC

## 2022-12-27 NOTE — ED Notes (Signed)
Pt ambulatory to bathroom, standby assistance.  

## 2022-12-27 NOTE — ED Notes (Signed)
Attempted report x1, receiving RN unavailable.  

## 2022-12-27 NOTE — ED Triage Notes (Signed)
C/o numbness to left side of body starting 30 mins prior to arrival. Also c/o "twitching" to face. States she feels she is only speaking out of the right side of her mouth, left sided facial droop. Dr. Eloise Harman at bedside.   Speech and memory deficits from previous strokes.

## 2022-12-27 NOTE — ED Provider Notes (Addendum)
Misty Beck Provider Note   CSN: [id number] Arrival date & time: 12/27/22  1805     History  Chief Complaint  Patient presents with   Numbness    Misty Beck is a 52 y.o. female.  52 year old female with history of stroke and memory deficits who presents emergency department with left-sided weakness.  30 minutes prior to arrival the patient reports that she was driving and start experiencing left-sided numbness and weakness.  Is also having difficulty speaking.  Last stroke was reportedly in November of last year.  No residual weakness or numbness.  No history of brain bleeds.  No blood thinners.  No brain surgeries.  Had similar symptoms several years ago with left-sided weakness and numbness and was given tPA.  Never followed up with neurology afterwards.  MRI did not show any evidence of stroke.  Patient also reports she has had strokes before and not gone into the emergency department and they have resolved spontaneously.       Home Medications Prior to Admission medications   Medication Sig Start Date End Date Taking? Authorizing Provider  promethazine (PHENERGAN) 12.5 MG tablet Take 1 tablet (12.5 mg total) by mouth every 8 (eight) hours as needed for nausea or vomiting. 10/17/22   Worthy Rancher B, FNP  aspirin EC 325 MG tablet Take 1 tablet (325 mg total) by mouth daily. 06/12/22   Sandford Craze, NP  atorvastatin (LIPITOR) 10 MG tablet Take 1 tablet (10 mg total) by mouth daily. 04/18/22   Sandford Craze, NP  cyanocobalamin (VITAMIN B12) 1000 MCG/ML injection Inject 1 mL (1,000 mcg total) into the muscle once a week. 07/19/22   Sandford Craze, NP  diclofenac Sodium (VOLTAREN) 1 % GEL APPLY 2 GRAMS TOPICALLY 4 TIMES DAILY 04/16/22   Sandford Craze, NP  dicyclomine (BENTYL) 10 MG capsule Take 1 capsule (10 mg total) by mouth every 6 (six) hours as needed for spasms. 05/23/21   Cirigliano, Vito V, DO   diphenoxylate-atropine (LOMOTIL) 2.5-0.025 MG tablet Take 1 tablet by mouth 4 (four) times daily as needed for up to 30 doses for diarrhea or loose stools. 07/09/22   Sandford Craze, NP  doxycycline (VIBRAMYCIN) 100 MG capsule Take 1 capsule (100 mg total) by mouth 2 (two) times daily. 11/22/22   Horton, Kristie M, DO  Eluxadoline (VIBERZI) 100 MG TABS Take 1 tablet (100 mg total) by mouth 2 (two) times daily. 08/09/22   Meredith Pel, NP  gabapentin (NEURONTIN) 100 MG capsule Take 1 capsule (100 mg total) by mouth 3 (three) times daily. 04/16/22   Sandford Craze, NP  LORazepam (ATIVAN) 0.5 MG tablet Take 1 tablet (0.5 mg total) by mouth every 8 (eight) hours. 10/06/22   Sandford Craze, NP  methocarbamol (ROBAXIN) 500 MG tablet Take 1 tablet (500 mg total) by mouth every 8 (eight) hours as needed for muscle spasms. 12/06/21   Cammy Copa, MD  ondansetron (ZOFRAN-ODT) 4 MG disintegrating tablet Take 1 tablet (4 mg total) by mouth every 8 (eight) hours as needed for nausea or vomiting. 04/16/22   Sandford Craze, NP  sertraline (ZOLOFT) 100 MG tablet TAKE 1 TABLET BY MOUTH DAILY 12/18/22   Sandford Craze, NP  traMADol (ULTRAM) 50 MG tablet Take 1 tablet (50 mg total) by mouth every 12 (twelve) hours as needed. 08/09/22   Meredith Pel, NP  traZODone (DESYREL) 100 MG tablet Take 1 tablet (100 mg total) by mouth at bedtime as needed  for sleep. 10/17/22   Sandford Craze, NP      Allergies    Patient has no known allergies.    Review of Systems   Review of Systems  Physical Exam Updated Vital Signs BP 123/82   Pulse 76   Temp 97.7 F (36.5 C)   Resp 20   Wt 47.2 kg   SpO2 94%   BMI 17.87 kg/m  Physical Exam Vitals and nursing note reviewed.  Constitutional:      General: She is in acute distress.     Appearance: She is well-developed.     Comments: Anxious  HENT:     Head: Normocephalic and atraumatic.     Right Ear: External ear normal.     Left  Ear: External ear normal.     Nose: Nose normal.  Eyes:     Extraocular Movements: Extraocular movements intact.     Conjunctiva/sclera: Conjunctivae normal.     Pupils: Pupils are equal, round, and reactive to light.  Cardiovascular:     Rate and Rhythm: Normal rate and regular rhythm.     Heart sounds: No murmur heard. Pulmonary:     Effort: Pulmonary effort is normal. No respiratory distress.  Abdominal:     General: Abdomen is flat.     Palpations: Abdomen is soft.  Musculoskeletal:     Cervical back: Normal range of motion and neck supple.     Right lower leg: No edema.     Left lower leg: No edema.  Skin:    General: Skin is warm and dry.  Neurological:     Mental Status: She is alert.     Comments: NIHSS Exam  Level of Consciousness: Alert  LOC Questions: Answers Month and Age Correctly  LOC Commands: Opens and Closes Eyes and Hands on command  Best Gaze: Horizontal ocular movements intact  Visual Fields: No visual field loss  Facial Palsy: None  L Upper Extremity Motor: Drift present R Upper Extremity Motor: No drift after 10 seconds  L Lower extremity Motor: Drift present R Lower extremity Motor: No drift after 5 seconds  Ataxia: Absent  Sensory: Diminished sensation to light touch in left hemibody Best Language: No aphasia  Dysarthria: Minimal dysarthria Neglect: No visual or sensory neglect    Psychiatric:        Mood and Affect: Mood normal.     ED Results / Procedures / Treatments   Labs (all labs ordered are listed, but only abnormal results are displayed) Labs Reviewed  CBC - Abnormal; Notable for the following components:      Result Value   RBC 3.85 (*)    Hemoglobin 11.5 (*)    HCT 35.4 (*)    All other components within normal limits  COMPREHENSIVE METABOLIC PANEL - Abnormal; Notable for the following components:   Sodium 131 (*)    Glucose, Bld 61 (*)    Creatinine, Ser 1.03 (*)    All other components within normal limits  DIFFERENTIAL   ETHANOL  PROTIME-INR  APTT  CBG MONITORING, ED  CBG MONITORING, ED    EKG EKG Interpretation  Date/Time:  Thursday Dec 27 2022 18:49:09 EDT Ventricular Rate:  82 PR Interval:  175 QRS Duration: 93 QT Interval:  402 QTC Calculation: 470 R Axis:   85 Text Interpretation: Sinus rhythm RSR' in V1 or V2, right VCD or RVH Confirmed by Vonita Moss [phone]) on 12/27/2022 7:30:07 PM  Radiology CT ANGIO HEAD NECK W WO CM  Result  Date: 12/27/2022 CLINICAL DATA:  Left-sided facial droop with left upper and left lower extremity weakness. EXAM: CT ANGIOGRAPHY HEAD AND NECK WITH AND WITHOUT CONTRAST TECHNIQUE: Multidetector CT imaging of the head and neck was performed using the standard protocol during bolus administration of intravenous contrast. Multiplanar CT image reconstructions and MIPs were obtained to evaluate the vascular anatomy. Carotid stenosis measurements (when applicable) are obtained utilizing NASCET criteria, using the distal internal carotid diameter as the denominator. RADIATION DOSE REDUCTION: This exam was performed according to the departmental dose-optimization program which includes automated exposure control, adjustment of the mA and/or kV according to patient size and/or use of iterative reconstruction technique. CONTRAST:  75mL OMNIPAQUE IOHEXOL 350 MG/ML SOLN COMPARISON:  Head CT 12/27/2022. FINDINGS: CTA NECK FINDINGS Aortic arch: Three-vessel arch configuration. Arch vessel origins are patent. Right carotid system: No evidence of dissection, stenosis (50% or greater), or occlusion. Left carotid system: No evidence of dissection, stenosis (50% or greater), or occlusion. Vertebral arteries: Codominant. No evidence of dissection, stenosis (50% or greater), or occlusion. Skeleton: Unremarkable. Other neck: Unremarkable. Upper chest: Unremarkable. Review of the MIP images confirms the above findings CTA HEAD FINDINGS Anterior circulation: Intracranial ICAs are patent without  stenosis or aneurysm. The proximal ACAs and MCAs are patent without stenosis or aneurysm. Distal branches are symmetric. Posterior circulation: Normal basilar artery. The SCAs, AICAs and PICAs are patent proximally. The PCAs are patent proximally without stenosis or aneurysm. Distal branches are symmetric. Venous sinuses: As permitted by contrast timing, patent. Anatomic variants: None. Review of the MIP images confirms the above findings IMPRESSION: Normal CTA of the head and neck. Electronically Signed   By: Orvan Falconer M.D.   On: 12/27/2022 19:02   CT HEAD CODE STROKE WO CONTRAST  Result Date: 12/27/2022 CLINICAL DATA:  Code stroke. Neuro deficit, acute, stroke suspected. EXAM: CT HEAD WITHOUT CONTRAST TECHNIQUE: Contiguous axial images were obtained from the base of the skull through the vertex without intravenous contrast. RADIATION DOSE REDUCTION: This exam was performed according to the departmental dose-optimization program which includes automated exposure control, adjustment of the mA and/or kV according to patient size and/or use of iterative reconstruction technique. COMPARISON:  None Available. FINDINGS: Brain: No acute hemorrhage. Unchanged mild chronic small-vessel disease. Cortical gray-white differentiation is otherwise preserved. Prominence of the ventricles and sulci within expected range for age. No hydrocephalus or extra-axial collection. No mass effect or midline shift. Vascular: No hyperdense vessel or unexpected calcification. Skull: No calvarial fracture or suspicious bone lesion. Skull base is unremarkable. Sinuses/Orbits: Unremarkable. Other: None. ASPECTS [facility] Stroke Program Early CT Score) - Ganglionic level infarction (caudate, lentiform nuclei, internal capsule, insula, M1-M3 cortex): 7 - Supraganglionic infarction (M4-M6 cortex): 3 Total score (0-10 with 10 being normal): 10 IMPRESSION: 1. No acute hemorrhage or evidence of evolving large vessel territory infarction. 2.  ASPECT score is 10. Electronically Signed   By: Orvan Falconer M.D.   On: 12/27/2022 18:35    Procedures Procedures    Medications Ordered in ED Medications  sodium chloride flush (NS) 0.9 % injection 3 mL (3 mLs Intravenous Given 12/27/22 1855)  iohexol (OMNIPAQUE) 350 MG/ML injection 75 mL (75 mLs Intravenous Contrast Given 12/27/22 1843)  tenecteplase (TNKASE) injection for Stroke 12 mg (12 mg Intravenous Given 12/27/22 1902)    ED Course/ Medical Decision Making/ A&P Clinical Course as of 12/27/22 1931  Thu Dec 27, 2022  1923 Evaluated by Dr. Iver Nestle and TNK was administered.  Dr. Jerrell Belfast from neurology will admit to  the [facility] neuro ICU. [RP]    Clinical Course User Index [RP] Rondel Baton, MD                             Medical Decision Making Amount and/or Complexity of Data Reviewed Labs: ordered. Radiology: ordered.  Risk Prescription drug management. Decision regarding hospitalization.   Misty Beck is a 52 y.o. female with comorbidities that complicate the patient evaluation including stroke with residual cognitive deficits who presents to the emergency department with left-sided numbness and weakness  Initial Ddx:  Stroke, ICH, hypoglycemia, meningitis/infection  MDM:  Concerned about a stroke given the patient's symptoms.  Patient is protecting airway at this time.  Is within the window for tPA so we will activate code stroke.  Will check a CBG to ensure that the patient is not hypoglycemic and a CT of the head to evaluate for ICH.  Will also obtain CTA for vessel imaging.  No signs of infection currently on history or exam to suggest an infectious cause of their symptoms.  Plan:  CBG Labs Code stroke activation CT head CTA head and neck  ED Summary/Re-evaluation:  Patient was evaluated by neurology.  After her blood sugar was found to be 96 and CT of the head did not show any evidence of bleed decision was made by neurology to push TNK.  CTA of  the head did not reveal any evidence of LVO.  There were some thoughts that this may potentially not be due to an ischemic stroke since she has had the same recurrent symptoms multiple times which would be very atypical.  Patient was admitted to neurology for further management.  No complications from TNK while in the emergency department.    This patient presents to the ED for concern of complaints listed in HPI, this involves an extensive number of treatment options, and is a complaint that carries with it a high risk of complications and morbidity. Disposition including potential need for admission considered.   Dispo: ICU  Additional history obtained from spouse Records reviewed ED Visit Notes The following labs were independently interpreted: CBC and show no acute abnormality I independently reviewed the following imaging with scope of interpretation limited to determining acute life threatening conditions related to emergency care: CT Head and agree with the radiologist interpretation with the following exceptions: none I personally reviewed and interpreted cardiac monitoring: normal sinus rhythm  I personally reviewed and interpreted the pt's EKG: see above for interpretation  I have reviewed the patients home medications and made adjustments as needed Consults: Neurology        Final Clinical Impression(s) / ED Diagnoses Final diagnoses:  Stroke-like symptoms  Left sided numbness  Left-sided weakness    Rx / DC Orders ED Discharge Orders     None      CRITICAL CARE Performed by: Rondel Baton   Total critical care time: 45 minutes  Critical care time was exclusive of separately billable procedures and treating other patients.  Critical care was necessary to treat or prevent imminent or life-threatening deterioration.  Critical care was time spent personally by me on the following activities: development of treatment plan with patient and/or surrogate as well  as nursing, discussions with consultants, evaluation of patient's response to treatment, examination of patient, obtaining history from patient or surrogate, ordering and performing treatments and interventions, ordering and review of laboratory studies, ordering and review of radiographic  studies, pulse oximetry and re-evaluation of patient's condition.     Rondel Baton, MD 12/28/22 1255

## 2022-12-27 NOTE — ED Notes (Signed)
Pt remains in CT scan, neurology assessing patient after CT head without prior to obtaining CTA. Husband at bedside

## 2022-12-27 NOTE — ED Notes (Signed)
Neurologist off call, reviewing chart.  RN waiting to administer tnk until neurologist on tele call.

## 2022-12-27 NOTE — ED Notes (Signed)
Pt transported to CT ?

## 2022-12-27 NOTE — H&P (Signed)
Stroke Neurology Admission History & Physical   CC: Left-sided numbness weakness and facial twitching.  Status post IV thrombolysis via telestroke at a freestanding ER  History is obtained from: Patient, extensive chart review including Care Everywhere  HPI: Misty Beck is a 52 y.o. 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 Please refer to the excellent HPI by Dr. Iver Nestle and her telestroke operative note, but to summarize, she presented to the outside hospital with a last known well at 5:30 PM with sudden onset of left-sided symptoms that included weakness, numbness and facial twitching, for which a code stroke was activated, telestroke assessment was performed, within the constraints of the camera examination, there was questionable some inconsistencies in the exam and a prior history from outside hospitals was not readily available but discussion with the patient was done to obtain an MRI prior to making thrombolytic discussions but she refused transfer for MRI IV thrombolysis was administered at 1902 hrs. and patient was transferred for postthrombolytic care at [facility].  Reports improvement in her left-sided numbness and weakness symptoms but still has some heaviness and numbness as well as subjective sensation of left side not feeling normal in comparison to the right at the time of this encounter in the ICU. Reports a mild headache with mild photophobia at this time as well.  No nausea or vomiting.  No visual symptoms at this time    LKW: 5:30 PM IV  thrombolysis given?:  Yes-by teleneurology Premorbid modified Rankin scale (mRS): 0  ROS: Full ROS was performed and is negative except as noted in the HPI.   Past Medical History:  Diagnosis Date   Asthma    Depression with anxiety 04/20/2021   Referral source Dr Amada Kingfisher bonsu   Diarrhea    referral source   Hyperlipidemia    referral source   Insomnia 04/20/2021   referral source Dr Christin Fudge   Received intravenous tissue plasminogen activator (tPA) in emergency department 10/24/2021   Stroke [facility])    Dec, 2021, Jan 2022     Family History  Problem Relation Age of Onset   Stroke Mother    Cancer Mother    Colon polyps Mother    Skin cancer Mother    Breast cancer Mother 71   Kidney disease Father    Hyperlipidemia Father    COPD Father    Heart disease Father    Colon cancer Neg Hx    Rectal cancer Neg Hx    Stomach cancer Neg Hx      Social History:   reports that she has quit smoking. Her smoking use included cigarettes. She has never used smokeless tobacco. She reports that she does not currently use alcohol. She reports that she does not currently use drugs.  Medications  Current Facility-Administered Medications:    [START ON 12/28/2022]  stroke: early stages of recovery book, , Does not apply, Once, Milon Dikes, MD   0.9 %  sodium chloride infusion, , Intravenous, Continuous, Milon Dikes, MD   acetaminophen (TYLENOL) tablet 650 mg, 650 mg, Oral, Q4H PRN **OR** acetaminophen (TYLENOL) 160 MG/5ML solution 650 mg, 650 mg, Per Tube,  Q4H PRN **OR** acetaminophen (TYLENOL) suppository 650 mg, 650 mg, Rectal, Q4H PRN, Milon Dikes, MD   pantoprazole (PROTONIX) injection 40 mg, 40 mg, Intravenous, QHS, Milon Dikes, MD   senna-docusate (Senokot-S) tablet 1 tablet, 1 tablet, Oral, QHS PRN, Milon Dikes, MD  Current Outpatient Medications:    Melatonin 10 MG TABS, Take 10 mg by mouth at bedtime., Disp: , Rfl:    promethazine (PHENERGAN) 12.5 MG tablet,  Take 1 tablet (12.5 mg total) by mouth every 8 (eight) hours as needed for nausea or vomiting., Disp: 20 tablet, Rfl: 0   aspirin EC 325 MG tablet, Take 1 tablet (325 mg total) by mouth daily., Disp: , Rfl:    atorvastatin (LIPITOR) 10 MG tablet, Take 1 tablet (10 mg total) by mouth daily., Disp: 90 tablet, Rfl: 1   cyanocobalamin (VITAMIN B12) 1000 MCG/ML injection, Inject 1 mL (1,000 mcg total) into the muscle once a week., Disp: 10 mL, Rfl: 0   diclofenac Sodium (VOLTAREN) 1 % GEL, APPLY 2 GRAMS TOPICALLY 4 TIMES DAILY, Disp: 400 g, Rfl: 1   dicyclomine (BENTYL) 10 MG capsule, Take 1 capsule (10 mg total) by mouth every 6 (six) hours as needed for spasms., Disp: 60 capsule, Rfl: 3   diphenoxylate-atropine (LOMOTIL) 2.5-0.025 MG tablet, Take 1 tablet by mouth 4 (four) times daily as needed for up to 30 doses for diarrhea or loose stools., Disp: 30 tablet, Rfl: 0   doxycycline (VIBRAMYCIN) 100 MG capsule, Take 1 capsule (100 mg total) by mouth 2 (two) times daily., Disp: 20 capsule, Rfl: 0   Eluxadoline (VIBERZI) 100 MG TABS, Take 1 tablet (100 mg total) by mouth 2 (two) times daily., Disp: 180 tablet, Rfl: 0   gabapentin (NEURONTIN) 100 MG capsule, Take 1 capsule (100 mg total) by mouth 3 (three) times daily., Disp: 90 capsule, Rfl: 1   LORazepam (ATIVAN) 0.5 MG tablet, Take 1 tablet (0.5 mg total) by mouth every 8 (eight) hours., Disp: 30 tablet, Rfl: 0   methocarbamol (ROBAXIN) 500 MG tablet, Take 1 tablet (500 mg total) by mouth every 8 (eight) hours as needed for muscle spasms., Disp: 35 tablet, Rfl: 0   ondansetron (ZOFRAN-ODT) 4 MG disintegrating tablet, Take 1 tablet (4 mg total) by mouth every 8 (eight) hours as needed for nausea or vomiting., Disp: 30 tablet, Rfl: 0   sertraline (ZOLOFT) 100 MG tablet, TAKE 1 TABLET BY MOUTH DAILY, Disp: 90 tablet, Rfl: 0   traMADol (ULTRAM) 50 MG tablet, Take 1 tablet (50 mg total) by mouth every 12 (twelve) hours as needed., Disp: 30 tablet, Rfl: 0    traZODone (DESYREL) 100 MG tablet, Take 1 tablet (100 mg total) by mouth at bedtime as needed for sleep., Disp: 90 tablet, Rfl: 0   Exam: Current vital signs: BP 118/78   Pulse 72   Temp 97.7 F (36.5 C)   Resp 20   Wt 47.2 kg   SpO2 100%   BMI 17.87 kg/m  Vital signs in last 24 hours: Temp:  [97.7 F (36.5 C)] 97.7 F (36.5 C) (05/30 2130) Pulse Rate:  [64-83] 72 (05/30 2130) Resp:  [14-24] 20 (05/30 2130) BP: (118-138)/(73-90) 118/78 (05/30 2130) SpO2:  [94 %-100 %] 100 % (05/30 2130) Weight:  [47.2 kg] 47.2 kg (05/30 1817) General: Awake alert in no distress, cooperative with exam HEENT: Normocephalic atraumatic Lungs clear Cardiovascular regular rate rhythm Abdomen nondistended nontender Extremities warm well-perfused Neurological exam Awake alert oriented x 3 No evidence of aphasia or dysarthria  Cranial nerves II to XII intact Motor examination with no drift but it seemed like she had to put extra effort to keep her left leg up in comparison to the right against gravity. Sensation diminished on the left hemibody in comparison to the right Coordination exam reveals no dysmetria in upper or lower extremity. Gait testing was deferred NIH stroke scale-1 for subjective sensory  Labs I have reviewed labs in epic and the results pertinent to this consultation are: CBC    Component Value Date/Time   WBC 7.2 12/27/2022 1814   RBC 3.85 (L) 12/27/2022 1814   HGB 11.5 (L) 12/27/2022 1814   HCT 35.4 (L) 12/27/2022 1814   PLT 286 12/27/2022 1814   MCV 91.9 12/27/2022 1814   MCH 29.9 12/27/2022 1814   MCHC 32.5 12/27/2022 1814   RDW 13.2 12/27/2022 1814   LYMPHSABS 2.2 12/27/2022 1814   MONOABS 0.6 12/27/2022 1814   EOSABS 0.1 12/27/2022 1814   BASOSABS 0.1 12/27/2022 1814    CMP     Component Value Date/Time   NA 131 (L) 12/27/2022 1814   K 4.4 12/27/2022 1814   CL 100 12/27/2022 1814   CO2 22 12/27/2022 1814   GLUCOSE 61 (L) 12/27/2022 1814   BUN 20  12/27/2022 1814   CREATININE 1.03 (H) 12/27/2022 1814   CALCIUM 9.2 12/27/2022 1814   PROT 7.1 12/27/2022 1814   ALBUMIN 4.2 12/27/2022 1814   AST 19 12/27/2022 1814   ALT 14 12/27/2022 1814   ALKPHOS 46 12/27/2022 1814   BILITOT 0.6 12/27/2022 1814   GFRNONAA >60 12/27/2022 1814   Imaging I have reviewed the images obtained:  CT-head-no acute changes  Assessment: 52 year old presenting to the outside ER with sudden onset of left-sided symptoms and given IV TNKase for presumed strokelike symptoms. 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 Symptoms and exam revealing of either a mild stroke versus complex migraine versus functional neurological disorder She will require IV thrombolysis care for which she is admitted to the ICU.  Plan: Strokelike symptoms versus complex migraine status post IV thrombolysis  Acuity: Acute Current Suspected Etiology: Under investigation Continue Evaluation:  -Admit to: Neuro ICU -Hold Aspirin until 24 hour post IV thrombolysis (tPA or TNKase) neuroimaging is stable and without evidence of bleeding -Blood pressure control, goal of less than 180/105 -MRI/ECHO/A1C/Lipid panel. -Hyperglycemia management per SSI to maintain glucose 140-180mg /dL. -PT/OT/ST therapies and recommendations when able  CNS Hemiparesis on the left - PT OT speech therapy - Close neuromonitoring - Postthrombolytic vitals and neurochecks -Will need outpatient follow-up preferably with headache neurology   RESP No acute issues Monitor clinically  CV No active issues Blood pressure goal as above  Hyperlipidemia, unspecified  - Statin for goal LDL < 70   HEME Likely deficiency anemia -Monitor labs in the morning -Transfuse for hemoglobin  less than 7  ENDO Check A1c  Fluid/Electrolyte Disorders Mildly hyponatremic Gentle hydration with normal saline 75 cc an hour Check BMP in the morning   ID No active issues  Other: Requests medications for sleep.  Will order melatonin.  Will avoid sedating medications that may confound neurological exam post IV thrombolysis  Prophylaxis DVT: SCD GI: PPI Bowel: Docusate senna  Diet: NPO until cleared by speech/bedside swallow eval  Code Status: Full Code    THE FOLLOWING WERE  PRESENT ON ADMISSION: Coagulopathy due to IV thrombolysis at outside hospital.  Hemiparesis.  -- Milon Dikes, MD Neurologist [facility] Pager: [phone]

## 2022-12-27 NOTE — Progress Notes (Addendum)
Code Stroke activated @ 1811.  To CT @ 1815 with return @ 1842.  Dr. Iver Nestle saw the patient on camera @ 1817 in the CT dept.    Josiah Lobo BSN, Occupational hygienist

## 2022-12-27 NOTE — ED Notes (Signed)
Pt provided chicken soup, saltine crackers, ginger ale per her request.

## 2022-12-27 NOTE — Consult Note (Signed)
[facility] Telemedicine Consult   Requesting Provider: Vonita Moss Consult Participants: Myself, Husband, patient, bedside nurse Lars Pinks nurse Elnita Maxwell  Location of the provider: [facility]  Location of the patient: [facility]  This consult was provided via telemedicine with 2-way video and audio communication. The patient/family was informed that care would be provided in this way and agreed to receive care in this manner.   Chief Complaint: Left-sided numbness and weakness with facial twitching  HPI: [name] is a 52 year old woman with a past medical history of reported prior stroke s/p tPA/TNK x 2 per report with residual memory difficulty and mild speech difficulty, migraine with aura, hot flashes, chronic diarrhea complicated by weight loss, atypical chest pain, anxiety/depression/bipolar disorder, chronic left shoulder pain.  She has been in her usual state of health recently, was [place] on the highway when she began to have the symptoms, more severe than her last episode in October 2023.  She therefore called her husband and was brought to the ED for further evaluation where code stroke was activated.  She and her husband report that she had 2 other prior episodes which were treated with thrombolytic, and she was reluctant to present to due to not doing being hospitalized with close neurological monitoring after thrombolytic administration.  I was unable to personally verify this history in the emergent setting, however subsequently after records request was able to see she was treated at at an outlying hospital and transferred to [facility], and actually given a diagnosis of complex migraine aura versus stress-induced/conversion etiology on neurology evaluation there  Her last 2 ED presentations were 11/22/2022 for 2 weeks of cough, diagnosed with URI/bronchitis 08/01/2022 for weight loss in the setting of chronic diarrhea, requesting immediate CT chest  abdomen pelvis   LKW: 5:30 PM Thrombolytic given?: Yes 19:02  Checklist of contradindications was reviewed.  Risks, benefits and alternatives were discussed. Some delay due to patient requesting I also discussed with her husband prior to thrombolytic decision.  Subsequent delayed due to technological issue and cart disconnection We discussed specifically the alternative of emergent transfer to [facility] for MRI to rule out stroke prior to TNK, but this was declined by patient/husband We discussed that recurrent identical symptoms with facial twitching are highly unlikely to be stroke, but that I could not fully rule out the possibility of stroke via video evaluation without MRI IR Thrombectomy? No, exam not consistent with LVO Modified Rankin Scale: 1-No significant post stroke disability and can perform usual duties with stroke symptoms Time of teleneurologist evaluation: 6:17 PM  Exam: Current vital signs: BP 120/83   Pulse 65   Temp 97.7 F (36.5 C)   Resp 17   Wt 47.2 kg   SpO2 100%   BMI 17.87 kg/m  Vital signs in last 24 hours: Temp:  [97.7 F (36.5 C)] 97.7 F (36.5 C) (05/30 1914) Pulse Rate:  [65-81] 65 (05/30 1931) Resp:  [17-24] 17 (05/30 1931) BP: (120-138)/(82-87) 120/83 (05/30 1931) SpO2:  [94 %-100 %] 100 % (05/30 1931) Weight:  [47.2 kg] 47.2 kg (05/30 1817)   General: Anxious Pulmonary: breathing comfortably Cardiac: regular rate and rhythm on monitor   NIH Stroke scale 1A: Level of Consciousness - 0 1B: Ask Month and Age - 0 1C: 'Blink Eyes' & 'Squeeze Hands' - 0 2: Test Horizontal Extraocular Movements - 0 3: Test Visual Fields - 0 4: Test Facial Palsy - 1 (inconsistent and distractible) 5A: Test Left Arm Motor  Drift - 1 5B: Test Right Arm Motor Drift - 0 6A: Test Left Leg Motor Drift - 2 6B: Test Right Leg Motor Drift - 0 7: Test Limb Ataxia - 0 8: Test Sensation - 1 9: Test Language/Aphasia- 0 10: Test Dysarthria - 1 11: Test  Extinction/Inattention - 0 NIHSS score: 6   Imaging Reviewed:   Head CT personally reviewed, agree with radiology no acute intracranial process  MRI brain 07/16/2020 with chronic microvascular disease versus migraine related changes MRI brain 08/01/2020 with chronic microvascular disease versus migraine related changes  ECHO  1. Left ventricular ejection fraction, by estimation, is 60 to 65%. The left ventricle has normal function. The left ventricle has no regional wall motion abnormalities. Left ventricular diastolic parameters were normal. 2. Right ventricular systolic function is normal. The right ventricular size is normal. 3. The mitral valve is normal in structure. No evidence of mitral valve regurgitation. No evidence of mitral stenosis. 4. The aortic valve is normal in structure. Aortic valve regurgitation is not visualized. No aortic stenosis is present. 5. The inferior vena cava is normal in size with greater than 50% respiratory variability, suggesting right atrial pressure of 3 mmHg.   Labs reviewed in epic and pertinent values follow:  Basic Metabolic Panel: Recent Labs  Lab 12/27/22 1814  NA 131*  K 4.4  CL 100  CO2 22  GLUCOSE 61*  BUN 20  CREATININE 1.03*  CALCIUM 9.2    CBC: Recent Labs  Lab 12/27/22 1814  WBC 7.2  NEUTROABS 4.2  HGB 11.5*  HCT 35.4*  MCV 91.9  PLT 286    Coagulation Studies: No results for input(s): "LABPROT", "INR" in the last 72 hours.     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  Focal seizure is another possibility given the facial twitching, but no clear seizure focus on prior MRIs and overall this twitching seems arrhythmic and intermittent, more functional by appearance  Recommendations:   # Stroke-like episode,  functional neurologic left-sided weakness - Stroke labs HgbA1c, fasting lipid panel - MRI brain 24 hours post TNK - Frequent neuro checks - Hold antiplatelets and subQ heparin for 24 hours until post TNK head imaging completed  - SCDs - Risk factor modification - Telemetry monitoring - Blood pressure goal   - Post TNK for 24  hours < 180/105 - PT consult, OT consult, Speech consult - Will be admitted at [facility] to stroke service  This patient is receiving care for possible acute neurological changes. There was 40 minutes of care by this provider at the time of service, including time for direct evaluation via telemedicine, review of medical records, imaging studies and discussion of findings with providers, the patient and/or family.   Brooke Dare MD-PhD [facility] [phone]   If 8pm-8am, please page neurology on call as listed in AMION.  CRITICAL CARE Performed by: Gordy Councilman   Total critical care time: 40 minutes  Critical care time was exclusive of separately billable procedures and treating other patients.  Critical care was necessary to treat or prevent imminent or life-threatening deterioration.  Critical care was time spent personally by me on the following activities: development of treatment plan with patient and/or surrogate as well as nursing, discussions with consultants, evaluation of patient's response to treatment, examination of patient, obtaining  history from patient or surrogate, ordering and performing treatments and interventions, ordering and review of laboratory studies, ordering and review of radiographic studies, pulse oximetry and re-evaluation of patient's condition.  With contrast

## 2022-12-28 ENCOUNTER — Inpatient Hospital Stay (HOSPITAL_COMMUNITY): Payer: Medicaid Other

## 2022-12-28 DIAGNOSIS — G43109 Migraine with aura, not intractable, without status migrainosus: Principal | ICD-10-CM

## 2022-12-28 DIAGNOSIS — I6389 Other cerebral infarction: Secondary | ICD-10-CM | POA: Diagnosis not present

## 2022-12-28 DIAGNOSIS — R2990 Unspecified symptoms and signs involving the nervous system: Secondary | ICD-10-CM | POA: Diagnosis not present

## 2022-12-28 LAB — RAPID URINE DRUG SCREEN, HOSP PERFORMED
Amphetamines: NOT DETECTED
Barbiturates: POSITIVE — AB
Benzodiazepines: NOT DETECTED
Cocaine: NOT DETECTED
Opiates: NOT DETECTED
Tetrahydrocannabinol: POSITIVE — AB

## 2022-12-28 LAB — ECHOCARDIOGRAM COMPLETE
AR max vel: 2.71 cm2
AV Area VTI: 2.69 cm2
AV Area mean vel: 2.63 cm2
AV Mean grad: 2 mmHg
AV Peak grad: 3.8 mmHg
Ao pk vel: 0.98 m/s
Area-P 1/2: 4.12 cm2
S' Lateral: 2.6 cm
Weight: 1601.42 oz

## 2022-12-28 LAB — LIPID PANEL
Cholesterol: 199 mg/dL (ref 0–200)
HDL: 66 mg/dL (ref >40)
LDL Cholesterol: 118 mg/dL — ABNORMAL HIGH (ref 0–99)
Total CHOL/HDL Ratio: 3 RATIO
Triglycerides: 73 mg/dL (ref <150)
VLDL: 15 mg/dL (ref 0–40)

## 2022-12-28 LAB — GLUCOSE, CAPILLARY: Glucose-Capillary: 104 mg/dL — ABNORMAL HIGH (ref 70–99)

## 2022-12-28 LAB — MRSA NEXT GEN BY PCR, NASAL: MRSA by PCR Next Gen: NOT DETECTED

## 2022-12-28 LAB — HIV ANTIBODY (ROUTINE TESTING W REFLEX): HIV Screen 4th Generation wRfx: NONREACTIVE

## 2022-12-28 MED ORDER — CYANOCOBALAMIN 1000 MCG/ML IJ SOLN
1000.0000 ug | INTRAMUSCULAR | Status: DC
  Administered 2022-12-28: 1000 ug via INTRAMUSCULAR
  Filled 2022-12-28: qty 1

## 2022-12-28 MED ORDER — ATORVASTATIN CALCIUM 10 MG PO TABS: 10.0000 mg | ORAL_TABLET | Freq: Every day | ORAL | Status: DC

## 2022-12-28 MED ORDER — DIPHENOXYLATE-ATROPINE 2.5-0.025 MG PO TABS: 1.0000 | ORAL_TABLET | Freq: Four times a day (QID) | ORAL | Status: DC | PRN

## 2022-12-28 MED ORDER — SERTRALINE HCL 50 MG PO TABS
100.0000 mg | ORAL_TABLET | Freq: Every day | ORAL | Status: DC
  Administered 2022-12-28: 100 mg via ORAL
  Filled 2022-12-28: qty 2

## 2022-12-28 MED ORDER — GABAPENTIN 100 MG PO CAPS
100.0000 mg | ORAL_CAPSULE | Freq: Three times a day (TID) | ORAL | Status: DC
  Filled 2022-12-28: qty 1

## 2022-12-28 MED ORDER — ORAL CARE MOUTH RINSE: 15.0000 mL | OROMUCOSAL | Status: DC | PRN

## 2022-12-28 MED ORDER — ATORVASTATIN CALCIUM 40 MG PO TABS
40.0000 mg | ORAL_TABLET | Freq: Every day | ORAL | Status: DC
  Administered 2022-12-28: 40 mg via ORAL
  Filled 2022-12-28: qty 1

## 2022-12-28 MED ORDER — PERFLUTREN LIPID MICROSPHERE
1.0000 mL | INTRAVENOUS | Status: AC | PRN
  Administered 2022-12-28: 2 mL via INTRAVENOUS

## 2022-12-28 MED ORDER — PANTOPRAZOLE SODIUM 40 MG PO TBEC: 40.0000 mg | DELAYED_RELEASE_TABLET | Freq: Every day | ORAL | Status: DC

## 2022-12-28 MED ORDER — BUTALBITAL-APAP-CAFFEINE 50-325-40 MG PO TABS
1.0000 | ORAL_TABLET | ORAL | Status: DC | PRN
  Administered 2022-12-28 (×2): 1 via ORAL
  Filled 2022-12-28 (×2): qty 1

## 2022-12-28 MED ORDER — ATORVASTATIN CALCIUM 40 MG PO TABS: 40.0000 mg | ORAL_TABLET | Freq: Every day | ORAL | 2 refills | Status: DC

## 2022-12-28 NOTE — Evaluation (Signed)
Physical Therapy Evaluation Patient Details Name: Misty Beck MRN: [id number] DOB: [birth year] Today's Date: 12/28/2022  History of Present Illness  52 yo female presents to [facility] on 5/30 with L-sided numbness, weakness, facial twitching. TNK administered 1902 5/30. PMH includes strokelike symptoms (migraine vs conversion), , anxiety, depression, hyperlipidemia, insomnia.  Clinical Impression   Pt presents with mild LLE altered sensation per pt report, and increased time/effort to mobilize. Otherwise, pt is at baseline level of mobility, demonstrating symmetric LE strength, balance. Pt to benefit from acute PT to address deficits. Pt ambulated hallway distance at mod I level, requires increased time pt reporting due to being in bed for a day. Pt with no further acute or post-acute PT needs, pt's husband at bedside and supportive.        Recommendations for follow up therapy are one component of a multi-disciplinary discharge planning process, led by the attending physician.  Recommendations may be updated based on patient status, additional functional criteria and insurance authorization.  Follow Up Recommendations       Assistance Recommended at Discharge None  Patient can return home with the following       Equipment Recommendations None recommended by PT  Recommendations for Other Services       Functional Status Assessment Patient has not had a recent decline in their functional status     Precautions / Restrictions Precautions Precautions: None Restrictions Weight Bearing Restrictions: No      Mobility  Bed Mobility Overal bed mobility: Modified Independent                  Transfers Overall transfer level: Modified independent                      Ambulation/Gait Ambulation/Gait assistance: Modified independent (Device/Increase time) Gait Distance (Feet): 150 Feet Assistive device: None Gait Pattern/deviations: Step-through pattern, Decreased  stride length       General Gait Details: slowed out of pt caution, otherwise [facility]  Stairs            Wheelchair Mobility    Modified Rankin (Stroke Patients Only) Modified Rankin (Stroke Patients Only) Pre-Morbid Rankin Score: No symptoms Modified Rankin: No significant disability     Balance Overall balance assessment: Modified Independent                             High Level Balance Comments: WFL: head turns, 90 degree direction change, step over object, weave around objects. mild impairment (unsteady, slowed): 180 degree turn and stop             Pertinent Vitals/Pain Pain Assessment Pain Assessment: Faces Faces Pain Scale: No hurt Pain Intervention(s): Monitored during session    Home Living Family/patient expects to be discharged to:: Private residence Living Arrangements: Spouse/significant other Available Help at Discharge: Family Type of Home: House Home Access: Stairs to enter   Secretary/administrator of Steps: 2   Home Layout: One level Home Equipment: Shower seat      Prior Function Prior Level of Function : Independent/Modified Independent                     Hand Dominance   Dominant Hand: Right    Extremity/Trunk Assessment   Upper Extremity Assessment Upper Extremity Assessment: Defer to OT evaluation    Lower Extremity Assessment Lower Extremity Assessment: Overall WFL for tasks assessed;LLE deficits/detail LLE Sensation: decreased light  touch    Cervical / Trunk Assessment Cervical / Trunk Assessment: Normal  Communication   Communication: No difficulties  Cognition Arousal/Alertness: Awake/alert Behavior During Therapy: WFL for tasks assessed/performed Overall Cognitive Status: Within Functional Limits for tasks assessed                                          General Comments      Exercises     Assessment/Plan    PT Assessment Patient does not need any further PT services   PT Problem List  (n/a)       PT Treatment Interventions  (n/a)    PT Goals (Current goals can be found in the Care Plan section)  Acute Rehab PT Goals PT Goal Formulation: With patient Time For Goal Achievement: 12/28/22 Potential to Achieve Goals: Good    Frequency  (n/a)     Co-evaluation               AM-PAC PT "6 Clicks" Mobility  Outcome Measure Help needed turning from your back to your side while in a flat bed without using bedrails?: None Help needed moving from lying on your back to sitting on the side of a flat bed without using bedrails?: None Help needed moving to and from a bed to a chair (including a wheelchair)?: None Help needed standing up from a chair using your arms (e.g., wheelchair or bedside chair)?: None Help needed to walk in hospital room?: None Help needed climbing 3-5 steps with a railing? : A Little 6 Click Score: 23    End of Session Equipment Utilized During Treatment: Gait belt Activity Tolerance: Patient tolerated treatment well Patient left: in bed;with call bell/phone within reach;Other (comment);with family/visitor present (OT at bedside) Nurse Communication: Mobility status PT Visit Diagnosis: Other abnormalities of gait and mobility (R26.89)    Time: 1212-1222 PT Time Calculation (min) (ACUTE ONLY): 10 min   Charges:   PT Evaluation $PT Eval Low Complexity: 1 Low          Kairon Shock S, PT DPT Acute Rehabilitation Services Secure Chat Preferred  Office [phone]   Lailah Marcelli E Christain [place] 12/28/2022, 1:40 PM

## 2022-12-28 NOTE — Discharge Summary (Signed)
Stroke Discharge Summary  Patient ID: Misty Beck   MRN: [id number]      DOB: Nov 30, 1970  Date of Admission: 12/27/2022 Date of Discharge: 12/28/2022  Attending Physician:  Stroke, Md, MD, Stroke MD Consultant(s):    None  Patient's PCP:  Sandford Craze, NP  DISCHARGE DIAGNOSIS:  Complicated migraine  Active Problems: Anxiety Insomnia Hypertension Hyperlipidemia History of strokelike symptoms status post tPA/TNK   Allergies as of 12/28/2022       Reactions   Doxycycline Other (See Comments)   Burning skin         Medication List     STOP taking these medications    budesonide 3 MG 24 hr capsule Commonly known as: ENTOCORT EC   diphenoxylate-atropine 2.5-0.025 MG tablet Commonly known as: Lomotil   doxycycline 100 MG capsule Commonly known as: VIBRAMYCIN   gabapentin 100 MG capsule Commonly known as: NEURONTIN       TAKE these medications    aspirin EC 325 MG tablet Take 1 tablet (325 mg total) by mouth daily.   aspirin-acetaminophen-caffeine 250-250-65 MG tablet Commonly known as: EXCEDRIN MIGRAINE Take 2 tablets by mouth 3 (three) times daily as needed for headache or migraine.   atorvastatin 40 MG tablet Commonly known as: LIPITOR Take 1 tablet (40 mg total) by mouth daily. Start taking on: December 29, 2022 What changed:  medication strength how much to take   VITAMIN B-12 PO Take 1 capsule by mouth 3 (three) times a week.   cyanocobalamin 1000 MCG/ML injection Commonly known as: VITAMIN B12 Inject 1 mL (1,000 mcg total) into the muscle once a week.   diclofenac Sodium 1 % Gel Commonly known as: VOLTAREN APPLY 2 GRAMS TOPICALLY 4 TIMES DAILY What changed:  how much to take how to take this when to take this reasons to take this additional instructions   LORazepam 0.5 MG tablet Commonly known as: ATIVAN Take 1 tablet (0.5 mg total) by mouth every 8 (eight) hours. What changed:  when to take this reasons to take this    Melatonin 10 MG Tabs Take 10 mg by mouth at bedtime as needed (sleep).   methocarbamol 500 MG tablet Commonly known as: ROBAXIN Take 1 tablet (500 mg total) by mouth every 8 (eight) hours as needed for muscle spasms. What changed: when to take this   ondansetron 4 MG disintegrating tablet Commonly known as: ZOFRAN-ODT Take 1 tablet (4 mg total) by mouth every 8 (eight) hours as needed for nausea or vomiting. What changed: when to take this   promethazine 12.5 MG tablet Commonly known as: PHENERGAN Take 1 tablet (12.5 mg total) by mouth every 8 (eight) hours as needed for nausea or vomiting. What changed: when to take this   sertraline 100 MG tablet Commonly known as: ZOLOFT TAKE 1 TABLET BY MOUTH DAILY What changed: how much to take   traZODone 100 MG tablet Commonly known as: DESYREL Take 1 tablet (100 mg total) by mouth at bedtime as needed for sleep. What changed: when to take this   Viberzi 100 MG Tabs Generic drug: Eluxadoline Take 1 tablet (100 mg total) by mouth 2 (two) times daily.        LABORATORY STUDIES CBC    Component Value Date/Time   WBC 7.2 12/27/2022 1814   RBC 3.85 (L) 12/27/2022 1814   HGB 11.5 (L) 12/27/2022 1814   HCT 35.4 (L) 12/27/2022 1814   PLT 286 12/27/2022 1814   MCV  91.9 12/27/2022 1814   MCH 29.9 12/27/2022 1814   MCHC 32.5 12/27/2022 1814   RDW 13.2 12/27/2022 1814   LYMPHSABS 2.2 12/27/2022 1814   MONOABS 0.6 12/27/2022 1814   EOSABS 0.1 12/27/2022 1814   BASOSABS 0.1 12/27/2022 1814   CMP    Component Value Date/Time   NA 131 (L) 12/27/2022 1814   K 4.4 12/27/2022 1814   CL 100 12/27/2022 1814   CO2 22 12/27/2022 1814   GLUCOSE 61 (L) 12/27/2022 1814   BUN 20 12/27/2022 1814   CREATININE 1.03 (H) 12/27/2022 1814   CALCIUM 9.2 12/27/2022 1814   PROT 7.1 12/27/2022 1814   ALBUMIN 4.2 12/27/2022 1814   AST 19 12/27/2022 1814   ALT 14 12/27/2022 1814   ALKPHOS 46 12/27/2022 1814   BILITOT 0.6 12/27/2022 1814    GFRNONAA >60 12/27/2022 1814   COAGSNo results found for: "INR", "PROTIME" Lipid Panel    Component Value Date/Time   CHOL 199 12/28/2022 0108   TRIG 73 12/28/2022 0108   HDL 66 12/28/2022 0108   CHOLHDL 3.0 12/28/2022 0108   VLDL 15 12/28/2022 0108   LDLCALC 118 (H) 12/28/2022 0108   HgbA1C  Lab Results  Component Value Date   HGBA1C 5.9 06/12/2022   Urinalysis No results found for: "COLORURINE", "APPEARANCEUR", "LABSPEC", "PHURINE", "GLUCOSEU", "HGBUR", "BILIRUBINUR", "KETONESUR", "PROTEINUR", "UROBILINOGEN", "NITRITE", "LEUKOCYTESUR" Urine Drug Screen No results found for: "LABOPIA", "COCAINSCRNUR", "LABBENZ", "AMPHETMU", "THCU", "LABBARB"  Alcohol Level    Component Value Date/Time   ETH <10 12/27/2022 1814     SIGNIFICANT DIAGNOSTIC STUDIES ECHOCARDIOGRAM COMPLETE  Result Date: 12/28/2022    ECHOCARDIOGRAM REPORT   Patient Name:   MIANNA Beck Date of Exam: 12/28/2022 Medical Rec #:  [id number]      Height:       64.0 in Accession #:    [id number]     Weight:       100.1 lb Date of Birth:  [birth year]       BSA:          1.458 m Patient Age:    52 years       BP:           111/80 mmHg Patient Gender: F              HR:           71 bpm. Exam Location:  Inpatient Procedure: 2D Echo, Cardiac Doppler, Color Doppler and Intracardiac            Opacification Agent Indications:    Stroke I63.9  History:        Patient has prior history of Echocardiogram examinations, most                 recent 06/27/2022. Stroke; Risk Factors:Former Smoker.  Sonographer:    Dondra Prader RVT RCS Referring Phys: [id number] ASHISH ARORA IMPRESSIONS  1. Left ventricular ejection fraction, by estimation, is 55 to 60%. The left ventricle has normal function. The left ventricle has no regional wall motion abnormalities. Left ventricular diastolic parameters were normal.  2. Right ventricular systolic function is normal. The right ventricular size is normal. There is normal pulmonary artery systolic pressure.  3.  The mitral valve is normal in structure. Trivial mitral valve regurgitation. No evidence of mitral stenosis. There is mild prolapse of of the mitral valve.  4. The aortic valve is tricuspid. Aortic valve regurgitation is not visualized. No aortic stenosis is present.  5. The  inferior vena cava is normal in size with greater than 50% respiratory variability, suggesting right atrial pressure of 3 mmHg. FINDINGS  Left Ventricle: Left ventricular ejection fraction, by estimation, is 55 to 60%. The left ventricle has normal function. The left ventricle has no regional wall motion abnormalities. The left ventricular internal cavity size was normal in size. There is  no left ventricular hypertrophy. Left ventricular diastolic parameters were normal. Normal left ventricular filling pressure. Right Ventricle: The right ventricular size is normal. No increase in right ventricular wall thickness. Right ventricular systolic function is normal. There is normal pulmonary artery systolic pressure. The tricuspid regurgitant velocity is 2.05 m/s, and  with an assumed right atrial pressure of 3 mmHg, the estimated right ventricular systolic pressure is 19.8 mmHg. Left Atrium: Left atrial size was normal in size. Right Atrium: Right atrial size was normal in size. Pericardium: There is no evidence of pericardial effusion. Mitral Valve: The mitral valve is normal in structure. There is mild prolapse of of the mitral valve. Trivial mitral valve regurgitation. No evidence of mitral valve stenosis. Tricuspid Valve: The tricuspid valve is normal in structure. Tricuspid valve regurgitation is trivial. No evidence of tricuspid stenosis. Aortic Valve: The aortic valve is tricuspid. Aortic valve regurgitation is not visualized. No aortic stenosis is present. Aortic valve mean gradient measures 2.0 mmHg. Aortic valve peak gradient measures 3.8 mmHg. Aortic valve area, by VTI measures 2.69 cm. Pulmonic Valve: The pulmonic valve was normal in  structure. Pulmonic valve regurgitation is mild. No evidence of pulmonic stenosis. Aorta: The aortic root is normal in size and structure. Venous: The inferior vena cava is normal in size with greater than 50% respiratory variability, suggesting right atrial pressure of 3 mmHg. IAS/Shunts: No atrial level shunt detected by color flow Doppler.  LEFT VENTRICLE PLAX 2D LVIDd:         3.70 cm   Diastology LVIDs:         2.60 cm   LV e' medial:    9.14 cm/s LV PW:         1.85 cm   LV E/e' medial:  8.9 LV IVS:        0.60 cm   LV e' lateral:   10.80 cm/s LVOT diam:     1.90 cm   LV E/e' lateral: 7.6 LV SV:         52 LV SV Index:   35 LVOT Area:     2.84 cm  RIGHT VENTRICLE             IVC RV Basal diam:  2.80 cm     IVC diam: 1.40 cm RV S prime:     14.70 cm/s TAPSE (M-mode): 2.2 cm LEFT ATRIUM           Index        RIGHT ATRIUM          Index LA diam:      2.40 cm 1.65 cm/m   RA Area:     9.98 cm LA Vol (A4C): 29.5 ml 20.24 ml/m  RA Volume:   23.05 ml 15.81 ml/m  AORTIC VALVE                    PULMONIC VALVE AV Area (Vmax):    2.71 cm     PV Vmax:          0.63 m/s AV Area (Vmean):   2.63 cm     PV Peak grad:  1.6 mmHg AV Area (VTI):     2.69 cm     PR End Diast Vel: 3.72 msec AV Vmax:           98.10 cm/s AV Vmean:          63.900 cm/s AV VTI:            0.192 m AV Peak Grad:      3.8 mmHg AV Mean Grad:      2.0 mmHg LVOT Vmax:         93.70 cm/s LVOT Vmean:        59.200 cm/s LVOT VTI:          0.182 m LVOT/AV VTI ratio: 0.95  AORTA Ao Root diam: 2.70 cm Ao Asc diam:  2.70 cm MITRAL VALVE               TRICUSPID VALVE MV Area (PHT): 4.12 cm    TR Peak grad:   16.8 mmHg MV Decel Time: 184 msec    TR Vmax:        205.00 cm/s MV E velocity: 81.60 cm/s MV A velocity: 54.40 cm/s  SHUNTS MV E/A ratio:  1.50        Systemic VTI:  0.18 m                            Systemic Diam: 1.90 cm Chilton Si MD Electronically signed by Chilton Si MD Signature Date/Time: 12/28/2022/2:29:07 PM    Final    CT  ANGIO HEAD NECK W WO CM  Result Date: 12/27/2022 CLINICAL DATA:  Left-sided facial droop with left upper and left lower extremity weakness. EXAM: CT ANGIOGRAPHY HEAD AND NECK WITH AND WITHOUT CONTRAST TECHNIQUE: Multidetector CT imaging of the head and neck was performed using the standard protocol during bolus administration of intravenous contrast. Multiplanar CT image reconstructions and MIPs were obtained to evaluate the vascular anatomy. Carotid stenosis measurements (when applicable) are obtained utilizing NASCET criteria, using the distal internal carotid diameter as the denominator. RADIATION DOSE REDUCTION: This exam was performed according to the departmental dose-optimization program which includes automated exposure control, adjustment of the mA and/or kV according to patient size and/or use of iterative reconstruction technique. CONTRAST:  75mL OMNIPAQUE IOHEXOL 350 MG/ML SOLN COMPARISON:  Head CT 12/27/2022. FINDINGS: CTA NECK FINDINGS Aortic arch: Three-vessel arch configuration. Arch vessel origins are patent. Right carotid system: No evidence of dissection, stenosis (50% or greater), or occlusion. Left carotid system: No evidence of dissection, stenosis (50% or greater), or occlusion. Vertebral arteries: Codominant. No evidence of dissection, stenosis (50% or greater), or occlusion. Skeleton: Unremarkable. Other neck: Unremarkable. Upper chest: Unremarkable. Review of the MIP images confirms the above findings CTA HEAD FINDINGS Anterior circulation: Intracranial ICAs are patent without stenosis or aneurysm. The proximal ACAs and MCAs are patent without stenosis or aneurysm. Distal branches are symmetric. Posterior circulation: Normal basilar artery. The SCAs, AICAs and PICAs are patent proximally. The PCAs are patent proximally without stenosis or aneurysm. Distal branches are symmetric. Venous sinuses: As permitted by contrast timing, patent. Anatomic variants: None. Review of the MIP images  confirms the above findings IMPRESSION: Normal CTA of the head and neck. Electronically Signed   By: Orvan Falconer M.D.   On: 12/27/2022 19:02   CT HEAD CODE STROKE WO CONTRAST  Result Date: 12/27/2022 CLINICAL DATA:  Code stroke. Neuro deficit, acute, stroke suspected. EXAM: CT HEAD WITHOUT CONTRAST TECHNIQUE: Contiguous  axial images were obtained from the base of the skull through the vertex without intravenous contrast. RADIATION DOSE REDUCTION: This exam was performed according to the departmental dose-optimization program which includes automated exposure control, adjustment of the mA and/or kV according to patient size and/or use of iterative reconstruction technique. COMPARISON:  None Available. FINDINGS: Brain: No acute hemorrhage. Unchanged mild chronic small-vessel disease. Cortical gray-white differentiation is otherwise preserved. Prominence of the ventricles and sulci within expected range for age. No hydrocephalus or extra-axial collection. No mass effect or midline shift. Vascular: No hyperdense vessel or unexpected calcification. Skull: No calvarial fracture or suspicious bone lesion. Skull base is unremarkable. Sinuses/Orbits: Unremarkable. Other: None. ASPECTS [facility] Stroke Program Early CT Score) - Ganglionic level infarction (caudate, lentiform nuclei, internal capsule, insula, M1-M3 cortex): 7 - Supraganglionic infarction (M4-M6 cortex): 3 Total score (0-10 with 10 being normal): 10 IMPRESSION: 1. No acute hemorrhage or evidence of evolving large vessel territory infarction. 2. ASPECT score is 10. Electronically Signed   By: Orvan Falconer M.D.   On: 12/27/2022 18:35      HISTORY OF PRESENT ILLNESS CHRISTYE VIERLING is a 52 y.o. 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 Please refer to the excellent HPI by Dr. Iver Nestle and her telestroke operative note, but to summarize, she presented to the outside hospital with a last known well at 5:30 PM with sudden onset of left-sided symptoms that included weakness, numbness and facial twitching, for which a code stroke was activated, telestroke assessment was performed, within the constraints of the camera examination, there was questionable some inconsistencies in the exam and a prior history from outside hospitals was not readily available but discussion with the patient was done to obtain an MRI prior to making thrombolytic discussions but she refused transfer for MRI IV thrombolysis was administered at 1902 hrs. and patient was transferred for postthrombolytic care at [facility].  Reports improvement in her left-sided numbness and weakness symptoms but still has some heaviness and numbness as well as subjective sensation of left side not feeling normal in comparison to the right at the time of this encounter in the ICU. Reports a mild headache with mild photophobia at this time as well.  No nausea or vomiting.  No visual symptoms at this time      LKW: 5:30 PM IV thrombolysis given?:  Yes-by teleneurology Premorbid modified Rankin scale (mRS): 0   HOSPITAL COURSE Ms. RANDIE SOKOLOW is a 52 y.o. female with history of anxiety, depression, hypertension, hyperlipidemia, insomnia, migraine admitted for left-sided numbness and weakness, headache, left facial twitching.  TNK given.    Likely complicated migraine Patient had a headache all day long yesterday, started have above symptoms around 5:30 PM.  Currently still have headache, but neuro symptoms has resolved MRI negative for stroke CTA head and neck  unremarkable 2D Echo EF 55 to 60% LDL 118 HgbA1c pending SCDs for VTE prophylaxis aspirin 325 mg daily prior to admission, now on aspirin 325 mg daily.  Continue home aspirin on discharge Ongoing aggressive stroke risk factor management Therapy recommendations: None Disposition: Home today  History of migraine Patient stated that  she had headache every day Taking Excedrin nearly twice a day for headache Has not seen neurology in the past Will refer to [facility] follow-up with his headache specialist  History of stroke by symptoms status post tPA/TNK 2020 07/2020 twice admitted at outside hospital for strokelike symptoms status post TNK/tPA x 2. Concern for migraine versus conversion disorder.  Hypertension Stable Long term BP goal normotensive  Hyperlipidemia Home meds: Lipitor 10 LDL 118, goal < 70 Now on Lipitor 40 Continue statin at discharge  Other Stroke Risk Factors   Other Active Problems Anxiety Insomnia   DISCHARGE EXAM Temp:  [97.7 F (36.5 C)-98.4 F (36.9 C)] 98.1 F (36.7 C) (05/31 1600) Pulse Rate:  [64-85] 70 (05/31 1700) Resp:  [14-34] 15 (05/31 1700) BP: (103-136)/(53-98) 115/72 (05/31 1802) SpO2:  [94 %-100 %] 96 % (05/31 1700) Weight:  [45.4 kg] 45.4 kg (05/30 2259)  General - Well nourished, well developed, in no apparent distress.  Ophthalmologic - fundi not visualized due to noncooperation.  Cardiovascular - Regular rhythm and rate.  Mental Status -  Level of arousal and orientation to time, place, and person were intact. Language including expression, naming, repetition, comprehension was assessed and found intact. Fund of Knowledge was assessed and was intact.  Cranial Nerves II - XII - II - Visual field intact OU. III, IV, VI - Extraocular movements intact. V - Facial sensation intact bilaterally. VII - Facial movement intact bilaterally. VIII - Hearing & vestibular intact bilaterally. X - Palate elevates symmetrically. XI - Chin  turning & shoulder shrug intact bilaterally. XII - Tongue protrusion intact.  Motor Strength - The patient's strength was normal in all extremities and pronator drift was absent.  Bulk was normal and fasciculations were absent.   Motor Tone - Muscle tone was assessed at the neck and appendages and was normal.  Reflexes - The patient's reflexes were symmetrical in all extremities and she had no pathological reflexes.  Sensory - Light touch, temperature/pinprick were assessed and were symmetrical except feeding left upper extremity slightly decreased light touch sensation down the right.    Coordination - The patient had normal movements in the hands and feet with no ataxia or dysmetria.  Tremor was absent.  Gait and Station - deferred.   Discharge Diet       Diet   Diet regular Room service appropriate? Yes; Fluid consistency: Thin   liquids  DISCHARGE PLAN Disposition: Home today aspirin 325 mg daily for secondary stroke prevention. Ongoing stroke risk factor control by Primary Care Physician at time of discharge Follow-up PCP Sandford Craze, NP in 2 weeks. Follow-up in [facility] in 4 weeks, office to schedule an appointment.   40 minutes were spent preparing discharge.  Marvel Plan, MD PhD Stroke Neurology 12/28/2022 7:08 PM

## 2022-12-28 NOTE — Plan of Care (Signed)
  Problem: Education: Goal: Knowledge of General Education information will improve Description: Including pain rating scale, medication(s)/side effects and non-pharmacologic comfort measures Outcome: Progressing   Problem: Clinical Measurements: Goal: Ability to maintain clinical measurements within normal limits will improve Outcome: Progressing Goal: Will remain free from infection Outcome: Progressing Goal: Diagnostic test results will improve Outcome: Progressing Goal: Respiratory complications will improve Outcome: Progressing Goal: Cardiovascular complication will be avoided Outcome: Progressing   Problem: Health Behavior/Discharge Planning: Goal: Ability to manage health-related needs will improve Outcome: Progressing   Problem: Education: Goal: Knowledge of disease or condition will improve Outcome: Progressing Goal: Knowledge of secondary prevention will improve (MUST DOCUMENT ALL) Outcome: Progressing Goal: Knowledge of patient specific risk factors will improve Misty Beck N/A or DELETE if not current risk factor) Outcome: Progressing   Problem: Ischemic Stroke/TIA Tissue Perfusion: Goal: Complications of ischemic stroke/TIA will be minimized Outcome: Progressing   Problem: Education: Goal: Knowledge of disease or condition will improve Outcome: Progressing Goal: Knowledge of secondary prevention will improve (MUST DOCUMENT ALL) Outcome: Progressing Goal: Knowledge of patient specific risk factors will improve Misty Beck N/A or DELETE if not current risk factor) Outcome: Progressing   Problem: Clinical Measurements: Goal: Ability to maintain clinical measurements within normal limits will improve Outcome: Progressing Goal: Will remain free from infection Outcome: Progressing Goal: Diagnostic test results will improve Outcome: Progressing Goal: Respiratory complications will improve Outcome: Progressing Goal: Cardiovascular complication will be avoided Outcome:  Progressing

## 2022-12-28 NOTE — Progress Notes (Signed)
OT Cancellation Note  Patient Details Name: MEUY BINETTI MRN: [id number] DOB: 1971/03/02   Cancelled Treatment:    Reason Eval/Treat Not Completed: Active bedrest order;Other (comment) Strict bedrest orders active. Per chart, thrombolysis was administered at 1900 on 5/30. Will hold therapy attempts until cleared for OOB activity by MD.  Lorre Munroe 12/28/2022, 7:24 AM

## 2022-12-28 NOTE — TOC CM/SW Note (Signed)
Transition of Care [facility]) - Inpatient Brief Assessment   Patient Details  Name: MACAYLEE BIRELEY MRN: [id number] Date of Birth: [birth year]  [facility]) CM/SW Contact:    Gala Lewandowsky, RN Phone Number: 12/28/2022, 3:52 PM   Clinical Narrative: Patient presented for left-sided numbness weakness and facial twitching. No PT/OT recommendations for home at this time. Case Manager will continue to follow for transition of care needs.    Transition of Care Asessment: Insurance and Status: Insurance coverage has been reviewed Patient has primary care physician: Yes     Prior/Current Home Services: No current home services   Readmission risk has been reviewed: Yes Transition of care needs: no transition of care needs at this time

## 2022-12-28 NOTE — Evaluation (Signed)
Occupational Therapy Evaluation/Discharge Patient Details Name: Misty Beck MRN: [id number] DOB: 10/17/1970 Today's Date: 12/28/2022   History of Present Illness 52 yo female presents to [facility] on 5/30 with L-sided numbness, weakness, facial twitching. TNK administered 1902 5/30. PMH includes strokelike symptoms (migraine vs conversion), , anxiety, depression, hyperlipidemia, insomnia.   Clinical Impression   Cleared by stroke team for PT/OT evaluations this afternoon. Pt with minor L UE strength and sensation deficits but overall WFL for daily tasks. No appreciable visual or coordination deficits. Pt able to manage ADLs without physical assistance. Husband present, reports able to assist with heavier IADLs as needed. No further skilled OT services needed at acute level. Pt functionally appropriate for DC home once medically cleared.      Recommendations for follow up therapy are one component of a multi-disciplinary discharge planning process, led by the attending physician.  Recommendations may be updated based on patient status, additional functional criteria and insurance authorization.   Assistance Recommended at Discharge None  Patient can return home with the following      Functional Status Assessment  Patient has not had a recent decline in their functional status  Equipment Recommendations  None recommended by OT    Recommendations for Other Services       Precautions / Restrictions Precautions Precautions: None Restrictions Weight Bearing Restrictions: No      Mobility Bed Mobility Overal bed mobility: Modified Independent                  Transfers Overall transfer level: Independent                        Balance Overall balance assessment: No apparent balance deficits (not formally assessed)                                         ADL either performed or assessed with clinical judgement   ADL Overall ADL's : Independent                                              Vision Baseline Vision/History: 0 No visual deficits;1 Wears glasses (readers) Ability to See in Adequate Light: 0 Adequate Patient Visual Report: No change from baseline Vision Assessment?: No apparent visual deficits;Yes Eye Alignment: Within Functional Limits Ocular Range of Motion: Within Functional Limits Alignment/Gaze Preference: Within Defined Limits Tracking/Visual Pursuits: Able to track stimulus in all quads without difficulty Convergence: Within functional limits Visual Fields: No apparent deficits     Perception     Praxis      Pertinent Vitals/Pain Pain Assessment Pain Assessment: Faces Faces Pain Scale: No hurt     Hand Dominance Left   Extremity/Trunk Assessment Upper Extremity Assessment Upper Extremity Assessment: Overall WFL for tasks assessed;LUE deficits/detail LUE Deficits / Details: very mild weakness in comparison to R nondominant UE; reports some minor diminished sensation but WFL. baseline L shoulder injury so unable to fully lift overhead   Lower Extremity Assessment Lower Extremity Assessment: Defer to PT evaluation LLE Sensation: decreased light touch   Cervical / Trunk Assessment Cervical / Trunk Assessment: Normal   Communication Communication Communication: No difficulties   Cognition Arousal/Alertness: Awake/alert Behavior During Therapy: WFL for tasks assessed/performed Overall Cognitive Status: Within  Functional Limits for tasks assessed                                       General Comments       Exercises     Shoulder Instructions      Home Living Family/patient expects to be discharged to:: Private residence Living Arrangements: Spouse/significant other Available Help at Discharge: Family Type of Home: House Home Access: Stairs to enter Secretary/administrator of Steps: 2   Home Layout: One level               Home Equipment: Shower  seat          Prior Functioning/Environment Prior Level of Function : Independent/Modified Independent                        OT Problem List:        OT Treatment/Interventions:      OT Goals(Current goals can be found in the care plan section) Acute Rehab OT Goals Patient Stated Goal: home soon OT Goal Formulation: All assessment and education complete, DC therapy  OT Frequency:      Co-evaluation              AM-PAC OT "6 Clicks" Daily Activity     Outcome Measure Help from another person eating meals?: None Help from another person taking care of personal grooming?: None Help from another person toileting, which includes using toliet, bedpan, or urinal?: None Help from another person bathing (including washing, rinsing, drying)?: None Help from another person to put on and taking off regular upper body clothing?: None Help from another person to put on and taking off regular lower body clothing?: None 6 Click Score: 24   End of Session    Activity Tolerance: Patient tolerated treatment well Patient left: in bed;with call bell/phone within reach;with family/visitor present  OT Visit Diagnosis: Muscle weakness (generalized) (M62.81)                Time: 1610-9604 OT Time Calculation (min): 12 min Charges:  OT General Charges $OT Visit: 1 Visit OT Evaluation $OT Eval Low Complexity: 1 Low  Bradd Canary, OTR/L Acute Rehab Services Office: [phone]   Lorre Munroe 12/28/2022, 1:50 PM

## 2022-12-29 LAB — HEMOGLOBIN A1C
Hgb A1c MFr Bld: 5.6 % (ref 4.8–5.6)
Mean Plasma Glucose: 114 mg/dL

## 2022-12-31 ENCOUNTER — Telehealth: Payer: Self-pay

## 2022-12-31 NOTE — Transitions of Care (Post Inpatient/ED Visit) (Signed)
12/31/2022  Name: Misty Beck MRN: [id number] DOB: [birth year]  Today's TOC FU Call Status: Today's TOC FU Call Status:: Successful TOC FU Call Competed TOC FU Call Complete Date: 12/31/22  Transition Care Management Follow-up Telephone Call Date of Discharge: 12/27/22 Discharge Facility: [facility]) Type of Discharge: Inpatient Admission Primary Inpatient Discharge Diagnosis:: stoke Reason for ED Visit: Other:, Neurologic How have you been since you were released from the hospital?: Same Any questions or concerns?: Yes Patient Questions/Concerns:: patient feels like she come home from the hospital to soon , she stated that she is still noticing numbness on her left side Patient Questions/Concerns Addressed: Notified Provider of Patient Questions/Concerns  Items Reviewed: Did you receive and understand the discharge instructions provided?: Yes Medications obtained,verified, and reconciled?: Yes (Medications Reviewed) Any new allergies since your discharge?: No Dietary orders reviewed?: No Do you have support at home?: Yes People in Home: spouse  Medications Reviewed Today: Medications Reviewed Today     Reviewed by Annabell Sabal, CMA (Certified Medical Assistant) on 12/31/22 at 1636  Med List Status: <None>   Medication Order Taking? Sig Documenting Provider Last Dose Status Informant  aspirin EC 325 MG tablet [id number] Yes Take 1 tablet (325 mg total) by mouth daily. Sandford Craze, NP Taking Active Self, Pharmacy Records  aspirin-acetaminophen-caffeine [facility] MIGRAINE) [phone] MG tablet [id number] Yes Take 2 tablets by mouth 3 (three) times daily as needed for headache or migraine. [provider] Taking Active Self, Pharmacy Records  atorvastatin (LIPITOR) 40 MG tablet [id number] Yes Take 1 tablet (40 mg total) by mouth daily. Marvel Plan, MD Taking Active   Cyanocobalamin (VITAMIN B-12 PO) [id number] Yes Take 1 capsule by mouth 3 (three) times a week.  [provider] Taking Active Self, Pharmacy Records           Med Note (CRUTHIS, CHLOE C   Fri Dec 28, 2022  8:04 AM) Pt is adamant she is taking this medication in addition to her injections.   cyanocobalamin (VITAMIN B12) 1000 MCG/ML injection [id number] Yes Inject 1 mL (1,000 mcg total) into the muscle once a week. Sandford Craze, NP Taking Active Self, Pharmacy Records  diclofenac Sodium (VOLTAREN) 1 % GEL [id number] Yes APPLY 2 GRAMS TOPICALLY 4 TIMES DAILY  Patient taking differently: Apply 1 Application topically as needed (pain).   Sandford Craze, NP Taking Active Self, Pharmacy Records  Eluxadoline [facility]) 100 MG TABS [id number] Yes Take 1 tablet (100 mg total) by mouth 2 (two) times daily. Meredith Pel, NP Taking Active Self, Pharmacy Records           Med Note (CRUTHIS, Marcy Siren   Fri Dec 28, 2022  8:04 AM) No fill hx found. Pt is adamant she is taking this medication.   LORazepam (ATIVAN) 0.5 MG tablet [id number] Yes Take 1 tablet (0.5 mg total) by mouth every 8 (eight) hours.  Patient taking differently: Take 0.5 mg by mouth as needed for anxiety.   Sandford Craze, NP Taking Active Self, Pharmacy Records  Melatonin 10 MG TABS [id number] Yes Take 10 mg by mouth at bedtime as needed (sleep). [provider] Taking Active Self, Pharmacy Records  methocarbamol (ROBAXIN) 500 MG tablet [id number] Yes Take 1 tablet (500 mg total) by mouth every 8 (eight) hours as needed for muscle spasms.  Patient taking differently: Take 500 mg by mouth as needed for muscle spasms.   Cammy Copa, MD Taking Active Self, Pharmacy Records  ondansetron (ZOFRAN-ODT) 4 MG disintegrating tablet  [id number] Yes Take 1 tablet (4 mg total) by mouth every 8 (eight) hours as needed for nausea or vomiting.  Patient taking differently: Take 4 mg by mouth as needed for nausea or vomiting.   Sandford Craze, NP Taking Active Self, Pharmacy Records  promethazine (PHENERGAN) 12.5  MG tablet [id number] Yes Take 1 tablet (12.5 mg total) by mouth every 8 (eight) hours as needed for nausea or vomiting.  Patient taking differently: Take 12.5 mg by mouth as needed for nausea or vomiting.   Eulis Foster, FNP Taking Active Self, Pharmacy Records  sertraline (ZOLOFT) 100 MG tablet [id number] Yes TAKE 1 TABLET BY MOUTH DAILY  Patient taking differently: Take 150 mg by mouth daily.   Sandford Craze, NP Taking Active Self, Pharmacy Records           Med Note (CRUTHIS, Marcy Siren   Fri Dec 28, 2022  8:05 AM) Pt is adamant she is taking 150 mg daily. Dispense report does not support this claim.   traZODone (DESYREL) 100 MG tablet [id number] Yes Take 1 tablet (100 mg total) by mouth at bedtime as needed for sleep.  Patient taking differently: Take 100 mg by mouth at bedtime.   Sandford Craze, NP Taking Active Self, Pharmacy Records            Home Care and Equipment/Supplies: Were Home Health Services Ordered?: No Any new equipment or medical supplies ordered?: No  Functional Questionnaire: Do you need assistance with bathing/showering or dressing?: No Do you need assistance with meal preparation?: Yes Do you need assistance with eating?: No Do you have difficulty maintaining continence: No Do you need assistance with getting out of bed/getting out of a chair/moving?: No Do you have difficulty managing or taking your medications?: No  Follow up appointments reviewed: PCP Follow-up appointment confirmed?: Yes Date of PCP follow-up appointment?: 01/04/23 Follow-up Provider: [facility] Follow-up appointment confirmed?: No Reason Specialist Follow-Up Not Confirmed: Patient has Specialist Provider Number and will Call for Appointment Do you need transportation to your follow-up appointment?: No Do you understand care options if your condition(s) worsen?: Yes-patient verbalized understanding    SIGNATURE Fredirick Maudlin

## 2023-01-01 ENCOUNTER — Telehealth: Payer: Self-pay

## 2023-01-01 NOTE — Patient Outreach (Signed)
  Emmi Stroke Care Coordination Follow Up  01/01/2023 Name:  Misty Beck MRN:  [id number] DOB:  May 22, 1971  Subjective: Misty Beck is a 52 y.o. year old female who is a primary care patient of Sandford Craze, NP  An Emmi alert was received indicating patient responded to questions: Scheduled a follow-up appointment?. I reached out by phone to follow up on the alert and spoke to Patient. Patient sates she "feels like she was released from he hospital too early." She voices that she is still weak and has some numbness to left side. Patient endorses that she was having these sxs prior to discharge and that they have not gotten any worse. Discussed with pt post-stroke sxs and recovery phase. Instructed to seek medical attention for any worsening and/or unresolved sxs. She voices that her boyfriend is staying with her and helping her out. She is walking without any assistance but being "careful and slow." Appetite WNL for pt. No issues with elimination. She confirms she has all her meds. She has PCP appt on 01/04/23. She has not called neuro office to make an appt-states she was waiting on PCP to make an referral to neuro. Advised pt that referral had ben placed while in the hospital and to go ahead and call and make an appt. She voiced understanding. She states her boyfriend will be able to drive her to appts. She denies any RN CM needs or concerns at this time.    Care Coordination Interventions:  Yes, provided    TOC Interventions Today    Flowsheet Row Most Recent Value  TOC Interventions   TOC Interventions Discussed/Reviewed TOC Interventions Discussed, Post discharge activity limitations per provider      Interventions Today    Flowsheet Row Most Recent Value  Chronic Disease   Chronic disease during today's visit Other  [post stroke]  General Interventions   General Interventions Discussed/Reviewed General Interventions Discussed, Doctor Visits  Doctor Visits Discussed/Reviewed  Doctor Visits Discussed, Specialist, PCP  PCP/Specialist Visits Compliance with follow-up visit  Nutrition Interventions   Nutrition Discussed/Reviewed Nutrition Discussed, Adding fruits and vegetables, Decreasing sugar intake  Pharmacy Interventions   Pharmacy Dicussed/Reviewed Pharmacy Topics Discussed, Medications and their functions  Safety Interventions   Safety Discussed/Reviewed Safety Discussed       Follow up plan: Advised patient that they would continue to get automated EMMI-Stroke post discharge calls to assess how they are doing following recent hospitalization and will receive a call from a nurse if any of their responses were abnormal. Patient voiced understanding and was appreciative of f/u call.   Encounter Outcome:  Pt. Visit Completed   Alessandra Grout [facility] Care Management Care Management Community Coordinator Direct Phone: [phone] Toll Free: [phone] Fax: [phone]

## 2023-01-01 NOTE — Patient Outreach (Signed)
Received a red flag Emmi stroke notification for [name]. I have assigned Antionette Fairy, RN to call for follow up and determine if there are any Case Management needs.    Iverson Alamin, Donivan [facility] Care Management Assistant [facility] Care Management [phone]

## 2023-01-04 ENCOUNTER — Telehealth: Payer: Self-pay | Admitting: Family

## 2023-01-04 ENCOUNTER — Ambulatory Visit (INDEPENDENT_AMBULATORY_CARE_PROVIDER_SITE_OTHER): Payer: Medicaid Other | Admitting: Family

## 2023-01-04 VITALS — BP 95/63 | HR 86 | Temp 98.3°F | Resp 16 | Wt 94.8 lb

## 2023-01-04 DIAGNOSIS — E538 Deficiency of other specified B group vitamins: Secondary | ICD-10-CM

## 2023-01-04 DIAGNOSIS — G43109 Migraine with aura, not intractable, without status migrainosus: Secondary | ICD-10-CM | POA: Diagnosis not present

## 2023-01-04 DIAGNOSIS — G4700 Insomnia, unspecified: Secondary | ICD-10-CM

## 2023-01-04 MED ORDER — SERTRALINE HCL 100 MG PO TABS: 150.0000 mg | ORAL_TABLET | Freq: Every day | ORAL | 1 refills | Status: DC

## 2023-01-04 NOTE — Assessment & Plan Note (Signed)
>>  ASSESSMENT AND PLAN FOR COMPLICATED MIGRAINE WRITTEN ON 01/04/2023  6:08 PM BY O'SULLIVAN, Krysia Zahradnik, NP  Work up did not reveal stroke.  She will continue secondary stroke prevention with aspirin  325mg  once daily and statin.  BP is great- she does not have hypertension. I advised her to schedule a follow up visit with the stroke clinic.    She does have daily headaches in addition to migraines- I advised her to address this with her neurologist at follow up.

## 2023-01-04 NOTE — Assessment & Plan Note (Signed)
Not clear if she is still doing injections- see mychart message to patient.

## 2023-01-04 NOTE — Progress Notes (Signed)
Subjective:   By signing my name below, I, Isabelle Course, attest that this documentation has been prepared under the direction and in the presence of Lemont Fillers, NP 01/04/23   Patient ID: Misty Beck, female    DOB: August 15, 1970, 52 y.o.   MRN: [id number]  Chief Complaint  Patient presents with   Hospitalization Follow-up    HPI Patient is in today for a hospitalization follow up. She is accompanied by her husband.   She presented to the ED on 5/30 with sudden onset of left-sided symptoms that included weakness, numbness and facial drooping. Her symptoms began while driving back from [place]. She reports that she had a nagging headache the day of, but does not believe it caused her other symptoms. She has a prior history of stroke.   Stroke was ruled out with negative imaging. Her head and neck CT scan revealed good circulation, no blockages. Her MRI was normal with no evidence of stroke at that time. Echo was normal.   Discharge diagnosis was consistent with complex migraine. She was discharged on 5/31 and was prescribed 40 mg Atorvastatin.   Today, she complains of continued left sided weakness and facial drooping. She also endorses some double vision/blurring in her left eye and clear vision in her right eye. Her husband reports she tends to get headaches very frequently. She states she did not have headaches with prior strokes.  She reports her LDL was increased during her cholesterol. She is taking her medications daily, in the morning. She is compliant with 325 mg Aspirin, 40 mg Atorvastatin.   Lab Results  Component Value Date   CHOL 199 12/28/2022   HDL 66 12/28/2022   LDLCALC 118 (H) 12/28/2022   TRIG 73 12/28/2022   CHOLHDL 3.0 12/28/2022    Past Medical History:  Diagnosis Date   Asthma    Depression with anxiety 04/20/2021   Referral source Dr Amada Kingfisher bonsu   Diarrhea    referral source   Hyperlipidemia    referral source   Insomnia 04/20/2021    referral source Dr Christin Fudge   Received intravenous tissue plasminogen activator (tPA) in emergency department 10/24/2021   Stroke [facility])    Dec, 2021, Jan 2022    Past Surgical History:  Procedure Laterality Date   APPENDECTOMY  1992   left shoulder surgery     referral source Dr Bari Mantis   PARTIAL HYSTERECTOMY     2009    Family History  Problem Relation Age of Onset   Stroke Mother    Cancer Mother    Colon polyps Mother    Skin cancer Mother    Breast cancer Mother 65   Kidney disease Father    Hyperlipidemia Father    COPD Father    Heart disease Father    Colon cancer Neg Hx    Rectal cancer Neg Hx    Stomach cancer Neg Hx     Social History   Socioeconomic History   Marital status: Divorced    Spouse name: Not on file   Number of children: 2   Years of education: Not on file   Highest education level: Not on file  Occupational History   Not on file  Tobacco Use   Smoking status: Former    Types: Cigarettes   Smokeless tobacco: Never  Substance and Sexual Activity   Alcohol use: Not Currently   Drug use: Not Currently   Sexual activity: Not on file  Other  Topics Concern   Not on file  Social History Narrative   Not on file   Social Determinants of Health   Financial Resource Strain: Not on file  Food Insecurity: Not on file  Transportation Needs: Not on file  Physical Activity: Not on file  Stress: Not on file  Social Connections: Not on file  Intimate Partner Violence: Not on file    Outpatient Medications Prior to Visit  Medication Sig Dispense Refill   aspirin EC 325 MG tablet Take 1 tablet (325 mg total) by mouth daily.     aspirin-acetaminophen-caffeine (EXCEDRIN MIGRAINE) 250-250-65 MG tablet Take 2 tablets by mouth 3 (three) times daily as needed for headache or migraine.     atorvastatin (LIPITOR) 40 MG tablet Take 1 tablet (40 mg total) by mouth daily. 30 tablet 2   Cyanocobalamin (VITAMIN B-12 PO) Take 1 capsule by mouth 3  (three) times a week.     cyanocobalamin (VITAMIN B12) 1000 MCG/ML injection Inject 1 mL (1,000 mcg total) into the muscle once a week. 10 mL 0   diclofenac Sodium (VOLTAREN) 1 % GEL APPLY 2 GRAMS TOPICALLY 4 TIMES DAILY (Patient taking differently: Apply 1 Application topically as needed (pain).) 400 g 1   Eluxadoline (VIBERZI) 100 MG TABS Take 1 tablet (100 mg total) by mouth 2 (two) times daily. 180 tablet 0   LORazepam (ATIVAN) 0.5 MG tablet Take 1 tablet (0.5 mg total) by mouth every 8 (eight) hours. (Patient taking differently: Take 0.5 mg by mouth as needed for anxiety.) 30 tablet 0   Melatonin 10 MG TABS Take 10 mg by mouth at bedtime as needed (sleep).     ondansetron (ZOFRAN-ODT) 4 MG disintegrating tablet Take 1 tablet (4 mg total) by mouth every 8 (eight) hours as needed for nausea or vomiting. (Patient taking differently: Take 4 mg by mouth as needed for nausea or vomiting.) 30 tablet 0   promethazine (PHENERGAN) 12.5 MG tablet Take 1 tablet (12.5 mg total) by mouth every 8 (eight) hours as needed for nausea or vomiting. (Patient taking differently: Take 12.5 mg by mouth as needed for nausea or vomiting.) 20 tablet 0   traZODone (DESYREL) 100 MG tablet Take 1 tablet (100 mg total) by mouth at bedtime as needed for sleep. (Patient taking differently: Take 100 mg by mouth at bedtime.) 90 tablet 0   methocarbamol (ROBAXIN) 500 MG tablet Take 1 tablet (500 mg total) by mouth every 8 (eight) hours as needed for muscle spasms. (Patient taking differently: Take 500 mg by mouth as needed for muscle spasms.) 35 tablet 0   sertraline (ZOLOFT) 100 MG tablet TAKE 1 TABLET BY MOUTH DAILY (Patient taking differently: Take 150 mg by mouth daily.) 90 tablet 0   No facility-administered medications prior to visit.    Allergies  Allergen Reactions   Doxycycline Other (See Comments)    Burning skin     Review of Systems  Eyes:  Positive for double vision (left eye).  Neurological:  Positive for  weakness (left sided) and headaches.       (+) left sided facial drooping       Objective:    Physical Exam Constitutional:      General: She is not in acute distress.    Appearance: Normal appearance. She is well-developed.  HENT:     Head: Normocephalic and atraumatic.     Right Ear: External ear normal.     Left Ear: External ear normal.  Eyes:  General: No scleral icterus. Neck:     Thyroid: No thyromegaly.  Cardiovascular:     Rate and Rhythm: Normal rate and regular rhythm.     Heart sounds: Normal heart sounds. No murmur heard. Pulmonary:     Effort: Pulmonary effort is normal. No respiratory distress.     Breath sounds: Normal breath sounds. No wheezing.  Musculoskeletal:     Cervical back: Neck supple.  Skin:    General: Skin is warm and dry.  Neurological:     Mental Status: She is alert and oriented to person, place, and time.     Cranial Nerves: Cranial nerves 2-12 are intact.     Comments: 4-5/5 strength on LUE/LLE, 5/5 strength on RUE/RLE  Some fasciculation of muscles noted in the left cheek, no obvious drooping of left side of face   Psychiatric:        Mood and Affect: Mood normal.        Behavior: Behavior normal.        Thought Content: Thought content normal.        Judgment: Judgment normal.     BP 95/63 (BP Location: Right Arm, Patient Position: Sitting, Cuff Size: Small)   Pulse 86   Temp 98.3 F (36.8 C) (Oral)   Resp 16   Wt 94 lb 12.8 oz (43 kg)   SpO2 99%   BMI 16.27 kg/m  Wt Readings from Last 3 Encounters:  01/04/23 94 lb 12.8 oz (43 kg)  12/27/22 100 lb 1.4 oz (45.4 kg)  11/22/22 98 lb (44.5 kg)       Assessment & Plan:  Complicated migraine Assessment & Plan: Work up did not reveal stroke.  She will continue secondary stroke prevention with aspirin 325mg  once daily and statin.  BP is great- she does not have hypertension. I advised her to schedule a follow up visit with the stroke clinic.    She does have daily  headaches in addition to migraines- I advised her to address this with her neurologist at follow up.    Insomnia, unspecified type Assessment & Plan: Stable with nightly trazodone.    B12 deficiency Assessment & Plan: Not clear if she is still doing injections- see mychart message to patient.    Other orders -     Sertraline HCl; Take 1.5 tablets (150 mg total) by mouth daily.  Dispense: 135 tablet; Refill: 1    30 minutes spent on today's visit. Time was spent reviewing hospital record, examining patient, and counseling patient on stroke prevention/migraines,  and formulating medical plan.   I,Rachel Rivera,acting as a Neurosurgeon for Lemont Fillers, NP.,have documented all relevant documentation on the behalf of Lemont Fillers, NP,as directed by  Lemont Fillers, NP while in the presence of Lemont Fillers, NP.   I, Lemont Fillers, NP, personally preformed the services described in this documentation.  All medical record entries made by the scribe were at my direction and in my presence.  I have reviewed the chart and discharge instructions (if applicable) and agree that the record reflects my personal performance and is accurate and complete. 01/04/23   Lemont Fillers, NP

## 2023-01-04 NOTE — Telephone Encounter (Signed)
See mychart.  

## 2023-01-04 NOTE — Assessment & Plan Note (Signed)
Stable with nightly trazodone.  ?

## 2023-01-04 NOTE — Assessment & Plan Note (Addendum)
Work up did not reveal stroke.  She will continue secondary stroke prevention with aspirin 325mg  once daily and statin.  BP is great- she does not have hypertension. I advised her to schedule a follow up visit with the stroke clinic.    She does have daily headaches in addition to migraines- I advised her to address this with her neurologist at follow up.

## 2023-01-18 NOTE — Telephone Encounter (Signed)
PA CANCELLED °

## 2023-01-22 ENCOUNTER — Other Ambulatory Visit: Payer: Self-pay | Admitting: Family

## 2023-02-20 IMAGING — CT CT HEAD W/O CM
3 series · 15 of 47 positions shown, 18 images · non-contrast
Comparison: 07/16/2020 report (images not available).

CLINICAL DATA: Headache, sudden, severe



[Series 2: head wo · axial · 0.41mm/px · z∈[-472,-348]mm · 9 of 30 slices shown, 12 images]
[im 3/30  brain]
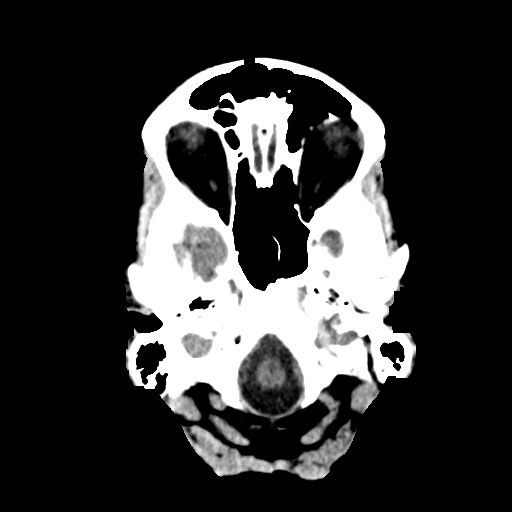
[im 3/30  bone]
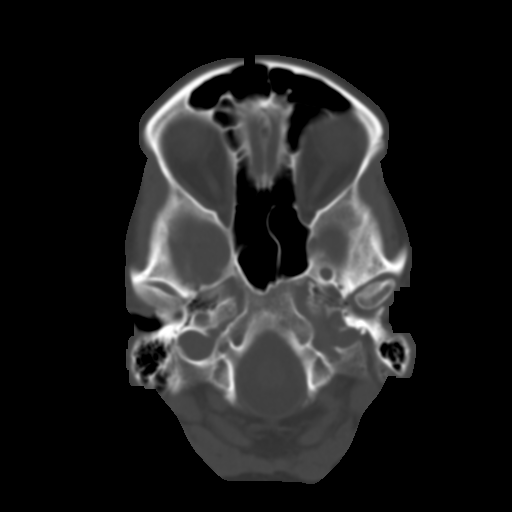
[im 6/30  brain]
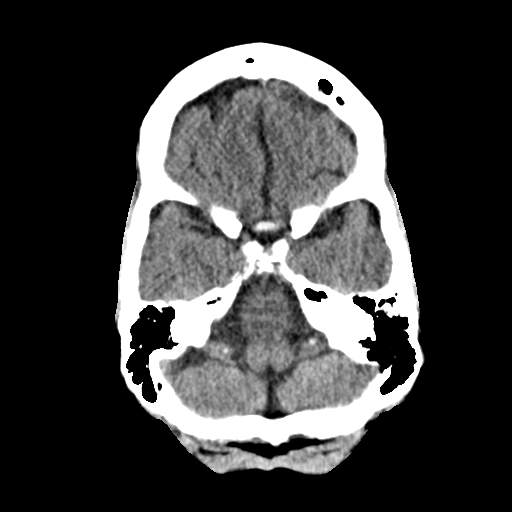
[im 9/30  brain]
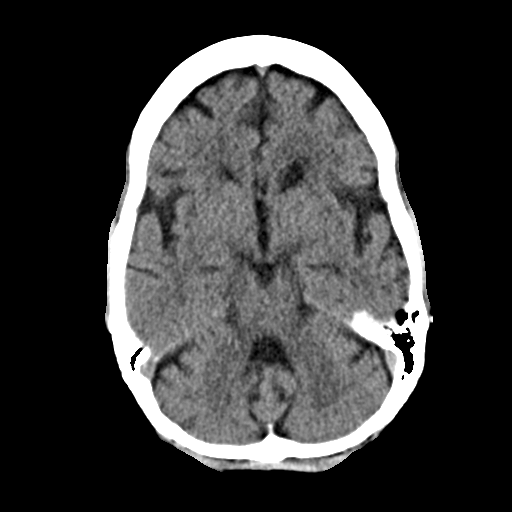
[im 12/30  brain]
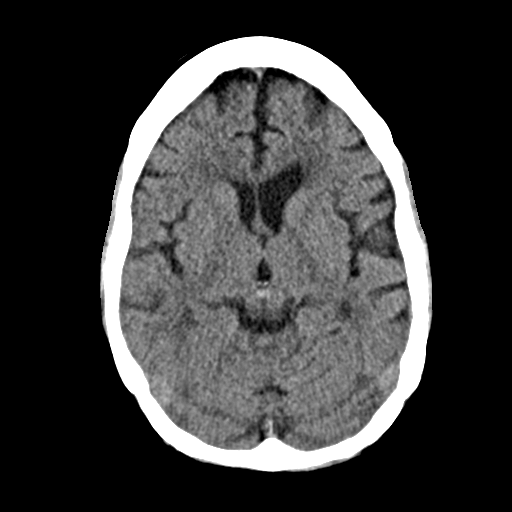
[im 16/30  brain]
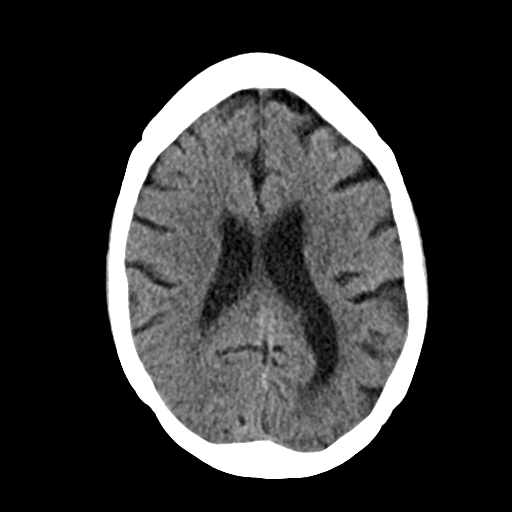
[im 16/30  bone]
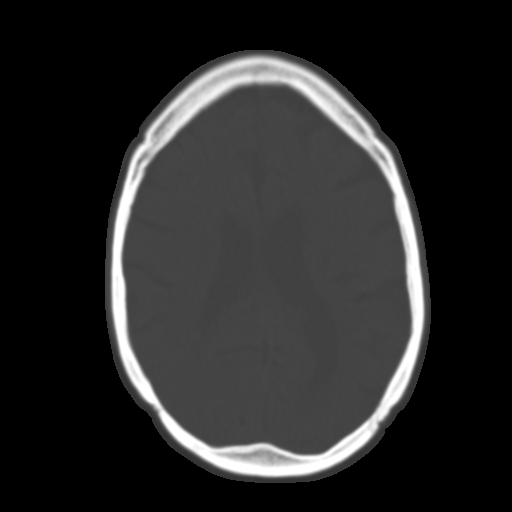
[im 19/30  brain]
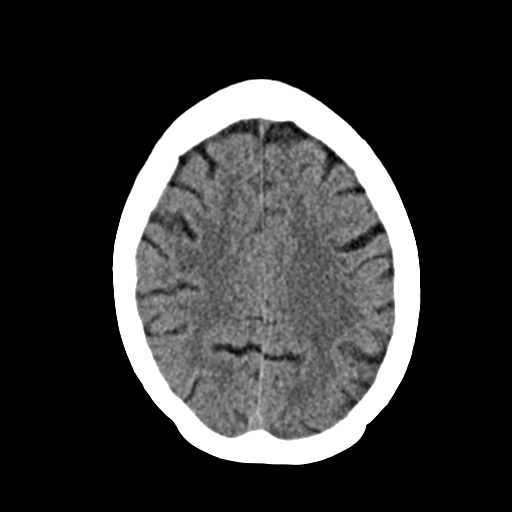
[im 22/30  brain]
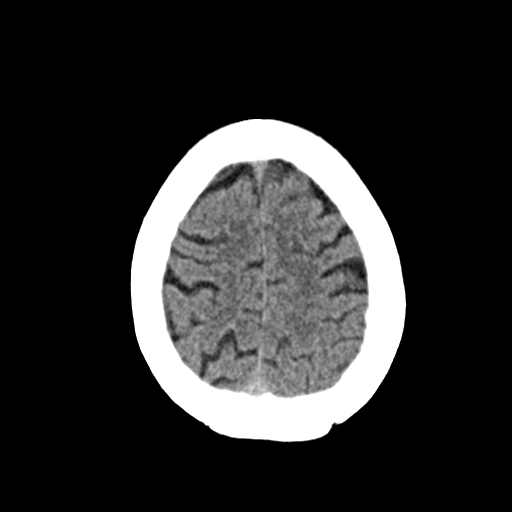
[im 25/30  brain]
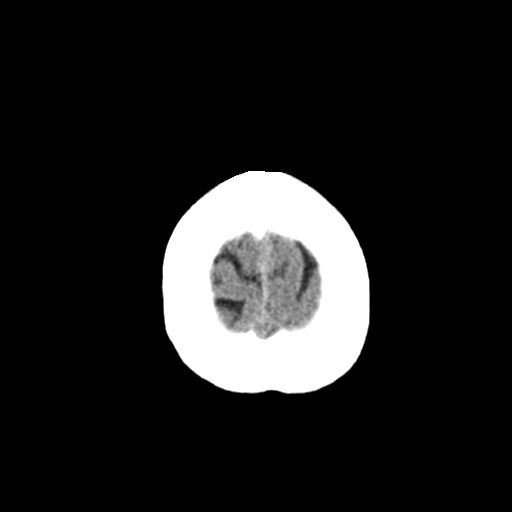
[im 28/30  brain]
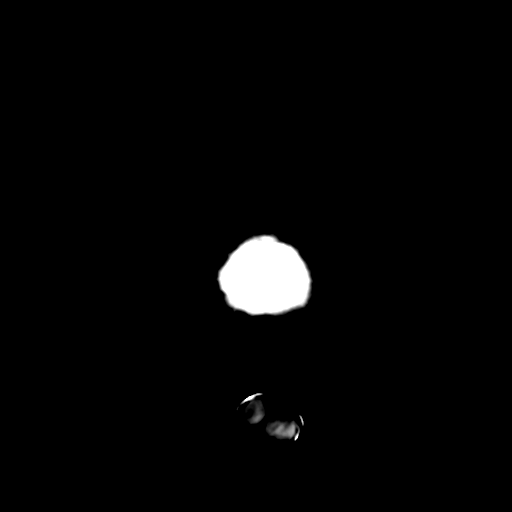
[im 28/30  bone]
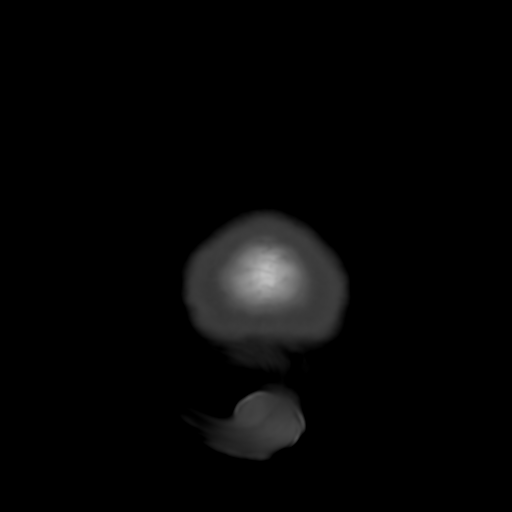

[Series 3: coronal soft · coronal · 0.29mm/px · 3 of 67 slices shown]
[im 23/67  brain]
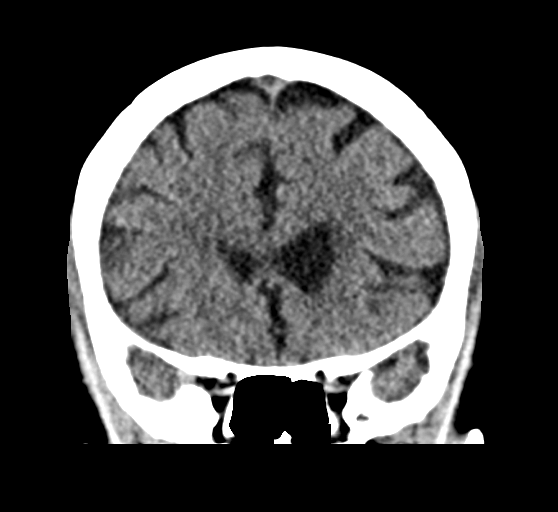
[im 30/67  brain]
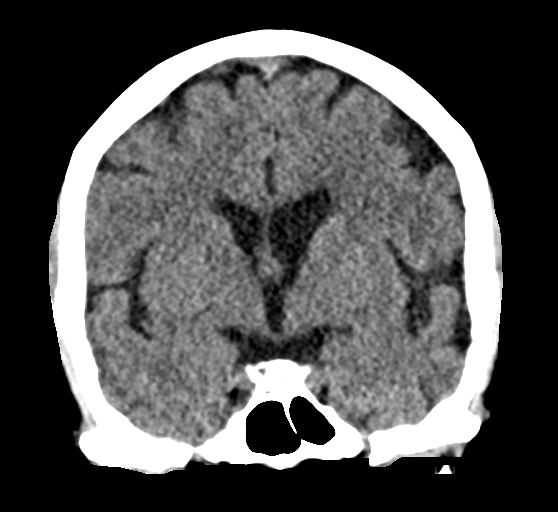
[im 37/67  brain]
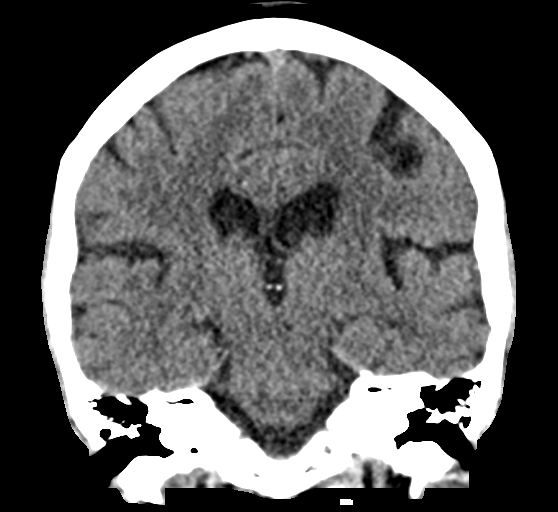

[Series 4: sag soft · sagittal · 0.30mm/px · 3 of 51 slices shown]
[im 17/51  brain]
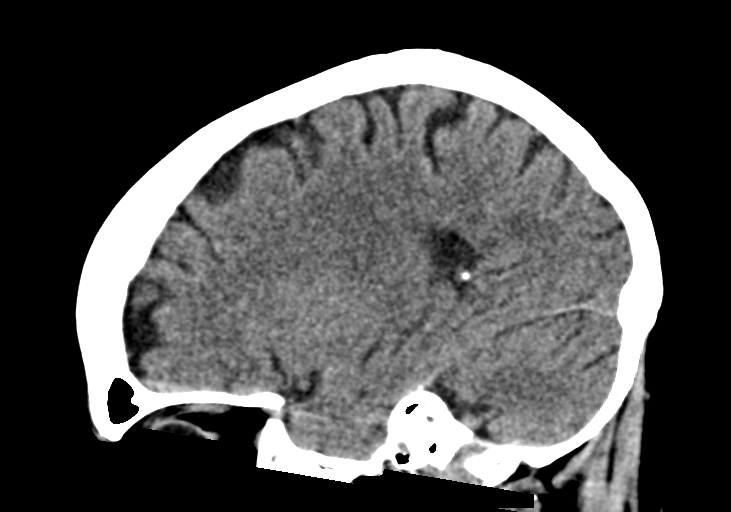
[im 26/51  brain]
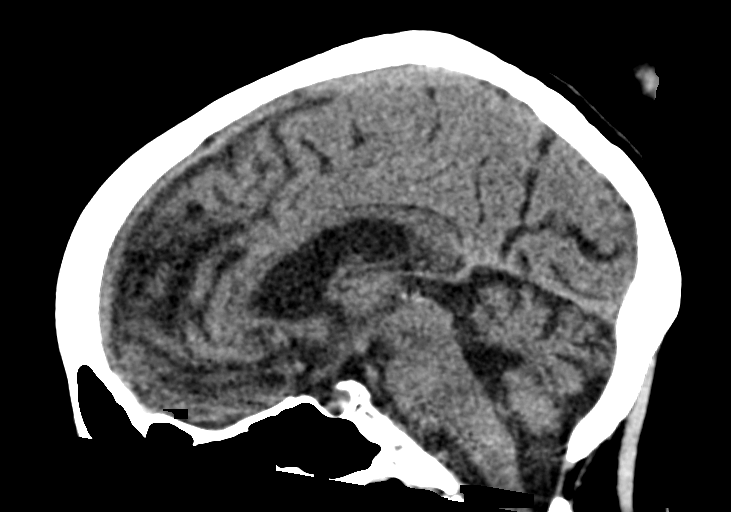
[im 34/51  brain]
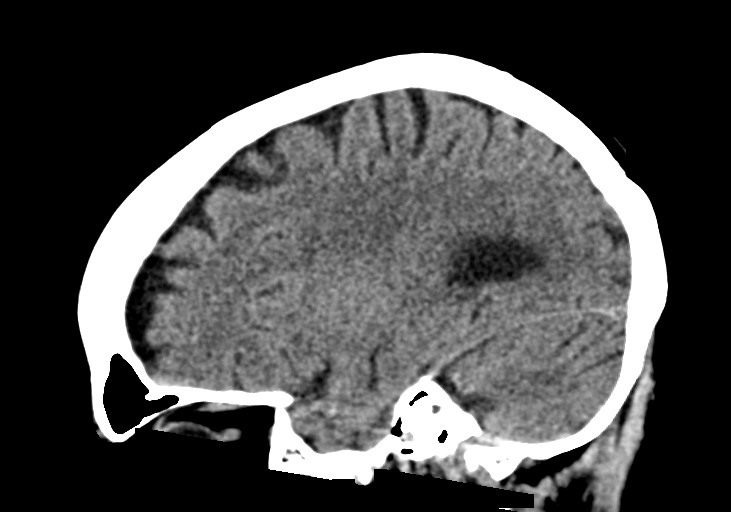

[15 of 47 positions shown; findings below may reference images not displayed]

FINDINGS: Brain: No evidence of acute large vascular territory infarction,
hemorrhage, hydrocephalus, extra-axial collection or mass
lesion/mass effect.

Vascular: No hyperdense vessel identified.

Skull: No acute fracture.

Sinuses/Orbits: Clear visualized sinuses.  Unremarkable orbits.

Other: No mastoid effusions.
IMPRESSION: No evidence of acute intracranial abnormality.

## 2023-02-20 IMAGING — DX DG CHEST 1V PORT
1 series · 1 of 1 positions shown · non-contrast
Comparison: None.

CLINICAL DATA: Altered mental status.

EXAM:
PORTABLE CHEST 1 VIEW

[chest ap]
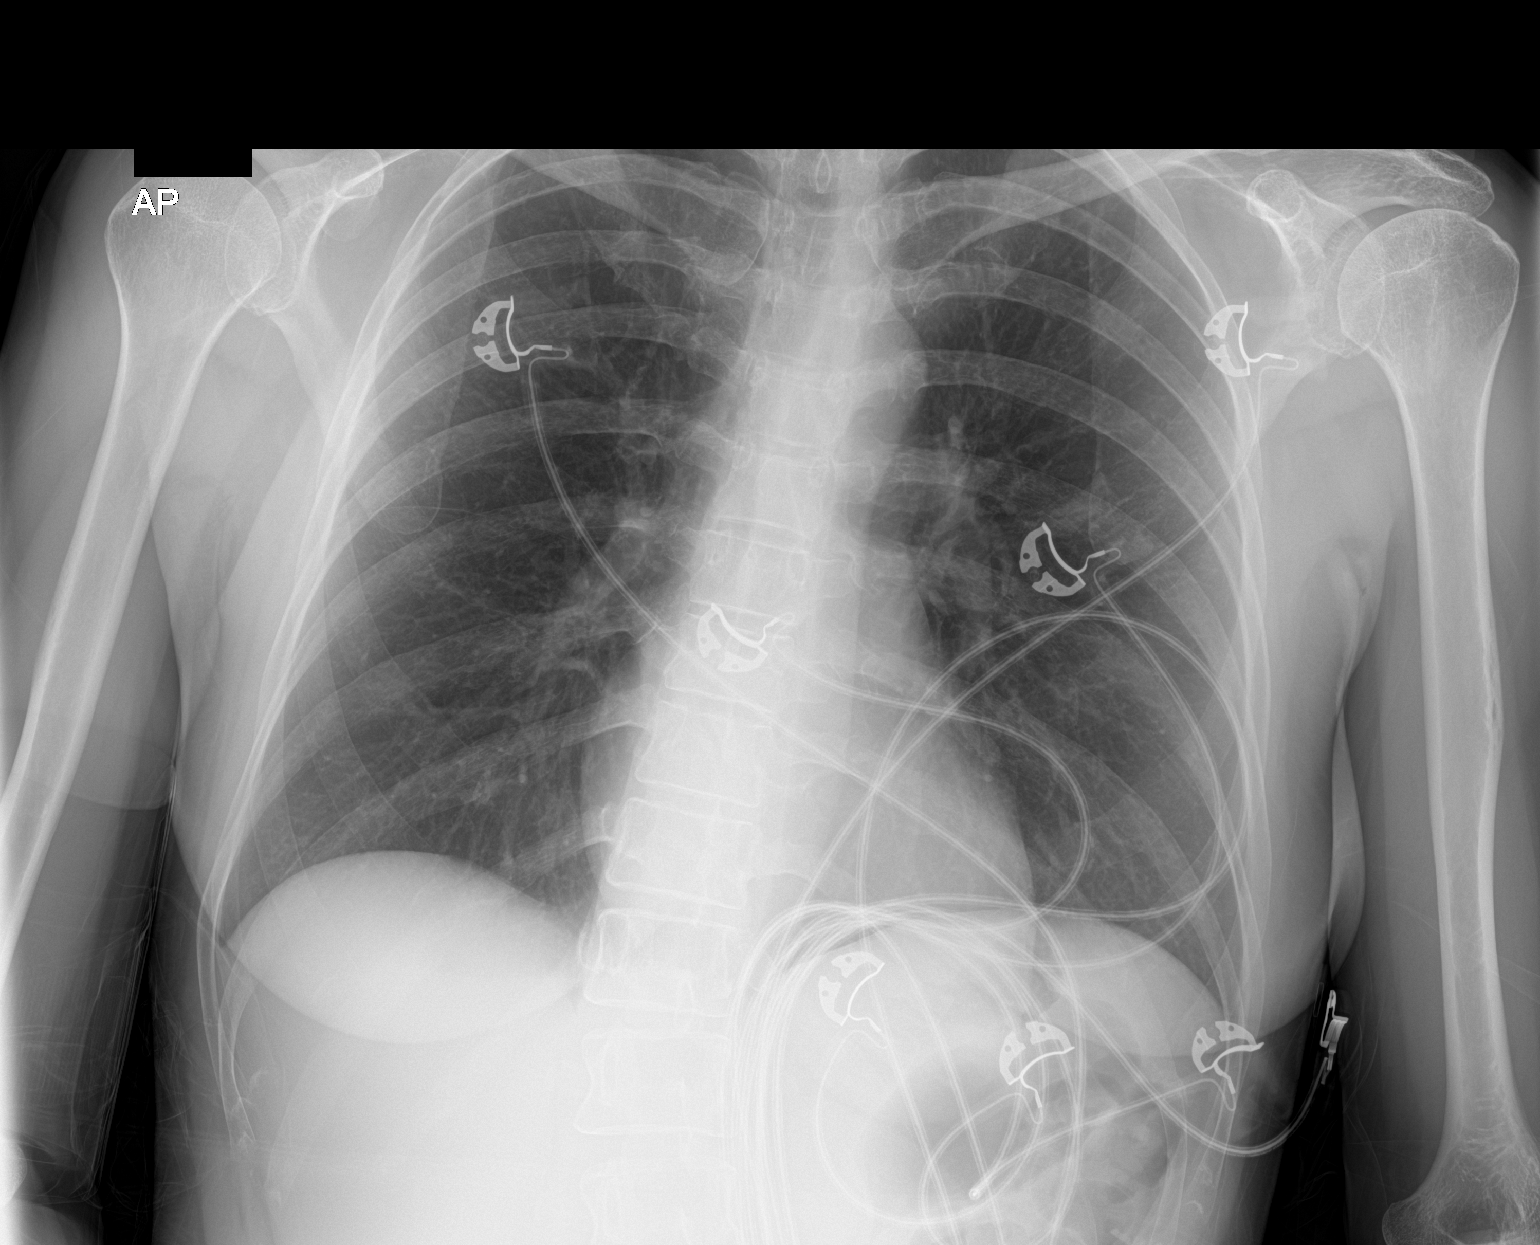

[1 of 1 positions shown; findings below may reference images not displayed]

FINDINGS: The heart size and mediastinal contours are within normal limits.
Both lungs are clear. The visualized skeletal structures are
unremarkable.
IMPRESSION: No active disease.

## 2023-02-27 ENCOUNTER — Telehealth: Payer: Self-pay

## 2023-02-27 NOTE — Transitions of Care (Post Inpatient/ED Visit) (Signed)
02/27/2023  Name: Misty Beck MRN: [id number] DOB: [birth year]  Today's TOC FU Call Status: Today's TOC FU Call Status:: Successful TOC FU Call Competed TOC FU Call Complete Date: 02/27/23  Transition Care Management Follow-up Telephone Call Date of Discharge: 02/26/23 Discharge Facility: [facility] Discharge Diagnosis:: cerebral infarction How have you been since you were released from the hospital?: Better Any questions or concerns?: No  Items Reviewed: Did you receive and understand the discharge instructions provided?: Yes Medications obtained,verified, and reconciled?: Yes (Medications Reviewed) Any new allergies since your discharge?: No Dietary orders reviewed?: Yes Do you have support at home?: Yes People in Home: spouse  Medications Reviewed Today: Medications Reviewed Today     Reviewed by Karena Addison, LPN (Licensed Practical Nurse) on 02/27/23 at [phone]  Med List Status: <None>   Medication Order Taking? Sig Documenting Provider Last Dose Status Informant  aspirin EC 325 MG tablet [id number] No Take 1 tablet (325 mg total) by mouth daily. Sandford Craze, NP Taking Active Self, Pharmacy Records  aspirin-acetaminophen-caffeine [facility] MIGRAINE) [phone] MG tablet [id number] No Take 2 tablets by mouth 3 (three) times daily as needed for headache or migraine. [provider] Taking Active Self, Pharmacy Records  atorvastatin (LIPITOR) 40 MG tablet [id number] No Take 1 tablet (40 mg total) by mouth daily. Marvel Plan, MD Taking Active   Cyanocobalamin (VITAMIN B-12 PO) [id number] No Take 1 capsule by mouth 3 (three) times a week. [provider] Taking Active Self, Pharmacy Records           Med Note (CRUTHIS, CHLOE C   Fri Dec 28, 2022  8:04 AM) Pt is adamant she is taking this medication in addition to her injections.   cyanocobalamin (VITAMIN B12) 1000 MCG/ML injection [id number] No Inject 1 mL (1,000 mcg total) into  the muscle once a week. Sandford Craze, NP Taking Active Self, Pharmacy Records  diclofenac Sodium (VOLTAREN) 1 % GEL [id number] No APPLY 2 GRAMS TOPICALLY 4 TIMES DAILY  Patient taking differently: Apply 1 Application topically as needed (pain).   Sandford Craze, NP Taking Active Self, Pharmacy Records  Eluxadoline (VIBERZI) 100 MG TABS [id number] No Take 1 tablet (100 mg total) by mouth 2 (two) times daily. Meredith Pel, NP Taking Active Self, Pharmacy Records           Med Note (CRUTHIS, Marcy Siren   Fri Dec 28, 2022  8:04 AM) No fill hx found. Pt is adamant she is taking this medication.   LORazepam (ATIVAN) 0.5 MG tablet [id number] No Take 1 tablet (0.5 mg total) by mouth every 8 (eight) hours.  Patient taking differently: Take 0.5 mg by mouth as needed for anxiety.   Sandford Craze, NP Taking Active Self, Pharmacy Records  Melatonin 10 MG TABS [id number] No Take 10 mg by mouth at bedtime as needed (sleep). [provider] Taking Active Self, Pharmacy Records  ondansetron (ZOFRAN-ODT) 4 MG disintegrating tablet [id number] No Take 1 tablet (4 mg total) by mouth every 8 (eight) hours as needed for nausea or vomiting.  Patient taking differently: Take 4 mg by mouth as needed for nausea or vomiting.   Sandford Craze, NP Taking Active Self, Pharmacy Records  promethazine (PHENERGAN) 12.5 MG tablet [id number] No Take 1 tablet (12.5 mg total) by mouth every 8 (eight) hours as needed for nausea or vomiting.  Patient taking differently: Take 12.5 mg by mouth as needed for nausea or vomiting.   Eulis Foster, FNP Taking Active  Self, Pharmacy Records  sertraline (ZOLOFT) 100 MG tablet [id number]  Take 1.5 tablets (150 mg total) by mouth daily. Sandford Craze, NP  Active   traZODone (DESYREL) 100 MG tablet [id number]  Take 1 tablet (100 mg total) by mouth at bedtime as needed for sleep. Sandford Craze, NP  Active             Home Care and  Equipment/Supplies: Were Home Health Services Ordered?: NA Any new equipment or medical supplies ordered?: NA  Functional Questionnaire: Do you need assistance with bathing/showering or dressing?: No Do you need assistance with meal preparation?: No Do you need assistance with eating?: No Do you have difficulty maintaining continence: No Do you need assistance with getting out of bed/getting out of a chair/moving?: No Do you have difficulty managing or taking your medications?: No  Follow up appointments reviewed: PCP Follow-up appointment confirmed?: NA Specialist Hospital Follow-up appointment confirmed?: Yes Date of Specialist follow-up appointment?: 03/29/23 Follow-Up Specialty Provider:: Neuro Do you need transportation to your follow-up appointment?: No Do you understand care options if your condition(s) worsen?: Yes-patient verbalized understanding    SIGNATURE Karena Addison, LPN [facility] Nurse Health Advisor Direct Dial [phone]

## 2023-03-01 ENCOUNTER — Other Ambulatory Visit: Payer: Self-pay | Admitting: Family

## 2023-03-01 NOTE — Telephone Encounter (Signed)
Requesting: lorazepam 0.5mg   Contract:  04/24/22 UDS: 12/28/22 at hospital  Last Visit: 01/04/23 Next Visit: 04/08/23 Last Refill: 10/06/22 #30 and 0RF   Please Advise

## 2023-03-08 ENCOUNTER — Other Ambulatory Visit: Payer: Self-pay

## 2023-03-08 DIAGNOSIS — K769 Liver disease, unspecified: Secondary | ICD-10-CM

## 2023-03-14 ENCOUNTER — Encounter (INDEPENDENT_AMBULATORY_CARE_PROVIDER_SITE_OTHER): Payer: Self-pay

## 2023-03-24 ENCOUNTER — Ambulatory Visit (HOSPITAL_BASED_OUTPATIENT_CLINIC_OR_DEPARTMENT_OTHER)
Admission: RE | Admit: 2023-03-24 | Discharge: 2023-03-24 | Disposition: A | Discharge status: Home / Self Care | Payer: Medicaid Other | Source: Ambulatory Visit | Attending: Nurse Practitioner | Admitting: Nurse Practitioner

## 2023-03-24 DIAGNOSIS — K769 Liver disease, unspecified: Secondary | ICD-10-CM | POA: Insufficient documentation

## 2023-03-24 MED ORDER — GADOBUTROL 1 MMOL/ML IV SOLN
4.3000 mL | Freq: Once | INTRAVENOUS | Status: AC | PRN
  Administered 2023-03-24: 4.3 mL via INTRAVENOUS

## 2023-03-25 ENCOUNTER — Telehealth: Payer: Self-pay | Admitting: Psychiatry

## 2023-03-25 NOTE — Telephone Encounter (Signed)
LVM and sent mychart msg informing pt of need to reschedule 03/29/23 appt - MD out

## 2023-03-27 ENCOUNTER — Ambulatory Visit: Payer: Medicaid Other | Admitting: Neurology

## 2023-03-27 ENCOUNTER — Encounter: Payer: Self-pay | Admitting: Neurology

## 2023-03-27 VITALS — BP 94/58 | HR 88 | Resp 15 | Ht 64.0 in | Wt 99.4 lb

## 2023-03-27 DIAGNOSIS — G43709 Chronic migraine without aura, not intractable, without status migrainosus: Secondary | ICD-10-CM | POA: Diagnosis not present

## 2023-03-27 DIAGNOSIS — F418 Other specified anxiety disorders: Secondary | ICD-10-CM | POA: Diagnosis not present

## 2023-03-27 DIAGNOSIS — R2990 Unspecified symptoms and signs involving the nervous system: Secondary | ICD-10-CM

## 2023-03-27 MED ORDER — AIMOVIG 70 MG/ML ~~LOC~~ SOAJ
70.0000 mg | SUBCUTANEOUS | 11 refills | Status: DC
Start: 2023-03-27 — End: 2023-10-10

## 2023-03-27 MED ORDER — NURTEC 75 MG PO TBDP: ORAL_TABLET | ORAL | 11 refills | Status: DC

## 2023-03-27 MED ORDER — CYCLOBENZAPRINE HCL 10 MG PO TABS: 10.0000 mg | ORAL_TABLET | ORAL | 3 refills | Status: DC | PRN

## 2023-03-27 NOTE — Patient Instructions (Signed)
Meds ordered this encounter  Medications   Erenumab-aooe (AIMOVIG) 70 MG/ML SOAJ    Sig: Inject 70 mg into the skin every 30 (thirty) days.    Dispense:  1.12 mL    Refill:  11  1 Rimegepant Sulfate (NURTEC) 75 MG TBDP    Sig: Take 1 tab at onset of migraine.  May repeat in 2 hrs, if needed.  Max dose: 2 tabs/day. This is a 30 day prescription.    Dispense:  12 tablet    Refill:  11  2 cyclobenzaprine (FLEXERIL) 10 MG tablet    Sig: Take 1 tablet (10 mg total) by mouth every three (3) days as needed for muscle spasms.    Dispense:  60 tablet    Refill:  3    3. Benadryl 4. aleve

## 2023-03-27 NOTE — Progress Notes (Signed)
Chief Complaint  Patient presents with   New Patient (Initial Visit)    RM17, HUSBAND PRESENT (TIM) complicated migraine:20 in the past 30 days. 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 I looked and last ldl was 118 and since she mentioned plaque build up, she may be a good candidate for repatha based on her insurance ascvd rrisk should be below threshold of 70      ASSESSMENT AND PLAN  Misty Beck is a 52 y.o. female   Chronic migraine headache  With evidence of medicine rebound headache with over-the-counter Excedrin Migraine use  Aimovig as preventive medication  Nurtec as needed Multiple recurrent strokelike symptoms, received IV thrombolytic treatment 4 times without positive MRI findings, most recent 1 was at [facility] on February 25, 2023, [facility] in May 2024,  She has mild vascular risk factor, long history of depression anxiety, I do not think her reported focal neurological symptoms truly represent TIA, she should be evaluated in person rather than telestroke medicine to make the decision about IV thrombolytic treatment in the future  EEG for her recurrent stereotypical spells  DIAGNOSTIC DATA (LABS, IMAGING, TESTING) - I reviewed patient records, labs, notes, testing and imaging myself where available.   MEDICAL HISTORY:  Misty Beck is a 52 year old female, accompanied by her husband, seen in request by her primary care nurse practitioner Sandford Craze for evaluation of strokelike symptoms, initial evaluation March 27, 2023,  I reviewed and summarized the referring note. PMHX Depression, Anxiety Irritable bowel syndrome Vit B12 deificiency Chronic insomnia HLD  Hospital admission Dec 27, 2022, seen by stroke neurologist Dr. Roda Shutters, presenting with sudden onset left-sided weakness, numbness, facial drooping, telestroke assessment was performed, even with the constraining of the camera examination, there was some  questionable inconsistent on exam, she ended up receiving IV thrombolysis, then later transferred to [facility] for post thrombolytic care  Left-sided symptoms last about 1 day gradually recovered MRI of the brain was normal CT angiogram head and neck showed no large vessel disease  Laboratory showed A1c 5.1, LDL 47, normal CMP, CBC showed hemoglobin of 10.5  She had 2 similar occasions in 2021 and 2022, per patient also received IV thrombolytic treatment with no residual deficit  She again presenting to [facility] on February 25, 2023 for acute onset left-sided weakness, dysarthria, evaluated by telestroke, received TNK, with total resolve  of symptoms  MRI of the brain was normal  CT angiogram and perfusion study showed no perfusion abnormality, no intracranial vessel disease  Hemoglobin was 12.2  She complains of frequent headaches for many years, getting worse since 2023, 3 headaches each week, light noise sensitivity, taking almost daily Excedrin Migraine over past 2 years,    PHYSICAL EXAM:   Vitals:   03/27/23 1445  BP: (!) 94/58  Pulse: 88  Resp: 15  Weight: 99 lb 6.4 oz (45.1 kg)  Height: 5\' 4"  (1.626 m)      Body mass index is 17.06 kg/m.  PHYSICAL EXAMNIATION:  Gen: NAD, conversant, well nourised, well groomed                     Cardiovascular: Regular rate rhythm, no peripheral edema, warm, nontender. Eyes: Conjunctivae clear without exudates or hemorrhage Neck: Supple, no carotid bruits. Pulmonary: Clear to auscultation bilaterally   NEUROLOGICAL EXAM:  MENTAL STATUS: Speech/cognition: Awake, alert, oriented to history taking and casual conversation CRANIAL NERVES: CN II: Visual  fields are full to confrontation. Pupils are round equal and briskly reactive to light. CN III, IV, VI: extraocular movement are normal. No ptosis. CN V: Facial sensation is intact to light touch CN VII: Face is symmetric with normal eye closure  CN VIII: Hearing is normal to  causal conversation. CN IX, X: Phonation is normal. CN XI: Head turning and shoulder shrug are intact  MOTOR: There is no pronator drift of out-stretched arms. Muscle bulk and tone are normal. Muscle strength is normal.  REFLEXES: Reflexes are 2+ and symmetric at the biceps, triceps, knees, and ankles. Plantar responses are flexor.  SENSORY: Intact to light touch, pinprick and vibratory sensation are intact in fingers and toes.  COORDINATION: There is no trunk or limb dysmetria noted.  GAIT/STANCE: Posture is normal. Gait is steady with normal steps, base, arm swing, and turning. Heel and toe walking are normal. Tandem gait is normal.  Romberg is absent.  REVIEW OF SYSTEMS:  Full 14 system review of systems performed and notable only for as above All other review of systems were negative.   ALLERGIES: Allergies  Allergen Reactions   Doxycycline Other (See Comments)    Burning skin     HOME MEDICATIONS: Current Outpatient Medications  Medication Sig Dispense Refill   aspirin EC 325 MG tablet Take 1 tablet (325 mg total) by mouth daily.     aspirin-acetaminophen-caffeine (EXCEDRIN MIGRAINE) 250-250-65 MG tablet Take 2 tablets by mouth 3 (three) times daily as needed for headache or migraine.     atorvastatin (LIPITOR) 40 MG tablet Take 1 tablet (40 mg total) by mouth daily. 30 tablet 2   Cyanocobalamin (VITAMIN B-12 PO) Take 1 capsule by mouth 3 (three) times a week.     cyanocobalamin (VITAMIN B12) 1000 MCG/ML injection Inject 1 mL (1,000 mcg total) into the muscle once a week. 10 mL 0   diclofenac Sodium (VOLTAREN) 1 % GEL APPLY 2 GRAMS TOPICALLY 4 TIMES DAILY (Patient taking differently: Apply 1 Application topically as needed (pain).) 400 g 1   Eluxadoline (VIBERZI) 100 MG TABS Take 1 tablet (100 mg total) by mouth 2 (two) times daily. 180 tablet 0   LORazepam (ATIVAN) 0.5 MG tablet Take 1 tablet (0.5 mg total) by mouth every 8 (eight) hours as needed for anxiety. 30  tablet 0   Melatonin 10 MG TABS Take 10 mg by mouth at bedtime as needed (sleep).     ondansetron (ZOFRAN-ODT) 4 MG disintegrating tablet Take 1 tablet (4 mg total) by mouth every 8 (eight) hours as needed for nausea or vomiting. (Patient taking differently: Take 4 mg by mouth as needed for nausea or vomiting.) 30 tablet 0   promethazine (PHENERGAN) 12.5 MG tablet Take 1 tablet (12.5 mg total) by mouth every 8 (eight) hours as needed for nausea or vomiting. (Patient taking differently: Take 12.5 mg by mouth as needed for nausea or vomiting.) 20 tablet 0   sertraline (ZOLOFT) 100 MG tablet Take 1.5 tablets (150 mg total) by mouth daily. 135 tablet 1   traZODone (DESYREL) 100 MG tablet Take 1 tablet (100 mg total) by mouth at bedtime as needed for sleep. 90 tablet 0   No current facility-administered medications for this visit.    PAST MEDICAL HISTORY: Past Medical History:  Diagnosis Date   Asthma    Depression with anxiety 04/20/2021   Referral source Dr Amada Kingfisher bonsu   Diarrhea    referral source   Hyperlipidemia    referral source  Insomnia 04/20/2021   referral source Dr Christin Fudge   Received intravenous tissue plasminogen activator (tPA) in emergency department 10/24/2021   Stroke [facility])    Dec, 2021, Jan 2022    PAST SURGICAL HISTORY: Past Surgical History:  Procedure Laterality Date   APPENDECTOMY  1992   left shoulder surgery     referral source Dr Bari Mantis   PARTIAL HYSTERECTOMY     2009    FAMILY HISTORY: Family History  Problem Relation Age of Onset   Stroke Mother    Cancer Mother    Colon polyps Mother    Skin cancer Mother    Breast cancer Mother 71   Kidney disease Father    Hyperlipidemia Father    COPD Father    Heart disease Father    Colon cancer Neg Hx    Rectal cancer Neg Hx    Stomach cancer Neg Hx     SOCIAL HISTORY: Social History   Socioeconomic History   Marital status: Divorced    Spouse name: Not on file   Number of  children: 2   Years of education: Not on file   Highest education level: Not on file  Occupational History   Not on file  Tobacco Use   Smoking status: Former    Types: Cigarettes   Smokeless tobacco: Never  Substance and Sexual Activity   Alcohol use: Not Currently   Drug use: Not Currently   Sexual activity: Not on file  Other Topics Concern   Not on file  Social History Narrative   Not on file   Social Determinants of Health   Financial Resource Strain: Not on file  Food Insecurity: Low Risk  (02/25/2023)   Received from [facility]   Food vital sign    Within the past 12 months, you worried that your food would run out before you got money to buy more: Never true    Within the past 12 months, the food you bought just didn't last and you didn't have money to get more. : Never true  Transportation Needs: Not on file (02/25/2023)  Physical Activity: Not on file  Stress: Not on file  Social Connections: Not on file  Intimate Partner Violence: Not on file      Levert Feinstein, M.D. Ph.D.  [facility] [address], [address] [place], [place] [address] Ph: [phone] Fax: [phone]  CC:  Marvel Plan, MD [address] [place],  [place] [address]  Sandford Craze, NP

## 2023-03-28 ENCOUNTER — Other Ambulatory Visit: Payer: Self-pay | Admitting: Family

## 2023-03-28 DIAGNOSIS — E538 Deficiency of other specified B group vitamins: Secondary | ICD-10-CM

## 2023-03-29 ENCOUNTER — Ambulatory Visit: Payer: Medicaid Other | Admitting: Psychiatry

## 2023-04-02 ENCOUNTER — Telehealth: Payer: Self-pay | Admitting: Neurology

## 2023-04-02 NOTE — Telephone Encounter (Signed)
Unable to reach pt over the phone, VM box full. Sent mychart msg informing pt of need to reschedule 04/03/23 EEG - Tresa Endo out

## 2023-04-03 ENCOUNTER — Other Ambulatory Visit: Payer: Medicaid Other | Admitting: *Deleted

## 2023-04-03 ENCOUNTER — Telehealth: Payer: Self-pay | Admitting: *Deleted

## 2023-04-03 ENCOUNTER — Other Ambulatory Visit: Payer: Self-pay | Admitting: *Deleted

## 2023-04-03 ENCOUNTER — Telehealth: Payer: Self-pay | Admitting: Family

## 2023-04-03 DIAGNOSIS — R634 Abnormal weight loss: Secondary | ICD-10-CM

## 2023-04-03 DIAGNOSIS — R1030 Lower abdominal pain, unspecified: Secondary | ICD-10-CM

## 2023-04-03 DIAGNOSIS — E278 Other specified disorders of adrenal gland: Secondary | ICD-10-CM

## 2023-04-03 DIAGNOSIS — K529 Noninfective gastroenteritis and colitis, unspecified: Secondary | ICD-10-CM

## 2023-04-03 HISTORY — DX: Other specified disorders of adrenal gland: E27.8

## 2023-04-03 NOTE — Telephone Encounter (Signed)
Called patient to inform of results and recommendations per Willette Cluster, PA and Dr. Barron Alvine. Information read to patient and patient is happy to have received: MRI looks great with normal appearing liver, without any duct dilation. No masses or other abnormalities. Normal-appearing pancreas and spleen, along with normal visualized  GI tract. No dedicated hepatic imaging needed.

## 2023-04-03 NOTE — Telephone Encounter (Signed)
-----   Message from Willette Cluster sent at 04/03/2023  1:29 PM EDT ----- Elvina Sidle, these are his comments  March 29, 2023 Shellia Cleverly, DO  to Me     03/29/23  5:14 PM Agree, the MRI looks great.  Normal-appearing liver without any duct dilation.  No lesions, masses, or other abnormalities.  Normal-appearing pancreas and spleen, along with normal visualized GI tract.  No repeat dedicated hepatic imaging needed.  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  Can follow-up those results with your [facility] with consideration of repeat imaging in 1 year.  Overall great news.

## 2023-04-03 NOTE — Telephone Encounter (Signed)
This encounter has already been addressed

## 2023-04-03 NOTE — Telephone Encounter (Signed)
-----   Message from Willette Cluster sent at 04/02/2023  6:12 PM EDT ----- Alona Bene,  I am pasting below Dr. Frankey Shown comments about her MRI.  If you would please call her and let her know his thoughts.  Thanks

## 2023-04-03 NOTE — Telephone Encounter (Signed)
Called and spoke to patient to clarify that her PCP will advise on repeat imaging as recommended by radiology ( adrenal gland abnormality). MRI report sent to PCP via Epic.

## 2023-04-03 NOTE — Telephone Encounter (Signed)
See mychart.  

## 2023-04-05 ENCOUNTER — Other Ambulatory Visit: Payer: Self-pay

## 2023-04-05 NOTE — Patient Outreach (Signed)
Telephone outreach to patient to obtain mRS was successfully completed. MRS=2  Misty Beck [facility] Care Management Assistant [phone]  

## 2023-04-08 ENCOUNTER — Telehealth: Payer: Self-pay | Admitting: Family

## 2023-04-08 ENCOUNTER — Ambulatory Visit (INDEPENDENT_AMBULATORY_CARE_PROVIDER_SITE_OTHER): Payer: Medicaid Other | Admitting: Family

## 2023-04-08 ENCOUNTER — Other Ambulatory Visit: Payer: Self-pay

## 2023-04-08 ENCOUNTER — Ambulatory Visit (HOSPITAL_BASED_OUTPATIENT_CLINIC_OR_DEPARTMENT_OTHER)
Admission: RE | Admit: 2023-04-08 | Discharge: 2023-04-08 | Disposition: A | Discharge status: Home / Self Care | Payer: Medicaid Other | Source: Ambulatory Visit | Attending: Family | Admitting: Family

## 2023-04-08 VITALS — BP 103/66 | HR 81 | Temp 98.2°F | Resp 16 | Ht 61.0 in | Wt 100.3 lb

## 2023-04-08 DIAGNOSIS — G8929 Other chronic pain: Secondary | ICD-10-CM

## 2023-04-08 DIAGNOSIS — F411 Generalized anxiety disorder: Secondary | ICD-10-CM | POA: Diagnosis not present

## 2023-04-08 DIAGNOSIS — R232 Flushing: Secondary | ICD-10-CM

## 2023-04-08 DIAGNOSIS — M545 Low back pain, unspecified: Secondary | ICD-10-CM | POA: Insufficient documentation

## 2023-04-08 DIAGNOSIS — E538 Deficiency of other specified B group vitamins: Secondary | ICD-10-CM

## 2023-04-08 DIAGNOSIS — Z8673 Personal history of transient ischemic attack (TIA), and cerebral infarction without residual deficits: Secondary | ICD-10-CM

## 2023-04-08 HISTORY — DX: Other chronic pain: G89.29

## 2023-04-08 MED ORDER — MELOXICAM 7.5 MG PO TABS
7.5000 mg | ORAL_TABLET | Freq: Every day | ORAL | 2 refills | Status: DC | PRN
Start: 2023-04-08 — End: 2023-06-14

## 2023-04-08 MED ORDER — MELOXICAM 7.5 MG PO TABS
30.0000 mg | ORAL_TABLET | Freq: Every day | ORAL | 2 refills | Status: DC | PRN
Start: 2023-04-08 — End: 2023-04-08

## 2023-04-08 MED ORDER — GABAPENTIN 100 MG PO CAPS: 100.0000 mg | ORAL_CAPSULE | Freq: Three times a day (TID) | ORAL | Status: DC

## 2023-04-08 NOTE — Patient Instructions (Signed)
VISIT SUMMARY:  During your recent visit, we discussed your ongoing concerns related to stroke-like symptoms, migraines, chronic back pain, hot flashes, nausea, anxiety, and Vitamin B12 deficiency. We also reviewed your current medications and made some adjustments to better manage your symptoms.  YOUR PLAN:  -STROKE-LIKE SYMPTOMS: These are symptoms that mimic a stroke, such as facial numbness and double vision. We will continue to monitor these symptoms with your neurologist, Dr. Terrace Arabia.  -MIGRAINES: These are severe headaches that can cause other symptoms like nausea. We are waiting for insurance approval for new medications, Nurtec and Aimovig, to help manage these.  -CHRONIC BACK PAIN: This is long-term pain in your back, likely due to a difference in leg length. We have ordered an x-ray and referred you to physical therapy. We also prescribed Meloxicam for pain relief.  -HOT FLASHES: These are sudden feelings of warmth, which are often experienced during menopause. We recommend retrying Gabapentin to help manage these symptoms.  -NAUSEA: This is a feeling of sickness with an inclination to vomit. Continue your current anti-nausea medication as needed.  -ANXIETY: This is a feeling of worry or fear. Continue taking Lorazepam as needed to manage this.  -VITAMIN B12 DEFICIENCY: This is a condition where your body does not have enough of the vitamin B12. Continue your current regimen of biweekly B12 injections and daily oral supplements.  INSTRUCTIONS:  Please follow the new treatment plans discussed during your visit. Contact Dr. Zannie Cove office regarding insurance approval for [employer]	[place] and Aimovig. Take the prescribed Meloxicam as needed for back pain and retry Gabapentin for hot flashes. Continue your current medications for nausea, anxiety, and Vitamin B12 deficiency. We will follow up in three months to assess your response to these interventions.

## 2023-04-08 NOTE — Assessment & Plan Note (Addendum)
Stable on q2 week injections + daily oral supplement.

## 2023-04-08 NOTE — Telephone Encounter (Signed)
Rx sent with correct dosing, per provider 7.5 once a day.  Note to pharmacy to disregard previous rx

## 2023-04-08 NOTE — Assessment & Plan Note (Signed)
  Longstanding back pain, likely due to leg length discrepancy. Pain is not relieved by ibuprofen or gabapentin. Patient reports worsening pain, limiting daily activities. -Order x-ray of lower back. -Refer to physical therapy for strengthening exercises and potential dry needling. -Prescribe Meloxicam as needed for pain.

## 2023-04-08 NOTE — Assessment & Plan Note (Signed)
Uncontrolled, retrial gabapentin 100mg  tid.

## 2023-04-08 NOTE — Telephone Encounter (Signed)
[facility] pharmacist called and wanted to verify if Efraim Kaufmann wants the pt to take 4 tablets of the meloxicam (MOBIC) 7.5 MG tablet per day. She stated that the max dose per day is 15 mg. Please confirm with Karin Golden if this is correct.

## 2023-04-08 NOTE — Assessment & Plan Note (Addendum)
Stable on zoloft 150mg .  Occasional anxiety, managed with Lorazepam approximately once per week. -Continue Zoloft and Lorazepam as needed.

## 2023-04-08 NOTE — Assessment & Plan Note (Signed)
Recent hospitalization for stroke-like symptoms, with residual left-sided facial numbness and double vision in the left eye. Neurologist suspects migraines as the cause, but the patient reports only occasional headaches preceding these episodes. -Continue follow-up with neurologist Dr. Terrace Arabia.

## 2023-04-08 NOTE — Progress Notes (Signed)
Subjective:     Patient ID: Misty Beck, female    DOB: [birth year], 52 y.o.   MRN: [id number]  No chief complaint on file.   HPI  Discussed the use of AI scribe software for clinical note transcription with the patient, who gave verbal consent to proceed.  History of Present Illness   The patient, with a history of stroke-like symptoms (recent hospitalization this summer) and migraines, presents with ongoing concerns. The patient was admitted to [facility] in July for stroke-like symptoms. Per patient, neurologist's best guess is that her symptoms are related to migraines even though she doesn't always have headache.  However, the patient reports that it's only been once or twice that she's had a headache before the "stroke-like" symptoms occurred. The patient also reports permanent double vision in the left eye and a lack of feeling in the muscles on the left side of the face, which the neurologist attributes to a stroke.  The patient also presents with persistent back pain, which has been present since the age of 7. The pain is mostly on the left side but affects both sides. The patient reports that the pain is not alleviated by ibuprofen or gabapentin. The patient has tried a lift for the uneven legs, but it made the pain worse. The patient also reports sharp, constant pain in the left leg for the past three days.  The patient is currently taking sertraline 150 for mood and anxiety, which is reported to be effective. The patient also reports frequent hot flashes, day and night. The patient is taking B12 injections every two weeks and B12 tablets for energy. The patient reports a significant increase in appetite and weight. The patient also reports occasional nausea, particularly with migraines, and uses lorazepam about once a week for stress.         Health Maintenance Due  Topic Date Due   Hepatitis C Screening  Never done   DTaP/Tdap/Td (1 - Tdap) Never done   Zoster  Vaccines- Shingrix (1 of 2) Never done   COVID-19 Vaccine (3 - Pfizer risk series) 04/27/2020   INFLUENZA VACCINE  Never done    Past Medical History:  Diagnosis Date   Asthma    Depression with anxiety 04/20/2021   Referral source Dr Amada Kingfisher bonsu   Diarrhea    referral source   Hyperlipidemia    referral source   Insomnia 04/20/2021   referral source Dr Christin Fudge   Received intravenous tissue plasminogen activator (tPA) in emergency department 10/24/2021   Stroke [facility])    Dec, 2021, Jan 2022    Past Surgical History:  Procedure Laterality Date   APPENDECTOMY  1992   left shoulder surgery     referral source Dr Bari Mantis   PARTIAL HYSTERECTOMY     2009    Family History  Problem Relation Age of Onset   Stroke Mother    Cancer Mother    Colon polyps Mother    Skin cancer Mother    Breast cancer Mother 31   Kidney disease Father    Hyperlipidemia Father    COPD Father    Heart disease Father    Colon cancer Neg Hx    Rectal cancer Neg Hx    Stomach cancer Neg Hx     Social History   Socioeconomic History   Marital status: Divorced    Spouse name: Not on file   Number of children: 2   Years of education:  Not on file   Highest education level: Not on file  Occupational History   Not on file  Tobacco Use   Smoking status: Former    Types: Cigarettes   Smokeless tobacco: Never  Substance and Sexual Activity   Alcohol use: Not Currently   Drug use: Not Currently   Sexual activity: Not on file  Other Topics Concern   Not on file  Social History Narrative   Not on file   Social Determinants of Health   Financial Resource Strain: Not on file  Food Insecurity: Low Risk  (02/25/2023)   Received from [facility]   Hunger Vital Sign    Worried About Running Out of Food in the Last Year: Never true    Ran Out of Food in the Last Year: Never true  Transportation Needs: Not on file (02/25/2023)  Physical Activity: Not on file  Stress: Not on file   Social Connections: Not on file  Intimate Partner Violence: Not on file    Outpatient Medications Prior to Visit  Medication Sig Dispense Refill   aspirin EC 325 MG tablet Take 1 tablet (325 mg total) by mouth daily.     aspirin-acetaminophen-caffeine (EXCEDRIN MIGRAINE) 250-250-65 MG tablet Take 2 tablets by mouth 3 (three) times daily as needed for headache or migraine.     atorvastatin (LIPITOR) 40 MG tablet Take 1 tablet (40 mg total) by mouth daily. 30 tablet 2   Cyanocobalamin (VITAMIN B-12 PO) Take 1 capsule by mouth 3 (three) times a week.     cyanocobalamin (VITAMIN B12) 1000 MCG/ML injection Inject 1 mL (1,000 mcg total) into the muscle every 30 (thirty) days. 4 mL 4   cyclobenzaprine (FLEXERIL) 10 MG tablet Take 1 tablet (10 mg total) by mouth every three (3) days as needed for muscle spasms. 60 tablet 3   diclofenac Sodium (VOLTAREN) 1 % GEL APPLY 2 GRAMS TOPICALLY 4 TIMES DAILY (Patient taking differently: Apply 1 Application topically as needed (pain).) 400 g 1   Eluxadoline (VIBERZI) 100 MG TABS Take 1 tablet (100 mg total) by mouth 2 (two) times daily. 180 tablet 0   Erenumab-aooe (AIMOVIG) 70 MG/ML SOAJ Inject 70 mg into the skin every 30 (thirty) days. 1.12 mL 11   LORazepam (ATIVAN) 0.5 MG tablet Take 1 tablet (0.5 mg total) by mouth every 8 (eight) hours as needed for anxiety. 30 tablet 0   Melatonin 10 MG TABS Take 10 mg by mouth at bedtime as needed (sleep).     ondansetron (ZOFRAN-ODT) 4 MG disintegrating tablet Take 1 tablet (4 mg total) by mouth every 8 (eight) hours as needed for nausea or vomiting. (Patient taking differently: Take 4 mg by mouth as needed for nausea or vomiting.) 30 tablet 0   promethazine (PHENERGAN) 12.5 MG tablet Take 1 tablet (12.5 mg total) by mouth every 8 (eight) hours as needed for nausea or vomiting. (Patient taking differently: Take 12.5 mg by mouth as needed for nausea or vomiting.) 20 tablet 0   Rimegepant Sulfate (NURTEC) 75 MG TBDP Take 1  tab at onset of migraine.  May repeat in 2 hrs, if needed.  Max dose: 2 tabs/day. This is a 30 day prescription. 12 tablet 11   sertraline (ZOLOFT) 100 MG tablet Take 1.5 tablets (150 mg total) by mouth daily. 135 tablet 1   traZODone (DESYREL) 100 MG tablet Take 1 tablet (100 mg total) by mouth at bedtime as needed for sleep. 90 tablet 0   No facility-administered medications  prior to visit.    Allergies  Allergen Reactions   Doxycycline Other (See Comments)    Burning skin     ROS    See HPI Objective:    Physical Exam Constitutional:      General: She is not in acute distress.    Appearance: Normal appearance. She is well-developed.  HENT:     Head: Normocephalic and atraumatic.     Right Ear: External ear normal.     Left Ear: External ear normal.  Eyes:     General: No scleral icterus. Neck:     Thyroid: No thyromegaly.  Cardiovascular:     Rate and Rhythm: Normal rate and regular rhythm.     Heart sounds: Normal heart sounds. No murmur heard. Pulmonary:     Effort: Pulmonary effort is normal. No respiratory distress.     Breath sounds: Normal breath sounds. No wheezing.  Musculoskeletal:     Cervical back: Neck supple.  Skin:    General: Skin is warm and dry.  Neurological:     Mental Status: She is alert and oriented to person, place, and time.  Psychiatric:        Mood and Affect: Mood normal.        Behavior: Behavior normal.        Thought Content: Thought content normal.        Judgment: Judgment normal.      BP 103/66 (BP Location: Right Arm, Patient Position: Sitting, Cuff Size: Normal)   Pulse 81   Temp 98.2 F (36.8 C) (Oral)   Resp 16   Ht 5\' 1"  (1.549 m)   Wt 100 lb 4.8 oz (45.5 kg)   SpO2 100%   BMI 18.95 kg/m  Wt Readings from Last 3 Encounters:  04/08/23 100 lb 4.8 oz (45.5 kg)  03/27/23 99 lb 6.4 oz (45.1 kg)  01/04/23 94 lb 12.8 oz (43 kg)       Assessment & Plan:   Problem List Items Addressed This Visit        Unprioritized   Hot flashes    Uncontrolled, retrial gabapentin 100mg  tid.      History of CVA (cerebrovascular accident)    Recent hospitalization for stroke-like symptoms, with residual left-sided facial numbness and double vision in the left eye. Neurologist suspects migraines as the cause, but the patient reports only occasional headaches preceding these episodes. -Continue follow-up with neurologist Dr. Terrace Arabia.      Chronic bilateral low back pain without sciatica     Longstanding back pain, likely due to leg length discrepancy. Pain is not relieved by ibuprofen or gabapentin. Patient reports worsening pain, limiting daily activities. -Order x-ray of lower back. -Refer to physical therapy for strengthening exercises and potential dry needling. -Prescribe Meloxicam as needed for pain.      Relevant Medications   gabapentin (NEURONTIN) 100 MG capsule   Other Relevant Orders   DG Lumbar Spine Complete   Ambulatory referral to Physical Therapy   B12 deficiency    Stable on q2 week injections + daily oral supplement.       Anxiety state - Primary    Stable on zoloft 150mg .  Occasional anxiety, managed with Lorazepam approximately once per week. -Continue Zoloft and Lorazepam as needed.       I am having Letha Cape start on gabapentin. I am also having her maintain her diclofenac Sodium, ondansetron, aspirin EC, Viberzi, promethazine, Melatonin, Cyanocobalamin (VITAMIN B-12 PO), aspirin-acetaminophen-caffeine, atorvastatin, sertraline, traZODone, LORazepam,  Aimovig, Nurtec, cyclobenzaprine, and cyanocobalamin.  Meds ordered this encounter  Medications   gabapentin (NEURONTIN) 100 MG capsule    Sig: Take 1 capsule (100 mg total) by mouth 3 (three) times daily.    Order Specific Question:   Supervising Provider    Answer:   Danise Edge A [[id number]]   DISCONTD: meloxicam (MOBIC) 7.5 MG tablet    Sig: Take 4 tablets (30 mg total) by mouth daily as needed for pain.    Dispense:   30 tablet    Refill:  2    Order Specific Question:   Supervising Provider    Answer:   Danise Edge A [id number]

## 2023-04-10 ENCOUNTER — Telehealth: Payer: Self-pay

## 2023-04-10 DIAGNOSIS — G43709 Chronic migraine without aura, not intractable, without status migrainosus: Secondary | ICD-10-CM

## 2023-04-10 NOTE — Telephone Encounter (Signed)
*  [facility]  Pharmacy Patient Advocate Encounter   Received notification from CoverMyMeds that prior authorization for Nurtec 75MG  dispersible tablets  is required/requested.   Insurance verification completed.   The patient is insured through [facility] .   Per test claim: PA required; PA submitted to [facility] via CoverMyMeds Key/confirmation #/[id number] Status is pending

## 2023-04-10 NOTE — Telephone Encounter (Signed)
*  [facility]  Pharmacy Patient Advocate Encounter   Received notification from CoverMyMeds that prior authorization for Aimovig 70MG /ML auto-injectors  is required/requested.   Insurance verification completed.   The patient is insured through [facility] .   Per test claim: PA required; PA submitted to [facility] via CoverMyMeds Key/confirmation #/EOC [facility] Status is pending

## 2023-04-16 ENCOUNTER — Other Ambulatory Visit: Payer: Medicaid Other | Admitting: *Deleted

## 2023-04-22 ENCOUNTER — Other Ambulatory Visit: Payer: Self-pay | Admitting: Family

## 2023-04-22 DIAGNOSIS — F411 Generalized anxiety disorder: Secondary | ICD-10-CM
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
## 2023-04-22 NOTE — Telephone Encounter (Signed)
Please advise pt that I sent her trazodone but she is due to update her contract and UDS.  I will be able to send the rx for ativan after I receive these results/contract.

## 2023-04-22 NOTE — Telephone Encounter (Signed)
Requesting: lorazepam 0.5mg   Contract: 04/16/22 UDS: 04/16/22 Last Visit: 04/08/23 Next Visit: 07/08/23 Last Refill: 03/01/23 #30 and 0RF   Please Advise

## 2023-04-23 ENCOUNTER — Other Ambulatory Visit: Payer: Self-pay

## 2023-04-23 ENCOUNTER — Ambulatory Visit: Payer: Medicaid Other | Attending: Family | Admitting: Physical Therapy

## 2023-04-23 DIAGNOSIS — M545 Low back pain, unspecified: Secondary | ICD-10-CM | POA: Insufficient documentation

## 2023-04-23 DIAGNOSIS — M6281 Muscle weakness (generalized): Secondary | ICD-10-CM | POA: Insufficient documentation

## 2023-04-23 DIAGNOSIS — M5459 Other low back pain: Secondary | ICD-10-CM | POA: Diagnosis present

## 2023-04-23 DIAGNOSIS — R252 Cramp and spasm: Secondary | ICD-10-CM | POA: Diagnosis present

## 2023-04-23 DIAGNOSIS — G8929 Other chronic pain: Secondary | ICD-10-CM | POA: Diagnosis not present

## 2023-04-24 NOTE — Telephone Encounter (Signed)
Patient notified of medication approved and medication denied. She will come 10/058 for UDS and to sign contract.

## 2023-05-07 ENCOUNTER — Other Ambulatory Visit: Payer: Medicaid Other

## 2023-05-07 ENCOUNTER — Ambulatory Visit: Payer: Medicaid Other | Attending: Family

## 2023-05-07 ENCOUNTER — Other Ambulatory Visit: Payer: Self-pay

## 2023-05-07 DIAGNOSIS — F411 Generalized anxiety disorder: Secondary | ICD-10-CM

## 2023-05-07 DIAGNOSIS — R252 Cramp and spasm: Secondary | ICD-10-CM | POA: Diagnosis present

## 2023-05-07 DIAGNOSIS — M5459 Other low back pain: Secondary | ICD-10-CM | POA: Diagnosis present

## 2023-05-07 DIAGNOSIS — M6281 Muscle weakness (generalized): Secondary | ICD-10-CM | POA: Diagnosis present
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
## 2023-05-08 LAB — DRUG MONITORING, PANEL 8 WITH CONFIRMATION, URINE
6 Acetylmorphine: NEGATIVE ng/mL (ref <10)
Alcohol Metabolites: NEGATIVE ng/mL (ref <500)
Amphetamines: NEGATIVE ng/mL (ref <500)
Benzodiazepines: NEGATIVE ng/mL (ref <100)
Buprenorphine, Urine: NEGATIVE ng/mL (ref <5)
Cocaine Metabolite: NEGATIVE ng/mL (ref <150)
Creatinine: 176.6 mg/dL (ref >=20.0)
MDMA: NEGATIVE ng/mL (ref <500)
Marijuana Metabolite: NEGATIVE ng/mL (ref <20)
Opiates: NEGATIVE ng/mL (ref <100)
Oxidant: NEGATIVE ug/mL (ref <200)
Oxycodone: NEGATIVE ng/mL (ref <100)
pH: 6 (ref 4.5–9.0)

## 2023-05-08 LAB — DM TEMPLATE

## 2023-05-09 ENCOUNTER — Ambulatory Visit: Payer: Medicaid Other

## 2023-05-09 DIAGNOSIS — M6281 Muscle weakness (generalized): Secondary | ICD-10-CM

## 2023-05-09 DIAGNOSIS — M5459 Other low back pain: Secondary | ICD-10-CM

## 2023-05-09 DIAGNOSIS — R252 Cramp and spasm: Secondary | ICD-10-CM

## 2023-05-09 NOTE — Therapy (Signed)
OUTPATIENT PHYSICAL THERAPY THORACOLUMBAR TREATMENT   Patient Name: Misty Beck MRN: [id number] DOB:07-11-1971, 52 y.o., female Today's Date: 05/09/2023  END OF SESSION:  PT End of Session - 05/09/23 1617     Visit Number 3    Authorization Type [employer]    PT Start Time 1536    PT Stop Time 1625    PT Time Calculation (min) 49 min    Activity Tolerance Patient tolerated treatment well    Behavior During Therapy [facility] for tasks assessed/performed              Past Medical History:  Diagnosis Date   Asthma    Depression with anxiety 04/20/2021   Referral source Dr Amada Kingfisher bonsu   Diarrhea    referral source   Hyperlipidemia    referral source   Insomnia 04/20/2021   referral source Dr Christin Fudge   Received intravenous tissue plasminogen activator (tPA) in emergency department 10/24/2021   Stroke [facility])    Dec, 2021, Jan 2022   Past Surgical History:  Procedure Laterality Date   APPENDECTOMY  1992   left shoulder surgery     referral source Dr Bari Mantis   PARTIAL HYSTERECTOMY     2009   Patient Active Problem List   Diagnosis Date Noted   Chronic bilateral low back pain without sciatica 04/08/2023   Adrenal mass (HCC) 04/03/2023   Chronic migraine w/o aura w/o status migrainosus, not intractable 03/27/2023   Complicated migraine 01/04/2023   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   Stroke-like symptoms 12/27/2022   B12 deficiency 07/10/2022   Cerebrovascular accident (CVA) (HCC) 06/12/2022   Atypical chest pain 06/12/2022   Leg length discrepancy 06/12/2022   Weight loss 06/12/2022   Chronic nausea 04/16/2022   Chronic diarrhea 04/16/2022   History of CVA (cerebrovascular accident) 04/16/2022   Hot flashes 04/16/2022   History of migraine headaches 09/22/2021   Insomnia 09/22/2021   Chronic left shoulder pain 06/15/2020   Bipolar I disorder, single manic episode (HCC) 12/12/2012   Depression with anxiety 12/12/2012   Anxiety state 10/14/2012     PCP: Sandford Craze, NP   REFERRING PROVIDER: Sandford Craze, NP   REFERRING DIAG: M54.50,G89.29 (ICD-10-CM) - Chronic bilateral low back pain without sciatica   Rationale for Evaluation and Treatment: Rehabilitation  THERAPY DIAG:  Other low back pain  Cramp and spasm  Muscle weakness (generalized)  ONSET DATE: chronic since age 55  SUBJECTIVE:  SUBJECTIVE STATEMENT: Pain is  bad today she put up all of her christmas decorations before she came in today. Reports some improvement from shoe cork.  PERTINENT HISTORY:  Chronic back pain, stroke symptoms, migraine, adrenal mass, Bipolar I, Depression and Anxiety, leg length discrepancy   PAIN:  Are you having pain? Yes: NPRS scale: 12/10 Pain location: Left side low back  Pain description: throbbing pain Aggravating factors: standing walking, prolonged sitting, laying down Relieving factors: vicodin  PRECAUTIONS: None  RED FLAGS: None   WEIGHT BEARING RESTRICTIONS: No  FALLS:  Has patient fallen in last 6 months? 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 Otherwise, alignment is normal. There are no acute fractures or subluxations. No lytic or blastic lesions.   IMPRESSION: Mild scoliosis.  No evidence for acute  abnormality.  PATIENT SURVEYS:  Modified Oswestry 33/50   COGNITION: Overall cognitive status: Within functional limits for tasks  assessed     SENSATION: Light touch: Impaired  along L L45 dermatome.   MUSCLE LENGTH: Hamstrings: Right ~ 60 deg; Left ~ 60 deg Quads (prone knee bend: Right - mild tightness; Left - significant tightness, increased pain in back   POSTURE: decreased lumbar lordosis and weight shift right  PALPATION: Tenderness L4/5, L SIJ, L lumbar paraspinals, QL, glutes, muscle atrophy L erector spinale  LUMBAR ROM:   AROM eval  Flexion To knees, P!  Extension Limited 90% p!  Right lateral flexion To knees, p!  Left lateral flexion Immediate p!  Right rotation 50%  Left rotation 50%   (Blank rows = not tested)  LOWER EXTREMITY ROM:   decreased R hip ER compared to L, but otherwise WNL   LOWER EXTREMITY MMT:    MMT Right eval Left eval  Hip flexion 4+ 4  Hip extension 4+ 4  Hip abduction 4+ 4  Hip adduction 4+ 4  Knee flexion    Knee extension 4+ 4+  Ankle dorsiflexion 5 5  Ankle plantarflexion 5 5   (Blank rows = not tested)  LUMBAR SPECIAL TESTS:  Straight leg raise test: Negative and FABER test: Positive left for back pain  FUNCTIONAL TESTS:  Gait speed 57m/s  GAIT: Distance walked: 50; Assistive device utilized: None Level of assistance: Complete Independence Comments: w/s right, slight scoliosis with L hip higher than R.   TODAY'S TREATMENT:                                                                                                                              DATE:  05/09/23 STM to B glutes and lumbar paraspinals Passive stretching of piriformis, glutes,  IR/ER stretches bil Supine for IFC, e stim, lumbar region , over moist heat x 12 min at end of session for pain management   05/07/23:  Therapeutic exercise:   Added 1/8" cork insert R shoe with resultant improved posture, less scoliosis noted and more symmetrical iliac crest height B Instructed in seated stabilization ex:  Determined to instruct in seated ex as patient on computer for several hrs at a time   Advised in isometric therex, educated that these ex are for improving stability for her lumbar spine, also may help some with breaking pain cycle, advised to vary the intensity of the isometric cxn according to her pain level and maintain 3 pts or less above her resting pain level for the ex.  See HEP below for details on ex.   Supine for IFC, e stim, lumbar region , over moist heat x 15 min at end of session for pain management   04/23/23 EVAL Self Care: findings, POC, initial HEP  Pain Neuroscience Education.  Patient educated on the concept of the nervous system as the bodies alarm system, and the role of nociception to warn the body of danger.  Peripheral nerve sensitization, hyperalgesia and allodynia were explained using metaphors to promote deep learning.  Patient educated on the concept of neuroplasticity and how factors such as temperature, stress, movement, immunity and blood flow affect pain via channel expression.    PATIENT EDUCATION:  Education details: see self care Person educated: Patient Education method: Explanation, Demonstration, Verbal cues, and Handouts Education comprehension: verbalized understanding and returned demonstration  HOME EXERCISE PROGRAM: Access Code: [email or web address] URL: [email or web address] Date: 04/23/2023 Prepared by: Harrie Foreman  Exercises - Prone Pelvic Floor Contraction With Pillow  - 1 x daily - 7 x weekly - 3-5 min  hold - Prone Knee Flexion  - 1 x daily - 7 x weekly - 1 sets - 10 reps  Patient Education - TENS UNIT - AUVON Dual Channel TENS Unit  ASSESSMENT:  CLINICAL IMPRESSION: Miguel Rota  arrived with increased lumbar pain today from working on decor at her home however reports improvement for shoe cork last visit. Addressed pain with MT for STM and stretching of LE's, pt unable to tolerate exercise today. Most tension found in R/L glutes today. Estim applied to lumbar spine post session to address pain with moist  heat.    OBJECTIVE IMPAIRMENTS: Abnormal gait, decreased activity tolerance, decreased endurance, decreased mobility, difficulty walking, decreased ROM, decreased strength, increased fascial restrictions, increased muscle spasms, impaired flexibility, and pain.   ACTIVITY LIMITATIONS: carrying, lifting, bending, sitting, standing, squatting, sleeping, stairs, and locomotion level  PARTICIPATION LIMITATIONS: meal prep, cleaning, laundry, driving, shopping, community activity, and occupation  PERSONAL FACTORS: Past/current experiences, Time since onset of injury/illness/exacerbation, and 1-2 comorbidities: Chronic back pain, stroke symptoms, migraine, adrenal mass, Bipolar I, Depression and Anxiety, leg length discrepancy  are also affecting patient's functional outcome.   REHAB POTENTIAL: Good  CLINICAL DECISION MAKING: Evolving/moderate complexity  EVALUATION COMPLEXITY: Moderate   GOALS: Goals reviewed with patient? Yes  SHORT TERM GOALS: Target date: 05/07/2023   Patient will be independent with initial HEP.  Baseline: given Goal status: INITIAL   LONG TERM GOALS: Target date: 06/18/2023   Patient will be independent with advanced/ongoing HEP to improve outcomes and carryover.  Baseline:  Goal status: INITIAL  2.  Patient will report at least 50% improvement in low back pain to improve QOL.  Baseline: constant severe Goal status: INITIAL  3.  Patient  will demonstrate improved functional strength as demonstrated by 4+/5 hip strength without increased pain. Baseline: see objective Goal status: INITIAL  4.  Patient will report at least 6 points improvement on modified Oswestry to demonstrate improved functional ability.  Baseline: 33/50 Goal status: INITIAL   5.  Patient will report 75% improvement in sleep disturbance to improve QOL. Baseline: tosses and turns all night due to pain Goal status: INITIAL  6. Patient will report expected outcome on FOTO Baseline:  TBD Goal status: INITIAL   PLAN:  PT FREQUENCY: 1-2x/week  PT DURATION: 8 weeks  PLANNED INTERVENTIONS: Therapeutic exercises, Therapeutic activity, Neuromuscular re-education, Balance training, Gait training, Patient/Family education, Self Care, Joint mobilization, Joint manipulation, Orthotic/Fit training, Dry Needling, Electrical stimulation, Spinal manipulation, Spinal mobilization, Cryotherapy, Moist heat, Taping, Traction, Ultrasound, Ionotophoresis 4mg /ml Dexamethasone, Manual therapy, and Re-evaluation.  PLAN FOR NEXT SESSION: continue with advancing core strength, ? TPDN.  Darleene Cleaver, PTA 05/09/2023, 5:26 PM

## 2023-05-14 ENCOUNTER — Encounter: Payer: Self-pay | Admitting: Physical Therapy

## 2023-05-14 ENCOUNTER — Ambulatory Visit: Payer: Medicaid Other | Admitting: Physical Therapy

## 2023-05-14 ENCOUNTER — Other Ambulatory Visit: Payer: Self-pay | Admitting: Family

## 2023-05-14 DIAGNOSIS — M6281 Muscle weakness (generalized): Secondary | ICD-10-CM

## 2023-05-14 DIAGNOSIS — R252 Cramp and spasm: Secondary | ICD-10-CM

## 2023-05-14 DIAGNOSIS — M5459 Other low back pain: Secondary | ICD-10-CM

## 2023-05-14 NOTE — Telephone Encounter (Signed)
Requesting: lorazepam 0.5mg   Contract: 05/07/23? UDS: 05/07/23 Last Visit: 04/08/23 Next Visit: 07/08/23 Last Refill: 03/01/23 #30 and 0RF  Please Advise

## 2023-05-14 NOTE — Therapy (Signed)
OUTPATIENT PHYSICAL THERAPY THORACOLUMBAR TREATMENT   Patient Name: Misty Beck MRN: [id number] DOB:01/12/1971, 52 y.o., female Today's Date: 05/14/2023  END OF SESSION:  PT End of Session - 05/14/23 0938     Visit Number 4    Authorization Type [employer]    PT Start Time [phone]    PT Stop Time 1030    PT Time Calculation (min) 53 min    Activity Tolerance Patient tolerated treatment well    Behavior During Therapy [facility] for tasks assessed/performed              Past Medical History:  Diagnosis Date   Asthma    Depression with anxiety 04/20/2021   Referral source Dr Amada Kingfisher bonsu   Diarrhea    referral source   Hyperlipidemia    referral source   Insomnia 04/20/2021   referral source Dr Christin Fudge   Received intravenous tissue plasminogen activator (tPA) in emergency department 10/24/2021   Stroke [facility])    Dec, 2021, Jan 2022   Past Surgical History:  Procedure Laterality Date   APPENDECTOMY  1992   left shoulder surgery     referral source Dr Bari Mantis   PARTIAL HYSTERECTOMY     2009   Patient Active Problem List   Diagnosis Date Noted   Chronic bilateral low back pain without sciatica 04/08/2023   Adrenal mass (HCC) 04/03/2023   Chronic migraine w/o aura w/o status migrainosus, not intractable 03/27/2023   Complicated migraine 01/04/2023   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   Stroke-like symptoms 12/27/2022   B12 deficiency 07/10/2022   Cerebrovascular accident (CVA) (HCC) 06/12/2022   Atypical chest pain 06/12/2022   Leg length discrepancy 06/12/2022   Weight loss 06/12/2022   Chronic nausea 04/16/2022   Chronic diarrhea 04/16/2022   History of CVA (cerebrovascular accident) 04/16/2022   Hot flashes 04/16/2022   History of migraine headaches 09/22/2021   Insomnia 09/22/2021   Chronic left shoulder pain 06/15/2020   Bipolar I disorder, single manic episode (HCC) 12/12/2012   Depression with anxiety 12/12/2012   Anxiety state 10/14/2012     PCP: Sandford Craze, NP   REFERRING PROVIDER: Sandford Craze, NP   REFERRING DIAG: M54.50,G89.29 (ICD-10-CM) - Chronic bilateral low back pain without sciatica   Rationale for Evaluation and Treatment: Rehabilitation  THERAPY DIAG:  Other low back pain  Cramp and spasm  Muscle weakness (generalized)  ONSET DATE: chronic since age 38  SUBJECTIVE:  SUBJECTIVE STATEMENT: Hurting today, cleaned the house yesterday.  She thinks the insole did help, but the cork is tearing up.    PERTINENT HISTORY:  Chronic back pain, stroke symptoms, migraine, adrenal mass, Bipolar I, Depression and Anxiety, leg length discrepancy   PAIN:  Are you having pain? Yes: NPRS scale: 9/10 Pain location: Left side low back  Pain description: throbbing pain Aggravating factors: standing walking, prolonged sitting, laying down Relieving factors: vicodin  PRECAUTIONS: None  RED FLAGS: None   WEIGHT BEARING RESTRICTIONS: No  FALLS:  Has patient fallen in last 6 months? 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 Otherwise, alignment is normal. There are no acute fractures or subluxations. No lytic or blastic lesions.   IMPRESSION: Mild scoliosis.  No evidence for acute  abnormality.  PATIENT SURVEYS:  Modified Oswestry 33/50   COGNITION: Overall cognitive status: Within functional limits for tasks  assessed     SENSATION: Light touch: Impaired  along L L45 dermatome.   MUSCLE LENGTH: Hamstrings: Right ~ 60 deg; Left ~ 60 deg Quads (prone knee bend: Right - mild tightness; Left - significant tightness, increased pain in back   POSTURE: decreased lumbar lordosis and weight shift right  PALPATION: Tenderness L4/5, L SIJ, L lumbar paraspinals, QL, glutes, muscle atrophy L erector spinale  LUMBAR ROM:   AROM eval  Flexion To knees, P!  Extension Limited 90% p!  Right lateral flexion To knees, p!  Left lateral flexion Immediate p!  Right rotation 50%  Left rotation 50%   (Blank rows = not tested)  LOWER EXTREMITY ROM:   decreased R hip ER compared to L, but otherwise WNL   LOWER EXTREMITY MMT:    MMT Right eval Left eval  Hip flexion 4+ 4  Hip extension 4+ 4  Hip abduction 4+ 4  Hip adduction 4+ 4  Knee flexion    Knee extension 4+ 4+  Ankle dorsiflexion 5 5  Ankle plantarflexion 5 5   (Blank rows = not tested)  LUMBAR SPECIAL TESTS:  Straight leg raise test: Negative and FABER test: Positive left for back pain  FUNCTIONAL TESTS:  Gait speed 80m/s  GAIT: Distance walked: 50; Assistive device utilized: None Level of assistance: Complete Independence Comments: w/s right, slight scoliosis with L hip higher than R.   TODAY'S TREATMENT:                                                                                                                              DATE:   05/14/23 Therapeutic Exercise: to improve strength and mobility.  Demo, verbal and tactile cues throughout  for technique.  Nustep L5  Review of HEP Added 1/4" rubber insert R shoe with resultant improved posture, less scoliosis noted and more symmetrical iliac crest height B Manual Therapy: to decrease muscle spasm and pain and improve mobility STM/TPR to bil glutes, MFR bil QL, IASTM with foam roller to glutes, QL, lumbar erector spinae Modalities: Estim (IFC) to lumbar region with MHP in prone  to decrease muscle spasm and pain.   05/09/23 STM to B glutes and lumbar paraspinals Passive stretching of piriformis, glutes, IR/ER stretches bil Supine for IFC, e stim, lumbar region , over moist heat x 12 min at end of session for pain management   05/07/23:  Therapeutic exercise:   Added 1/8" cork insert R shoe with resultant improved posture, less scoliosis noted and more symmetrical iliac crest height B Instructed in seated stabilization ex:  Determined to instruct in seated ex as patient on computer for several hrs at a time  Advised in isometric therex, educated that these ex are for improving stability for her lumbar spine, also may help some with breaking pain cycle, advised to vary the intensity of the isometric cxn according to her pain level and maintain 3 pts or less above her resting pain level for the ex.  See HEP below for details on ex.   Supine for IFC, e stim, lumbar region , over moist heat x 15 min at end of session for pain management   04/23/23 EVAL Self Care: findings, POC, initial HEP  Pain Neuroscience Education.  Patient educated on the concept of the nervous system as the bodies alarm system, and the role of nociception to warn the body of danger.  Peripheral nerve sensitization, hyperalgesia and allodynia were explained using metaphors to promote deep learning.  Patient educated on the concept of neuroplasticity and how factors such as temperature, stress, movement, immunity and blood flow affect pain via channel expression.    PATIENT EDUCATION:  Education details: HEP review Person educated: Patient Education method: Explanation Education comprehension: verbalized understanding  HOME EXERCISE PROGRAM: Access Code: [email or web address] URL: [email or web address] Date: 04/23/2023 Prepared by: Harrie Foreman  Exercises - Prone Pelvic Floor Contraction With Pillow  - 1 x daily - 7 x weekly - 3-5 min  hold - Prone Knee Flexion  - 1 x daily - 7 x  weekly - 1 sets - 10 reps  Patient Education - TENS UNIT - AUVON Dual Channel TENS Unit  ASSESSMENT:  CLINICAL IMPRESSION: Miguel Rota reports good compliance with initial HEP, meeting STG #1.  She is tolerating cork insole well so replaced with thicker rubber insole today.  Continued to focus on manual therapy as still quite painful, followed by estim and MHP.  She is going to order TENS unit, discussed bringing it to session if any help needed to set up.  Miguel Rota continues to demonstrate potential for improvement and would benefit from continued skilled therapy to address impairments.      OBJECTIVE IMPAIRMENTS: Abnormal gait, decreased activity tolerance, decreased endurance, decreased mobility, difficulty walking, decreased ROM, decreased strength, increased fascial restrictions, increased muscle spasms, impaired flexibility, and pain.   ACTIVITY LIMITATIONS: carrying, lifting, bending, sitting, standing, squatting, sleeping, stairs, and locomotion level  PARTICIPATION LIMITATIONS: meal prep, cleaning, laundry, driving, shopping, community activity, and occupation  PERSONAL FACTORS: Past/current experiences, Time since onset of injury/illness/exacerbation, and 1-2 comorbidities: Chronic back pain, stroke symptoms, migraine, adrenal mass, Bipolar I, Depression and Anxiety, leg length discrepancy  are also affecting patient's  functional outcome.   REHAB POTENTIAL: Good  CLINICAL DECISION MAKING: Evolving/moderate complexity  EVALUATION COMPLEXITY: Moderate   GOALS: Goals reviewed with patient? Yes  SHORT TERM GOALS: Target date: 05/07/2023   Patient will be independent with initial HEP.  Baseline: given Goal status: MET 05/14/23   LONG TERM GOALS: Target date: 06/18/2023   Patient will be independent with advanced/ongoing HEP to improve outcomes and carryover.  Baseline:  Goal status: IN PROGRESS  2.  Patient will report at least 50% improvement in low back pain to  improve QOL.  Baseline: constant severe Goal status: IN PROGRESS  3.  Patient will demonstrate improved functional strength as demonstrated by 4+/5 hip strength without increased pain. Baseline: see objective Goal status: IN PROGRESS  4.  Patient will report at least 6 points improvement on modified Oswestry to demonstrate improved functional ability.  Baseline: 33/50 Goal status: IN PROGRESS   5.  Patient will report 75% improvement in sleep disturbance to improve QOL. Baseline: tosses and turns all night due to pain Goal status: IN PROGRESS  6. Patient will report expected outcome on FOTO Baseline: TBD Goal status: INITIAL   PLAN:  PT FREQUENCY: 1-2x/week  PT DURATION: 8 weeks  PLANNED INTERVENTIONS: Therapeutic exercises, Therapeutic activity, Neuromuscular re-education, Balance training, Gait training, Patient/Family education, Self Care, Joint mobilization, Joint manipulation, Orthotic/Fit training, Dry Needling, Electrical stimulation, Spinal manipulation, Spinal mobilization, Cryotherapy, Moist heat, Taping, Traction, Ultrasound, Ionotophoresis 4mg /ml Dexamethasone, Manual therapy, and Re-evaluation.  PLAN FOR NEXT SESSION: continue with advancing core strength, declined TrDN- does not like.   Jena Gauss, PT, DPT  05/14/2023, 10:51 AM

## 2023-05-14 NOTE — Telephone Encounter (Signed)
Do you know if she signed a contract on 10/8 when she came in for UDS?

## 2023-05-16 ENCOUNTER — Other Ambulatory Visit: Payer: Self-pay

## 2023-05-17 ENCOUNTER — Ambulatory Visit: Payer: Medicaid Other

## 2023-05-17 DIAGNOSIS — R252 Cramp and spasm: Secondary | ICD-10-CM

## 2023-05-17 DIAGNOSIS — M5459 Other low back pain: Secondary | ICD-10-CM

## 2023-05-17 DIAGNOSIS — M6281 Muscle weakness (generalized): Secondary | ICD-10-CM

## 2023-05-17 NOTE — Therapy (Signed)
OUTPATIENT PHYSICAL THERAPY THORACOLUMBAR TREATMENT   Patient Name: Misty Beck MRN: [id number] DOB:[birth year], 51 y.o., female Today's Date: 05/17/2023  END OF SESSION:  PT End of Session - 05/17/23 0818     Visit Number 5    Authorization Type [employer] Medicaid    PT Start Time 0808    PT Stop Time 0905    PT Time Calculation (min) 57 min    Activity Tolerance Patient tolerated treatment well    Behavior During Therapy [facility] for tasks assessed/performed               Past Medical History:  Diagnosis Date   Asthma    Depression with anxiety 04/20/2021   Referral source Dr Amada Kingfisher bonsu   Diarrhea    referral source   Hyperlipidemia    referral source   Insomnia 04/20/2021   referral source Dr Christin Fudge   Received intravenous tissue plasminogen activator (tPA) in emergency department 10/24/2021   Stroke [facility])    Dec, 2021, Jan 2022   Past Surgical History:  Procedure Laterality Date   APPENDECTOMY  1992   left shoulder surgery     referral source Dr Bari Mantis   PARTIAL HYSTERECTOMY     2009   Patient Active Problem List   Diagnosis Date Noted   Chronic bilateral low back pain without sciatica 04/08/2023   Adrenal mass (HCC) 04/03/2023   Chronic migraine w/o aura w/o status migrainosus, not intractable 03/27/2023   Complicated migraine 01/04/2023   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   Stroke-like symptoms 12/27/2022   B12 deficiency 07/10/2022   Cerebrovascular accident (CVA) (HCC) 06/12/2022   Atypical chest pain 06/12/2022   Leg length discrepancy 06/12/2022   Weight loss 06/12/2022   Chronic nausea 04/16/2022   Chronic diarrhea 04/16/2022   History of CVA (cerebrovascular accident) 04/16/2022   Hot flashes 04/16/2022   History of migraine headaches 09/22/2021   Insomnia 09/22/2021   Chronic left shoulder pain 06/15/2020   Bipolar I disorder, single manic episode (HCC) 12/12/2012   Depression with anxiety 12/12/2012   Anxiety state  10/14/2012    PCP: Sandford Craze, NP   REFERRING PROVIDER: Sandford Craze, NP   REFERRING DIAG: M54.50,G89.29 (ICD-10-CM) - Chronic bilateral low back pain without sciatica   Rationale for Evaluation and Treatment: Rehabilitation  THERAPY DIAG:  Other low back pain  Cramp and spasm  Muscle weakness (generalized)  ONSET DATE: chronic since age 1  SUBJECTIVE:  SUBJECTIVE STATEMENT: Pt reports improved pain today because she just woke up.   PERTINENT HISTORY:  Chronic back pain, stroke symptoms, migraine, adrenal mass, Bipolar I, Depression and Anxiety, leg length discrepancy   PAIN:  Are you having pain? Yes: NPRS scale: 5/10 Pain location: Left side low back  Pain description: aching, stiff pain Aggravating factors: standing walking, prolonged sitting, laying down Relieving factors: vicodin  PRECAUTIONS: None  RED FLAGS: None   WEIGHT BEARING RESTRICTIONS: No  FALLS:  Has patient fallen in last 6 months? 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 Otherwise, alignment is normal. There are no acute fractures or subluxations. No lytic or blastic lesions.   IMPRESSION: Mild scoliosis.  No evidence for acute  abnormality.  PATIENT SURVEYS:  Modified Oswestry 33/50   COGNITION: Overall cognitive status: Within functional limits for tasks assessed     SENSATION: Light touch: Impaired  along L L45  dermatome.   MUSCLE LENGTH: Hamstrings: Right ~ 60 deg; Left ~ 60 deg Quads (prone knee bend: Right - mild tightness; Left - significant tightness, increased pain in back   POSTURE: decreased lumbar lordosis and weight shift right  PALPATION: Tenderness L4/5, L SIJ, L lumbar paraspinals, QL, glutes, muscle atrophy L erector spinale  LUMBAR ROM:   AROM eval  Flexion To knees, P!  Extension Limited 90% p!  Right lateral flexion To knees, p!  Left lateral flexion Immediate p!  Right rotation 50%  Left rotation 50%   (Blank rows = not tested)  LOWER EXTREMITY ROM:   decreased R hip ER compared to L, but otherwise WNL   LOWER EXTREMITY MMT:    MMT Right eval Left eval  Hip flexion 4+ 4  Hip extension 4+ 4  Hip abduction 4+ 4  Hip adduction 4+ 4  Knee flexion    Knee extension 4+ 4+  Ankle dorsiflexion 5 5  Ankle plantarflexion 5 5   (Blank rows = not tested)  LUMBAR SPECIAL TESTS:  Straight leg raise test: Negative and FABER test: Positive left for back pain  FUNCTIONAL TESTS:  Gait speed 32m/s  GAIT: Distance walked: 50; Assistive device utilized: None Level of assistance: Complete Independence Comments: w/s right, slight scoliosis with L hip higher than R.   TODAY'S TREATMENT:                                                                                                                              DATE:  05/17/23 Therapeutic Exercise: to improve strength and mobility.  Demo, verbal and tactile cues throughout for technique.  Bike L1x69min Hooklying LTR x 10 B  Hooklying pelvic tilts 10x3" Hooklying march x 10 BLE Hooklying resisted hip flexion 5x5"  Manual Therapy: to decrease muscle spasm and pain and improve mobility  IASTM with foam roller to glutes, QL, lumbar paraspinals  Modalities: Estim (IFC) to lumbar region with MHP in prone x 10 min to decrease muscle spasm and pain.   05/14/23 Therapeutic Exercise: to improve strength and mobility.  Demo,  verbal and tactile cues throughout for technique.  Nustep L5  Review of HEP Added 1/4" rubber insert R shoe with resultant improved posture, less scoliosis noted and more symmetrical iliac crest height B Manual Therapy: to decrease muscle spasm and pain and improve mobility STM/TPR to bil glutes, MFR bil QL, IASTM with foam roller to glutes, QL, lumbar erector spinae Modalities: Estim (IFC) to lumbar region with MHP in prone to decrease muscle spasm and pain.   05/09/23 STM to B glutes and lumbar paraspinals Passive stretching of piriformis, glutes, IR/ER stretches bil Supine for IFC, e stim, lumbar region , over moist heat x 12 min at end of session for pain management   05/07/23:  Therapeutic exercise:   Added 1/8" cork insert R shoe with resultant improved posture, less scoliosis noted and more symmetrical iliac crest height B Instructed in seated stabilization ex:  Determined to instruct in seated ex as patient on computer for several hrs at a time  Advised in isometric therex, educated that these ex are for improving stability for her lumbar spine, also may help some with breaking pain cycle, advised to vary the intensity of the isometric cxn according to her pain level and maintain 3 pts or less above her resting pain level for the ex.  See HEP below for details on ex.   Supine for IFC, e stim, lumbar region , over moist heat x 15 min at end of session for pain management   04/23/23 EVAL Self Care: findings, POC, initial HEP  Pain Neuroscience Education.  Patient educated on the concept of the nervous system as the bodies alarm system, and the role of nociception to warn the body of danger.  Peripheral nerve sensitization, hyperalgesia and allodynia were explained using metaphors to promote deep learning.  Patient educated on the concept of neuroplasticity and how factors such as temperature, stress, movement, immunity and blood flow affect pain via channel expression.    PATIENT  EDUCATION:  Education details: HEP review Person educated: Patient Education method: Explanation Education comprehension: verbalized understanding  HOME EXERCISE PROGRAM: Access Code: [email or web address] URL: [email or web address] Date: 04/23/2023 Prepared by: Harrie Foreman  Exercises - Prone Pelvic Floor Contraction With Pillow  - 1 x daily - 7 x weekly - 3-5 min  hold - Prone Knee Flexion  - 1 x daily - 7 x weekly - 1 sets - 10 reps  Patient Education - TENS UNIT - AUVON Dual Channel TENS Unit  ASSESSMENT:  CLINICAL IMPRESSION: Misty Beck reports improved pain today. Gentle exercises today for lumbo-pelvic mobility and strength. Pt noted increased soreness post exercise. Addressed with IASTM which calmed down pain. Estim also post session to address pain.  Miguel Rota continues to demonstrate potential for improvement and would benefit from continued skilled therapy to address impairments.      OBJECTIVE IMPAIRMENTS: Abnormal gait, decreased activity tolerance, decreased endurance, decreased mobility, difficulty walking, decreased ROM, decreased strength, increased fascial restrictions, increased muscle spasms, impaired flexibility, and pain.   ACTIVITY LIMITATIONS: carrying, lifting, bending, sitting, standing, squatting, sleeping, stairs, and locomotion level  PARTICIPATION LIMITATIONS: meal prep, cleaning, laundry, driving, shopping, community activity, and occupation  PERSONAL FACTORS: Past/current experiences, Time since onset of injury/illness/exacerbation, and 1-2 comorbidities: Chronic back pain, stroke symptoms, migraine, adrenal mass, Bipolar I, Depression and Anxiety, leg length discrepancy  are also affecting patient's functional outcome.   REHAB POTENTIAL: Good  CLINICAL DECISION MAKING: Evolving/moderate complexity  EVALUATION COMPLEXITY: Moderate   GOALS: Goals reviewed with patient? Yes  SHORT TERM GOALS: Target date: 05/07/2023   Patient  will be independent with initial HEP.  Baseline: given Goal status: MET 05/14/23   LONG TERM GOALS: Target date: 06/18/2023   Patient will be independent with advanced/ongoing HEP to improve outcomes and carryover.  Baseline:  Goal status: IN PROGRESS  2.  Patient will report at least 50% improvement in low back pain to improve QOL.  Baseline: constant severe Goal status: IN PROGRESS  3.  Patient will demonstrate improved functional strength as demonstrated by 4+/5 hip strength without increased pain. Baseline: see objective Goal status: IN PROGRESS  4.  Patient will report at least 6 points improvement on modified Oswestry to demonstrate improved functional ability.  Baseline: 33/50 Goal status: IN PROGRESS   5.  Patient will report 75% improvement in sleep disturbance to improve QOL. Baseline: tosses and turns all night due to pain Goal status: IN PROGRESS  6. Patient will report expected outcome on FOTO Baseline: TBD Goal status: IN PROGRESS   PLAN:  PT FREQUENCY: 1-2x/week  PT DURATION: 8 weeks  PLANNED INTERVENTIONS: Therapeutic exercises, Therapeutic activity, Neuromuscular re-education, Balance training, Gait training, Patient/Family education, Self Care, Joint mobilization, Joint manipulation, Orthotic/Fit training, Dry Needling, Electrical stimulation, Spinal manipulation, Spinal mobilization, Cryotherapy, Moist heat, Taping, Traction, Ultrasound, Ionotophoresis 4mg /ml Dexamethasone, Manual therapy, and Re-evaluation.  PLAN FOR NEXT SESSION: continue with advancing core strength, declined TrDN- does not like. Like estim  Darleene Cleaver, PTA 05/17/2023, 8:58 AM

## 2023-05-21 ENCOUNTER — Ambulatory Visit: Payer: Medicaid Other | Admitting: Physical Therapy

## 2023-05-21 ENCOUNTER — Encounter: Payer: Self-pay | Admitting: Physical Therapy

## 2023-05-21 DIAGNOSIS — M6281 Muscle weakness (generalized): Secondary | ICD-10-CM

## 2023-05-21 DIAGNOSIS — R252 Cramp and spasm: Secondary | ICD-10-CM

## 2023-05-21 DIAGNOSIS — M5459 Other low back pain: Secondary | ICD-10-CM

## 2023-05-21 NOTE — Therapy (Signed)
OUTPATIENT PHYSICAL THERAPY THORACOLUMBAR TREATMENT   Patient Name: Misty Beck MRN: [id number] DOB:Aug 31, 1970, 52 y.o., female Today's Date: 05/21/2023  END OF SESSION:  PT End of Session - 05/21/23 1322     Visit Number 6    Date for PT Re-Evaluation 06/18/23    Authorization Type [employer] Medicaid    PT Start Time 1319    PT Stop Time 1404    PT Time Calculation (min) 45 min    Activity Tolerance Patient tolerated treatment well    Behavior During Therapy [facility] for tasks assessed/performed               Past Medical History:  Diagnosis Date   Asthma    Depression with anxiety 04/20/2021   Referral source Dr Amada Kingfisher bonsu   Diarrhea    referral source   Hyperlipidemia    referral source   Insomnia 04/20/2021   referral source Dr Christin Fudge   Received intravenous tissue plasminogen activator (tPA) in emergency department 10/24/2021   Stroke [facility])    Dec, 2021, Jan 2022   Past Surgical History:  Procedure Laterality Date   APPENDECTOMY  1992   left shoulder surgery     referral source Dr Bari Mantis   PARTIAL HYSTERECTOMY     2009   Patient Active Problem List   Diagnosis Date Noted   Chronic bilateral low back pain without sciatica 04/08/2023   Adrenal mass (HCC) 04/03/2023   Chronic migraine w/o aura w/o status migrainosus, not intractable 03/27/2023   Complicated migraine 01/04/2023   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   Stroke-like symptoms 12/27/2022   B12 deficiency 07/10/2022   Cerebrovascular accident (CVA) (HCC) 06/12/2022   Atypical chest pain 06/12/2022   Leg length discrepancy 06/12/2022   Weight loss 06/12/2022   Chronic nausea 04/16/2022   Chronic diarrhea 04/16/2022   History of CVA (cerebrovascular accident) 04/16/2022   Hot flashes 04/16/2022   History of migraine headaches 09/22/2021   Insomnia 09/22/2021   Chronic left shoulder pain 06/15/2020   Bipolar I disorder, single manic episode (HCC) 12/12/2012   Depression with  anxiety 12/12/2012   Anxiety state 10/14/2012    PCP: Sandford Craze, NP   REFERRING PROVIDER: Sandford Craze, NP   REFERRING DIAG: M54.50,G89.29 (ICD-10-CM) - Chronic bilateral low back pain without sciatica   Rationale for Evaluation and Treatment: Rehabilitation  THERAPY DIAG:  Other low back pain  Cramp and spasm  Muscle weakness (generalized)  ONSET DATE: chronic since age 35  SUBJECTIVE:  SUBJECTIVE STATEMENT: Pain is bad today, been stressing, cleaning house, decorating, making beds, etc, "I hurt."   Brought in her TENS.  Kids coming Friday for [employer] on Sunday, worried about back.   PERTINENT HISTORY:  Chronic back pain, stroke symptoms, migraine, adrenal mass, Bipolar I, Depression and Anxiety, leg length discrepancy   PAIN:  Are you having pain? Yes: NPRS scale: 11/10 Pain location: Left side low back  Pain description: aching, stiff pain Aggravating factors: standing walking, prolonged sitting, laying down Relieving factors: vicodin  PRECAUTIONS: None  RED FLAGS: None   WEIGHT BEARING RESTRICTIONS: No  FALLS:  Has patient fallen in last 6 months? 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 Otherwise, alignment is normal. There are no acute fractures or subluxations. No lytic or blastic lesions.   IMPRESSION: Mild scoliosis.  No evidence for acute  abnormality.  PATIENT SURVEYS:   Modified Oswestry 33/50   COGNITION: Overall cognitive status: Within functional limits for tasks assessed     SENSATION: Light touch: Impaired  along L L45 dermatome.   MUSCLE LENGTH: Hamstrings: Right ~ 60 deg; Left ~ 60 deg Quads (prone knee bend: Right - mild tightness; Left - significant tightness, increased pain in back   POSTURE: decreased lumbar lordosis and weight shift right  PALPATION: Tenderness L4/5, L SIJ, L lumbar paraspinals, QL, glutes, muscle atrophy L erector spinale  LUMBAR ROM:   AROM eval  Flexion To knees, P!  Extension Limited 90% p!  Right lateral flexion To knees, p!  Left lateral flexion Immediate p!  Right rotation 50%  Left rotation 50%   (Blank rows = not tested)  LOWER EXTREMITY ROM:   decreased R hip ER compared to L, but otherwise WNL   LOWER EXTREMITY MMT:    MMT Right eval Left eval  Hip flexion 4+ 4  Hip extension 4+ 4  Hip abduction 4+ 4  Hip adduction 4+ 4  Knee flexion    Knee extension 4+ 4+  Ankle dorsiflexion 5 5  Ankle plantarflexion 5 5   (Blank rows = not tested)  LUMBAR SPECIAL TESTS:  Straight leg raise test: Negative and FABER test: Positive left for back pain  FUNCTIONAL TESTS:  Gait speed 57m/s  GAIT: Distance walked: 50; Assistive device utilized: None Level of assistance: Complete Independence Comments: w/s right, slight scoliosis with L hip higher than R.   TODAY'S TREATMENT:                                                                                                                              DATE:   05/21/23 Therapeutic Exercise:  to improve strength and mobility.  Demo, verbal and tactile cues throughout for technique. Nustep L5 x 5 min  In supine - with MHP and TENS unit during exercises for comfort: Decompression exercise x 3 min  Shoulder Press x 10 Head press x 10 Leg lengthener x 5 each Leg press x 5 each Self Care: Set-up and care of TENS unit to low back, selecting Mode, time,  intensity, electrode care, where to purchase additional electrodes.    05/17/23 Therapeutic Exercise: to improve strength and mobility.  Demo, verbal and tactile cues throughout for technique.  Bike L1x80min Hooklying LTR x 10 B Hooklying pelvic tilts 10x3" Hooklying march x 10 BLE Hooklying resisted hip flexion 5x5"  Manual Therapy: to decrease muscle spasm and pain and improve mobility  IASTM with foam roller to glutes, QL, lumbar paraspinals  Modalities: Estim (IFC) to lumbar region with MHP in prone x 10 min to decrease muscle spasm and pain.   05/14/23 Therapeutic Exercise: to improve strength and mobility.  Demo, verbal and tactile cues throughout for technique.  Nustep L5  Review of HEP Added 1/4" rubber insert R shoe with resultant improved posture, less scoliosis noted and more symmetrical iliac crest height B Manual Therapy: to decrease muscle spasm and pain and improve mobility STM/TPR to bil glutes, MFR bil QL, IASTM with foam roller to glutes, QL, lumbar erector spinae Modalities: Estim (IFC) to lumbar region with MHP in prone to decrease muscle spasm and pain.   05/09/23 STM to B glutes and lumbar paraspinals Passive stretching of piriformis, glutes, IR/ER stretches bil Supine for IFC, e stim, lumbar region , over moist heat x 12 min at end of session for pain management     PATIENT EDUCATION:  Education details: HEP review Person educated: Patient Education method: Explanation Education comprehension: verbalized understanding  HOME EXERCISE PROGRAM: Access Code: [email or web address] URL: [email or web address] Date: 04/23/2023 Prepared by: Harrie Foreman  Exercises - Prone Pelvic Floor Contraction With Pillow  - 1 x daily - 7 x weekly - 3-5 min  hold - Prone Knee Flexion  - 1 x daily - 7 x weekly - 1 sets - 10 reps  Patient Education - TENS UNIT - AUVON Dual Channel TENS Unit  Decompression Exercises.   ASSESSMENT:  CLINICAL IMPRESSION: Misty Beck had more pain today.  She brought in her new TENS unit so focused beginning of session on set up and use, then used it along with MHP with gentle decompression exercises.  Decreased pain reported afterwards.  Recommended taking breaks during day from housework activities to perform decompression exercises to modulate pain.  Miguel Rota continues to demonstrate potential for improvement and would benefit from continued skilled therapy to address impairments.      OBJECTIVE IMPAIRMENTS: Abnormal gait, decreased activity tolerance, decreased endurance, decreased mobility, difficulty walking, decreased ROM, decreased strength, increased fascial restrictions, increased muscle spasms, impaired flexibility, and pain.   ACTIVITY LIMITATIONS: carrying, lifting, bending, sitting, standing, squatting, sleeping, stairs, and locomotion level  PARTICIPATION LIMITATIONS: meal prep, cleaning, laundry, driving, shopping, community activity, and occupation  PERSONAL FACTORS: Past/current experiences, Time since onset of injury/illness/exacerbation, and 1-2 comorbidities: Chronic back pain, stroke symptoms, migraine, adrenal mass, Bipolar I, Depression and Anxiety, leg length discrepancy  are also affecting patient's functional outcome.   REHAB POTENTIAL: Good  CLINICAL DECISION MAKING: Evolving/moderate complexity  EVALUATION COMPLEXITY: Moderate   GOALS: Goals reviewed with patient? Yes  SHORT TERM GOALS: Target date: 05/07/2023   Patient will  be independent with initial HEP.  Baseline: given Goal status: MET 05/14/23   LONG TERM GOALS: Target date: 06/18/2023   Patient will be independent with advanced/ongoing HEP to improve outcomes and carryover.  Baseline:  Goal status: IN PROGRESS  2.  Patient will report at least 50% improvement in low back pain to improve QOL.  Baseline: constant severe Goal status: IN PROGRESS  3.  Patient will demonstrate improved functional strength as  demonstrated by 4+/5 hip strength without increased pain. Baseline: see objective Goal status: IN PROGRESS  4.  Patient will report at least 6 points improvement on modified Oswestry to demonstrate improved functional ability.  Baseline: 33/50 Goal status: IN PROGRESS   5.  Patient will report 75% improvement in sleep disturbance to improve QOL. Baseline: tosses and turns all night due to pain Goal status: IN PROGRESS  6. Patient will report expected outcome on FOTO Baseline: TBD Goal status: IN PROGRESS   PLAN:  PT FREQUENCY: 1-2x/week  PT DURATION: 8 weeks  PLANNED INTERVENTIONS: Therapeutic exercises, Therapeutic activity, Neuromuscular re-education, Balance training, Gait training, Patient/Family education, Self Care, Joint mobilization, Joint manipulation, Orthotic/Fit training, Dry Needling, Electrical stimulation, Spinal manipulation, Spinal mobilization, Cryotherapy, Moist heat, Taping, Traction, Ultrasound, Ionotophoresis 4mg /ml Dexamethasone, Manual therapy, and Re-evaluation.  PLAN FOR NEXT SESSION: continue with advancing core strength, declined TrDN- does not like. Like estim  Jena Gauss, PT 05/21/2023, 2:09 PM

## 2023-05-23 ENCOUNTER — Ambulatory Visit: Payer: Medicaid Other | Admitting: Physical Therapy

## 2023-05-24 NOTE — Telephone Encounter (Signed)
Pharmacy Patient Advocate Encounter  Received notification from [facility] that Prior Authorization for Nurtec 75mg  has been DENIED.  See denial reason below. No denial letter attached in CMM. Will attache denial letter to Media tab once received.   PA #/Case ID/Reference #: (1) You have not had more than 15 headache days per month during the past 6 months. (2) You have tried at least two preferred triptans: rizatriptan tablet or oral disintegrating tablet and sumatriptan tablet or nasal spray.

## 2023-05-24 NOTE — Telephone Encounter (Signed)
Pharmacy Patient Advocate Encounter  Received notification from [facility] that Prior Authorization for Aimovig has been DENIED.  See denial reason below. No denial letter attached in CMM. Will attache denial letter to Media tab once received.   PA #/Case ID/Reference #: You have tried two of the following drugs for at least one month: (A) Antidepressants (for example, amitriptyline, venlafaxine). (B) Beta blockers (for example, propranolol, metoprolol, timolol, atenolol). (C) Anti-epileptics (for example, valproate, topiramate). (D) Angiotensin converting enzyme inhibitors/angiotensin II receptor blockers (for example, lisinopril, candesartan). (E) Calcium channel blockers (for example, verapamil, nimodipine)

## 2023-05-27 MED ORDER — PROPRANOLOL HCL ER 60 MG PO CP24: 60.0000 mg | ORAL_CAPSULE | Freq: Every day | ORAL | 11 refills | Status: DC

## 2023-05-27 NOTE — Telephone Encounter (Signed)
Meds ordered this encounter  Medications   propranolol ER (INDERAL LA) 60 MG 24 hr capsule    Sig: Take 1 capsule (60 mg total) by mouth daily.    Dispense:  30 capsule    Refill:  11  Please let patient's know, aimovig as migraine prevention was denied by her insurance  Propranolol as migraine prevention

## 2023-05-27 NOTE — Addendum Note (Signed)
Addended by: Levert Feinstein on: 05/27/2023 02:01 PM   Modules accepted: Orders

## 2023-05-28 ENCOUNTER — Ambulatory Visit: Payer: Medicaid Other | Admitting: Physical Therapy

## 2023-05-28 ENCOUNTER — Encounter: Payer: Self-pay | Admitting: Physical Therapy

## 2023-05-28 DIAGNOSIS — R252 Cramp and spasm: Secondary | ICD-10-CM

## 2023-05-28 DIAGNOSIS — M5459 Other low back pain: Secondary | ICD-10-CM

## 2023-05-28 DIAGNOSIS — M6281 Muscle weakness (generalized): Secondary | ICD-10-CM

## 2023-05-28 NOTE — Therapy (Signed)
OUTPATIENT PHYSICAL THERAPY THORACOLUMBAR TREATMENT   Patient Name: Misty Beck MRN: [id number] DOB:May 27, 1971, 52 y.o., female Today's Date: 05/28/2023  END OF SESSION:  PT End of Session - 05/28/23 1321     Visit Number 7    Date for PT Re-Evaluation 06/18/23    Authorization Type [employer] Medicaid    PT Start Time 1320    PT Stop Time 1413    PT Time Calculation (min) 53 min    Activity Tolerance Patient tolerated treatment well    Behavior During Therapy [facility] for tasks assessed/performed               Past Medical History:  Diagnosis Date   Asthma    Depression with anxiety 04/20/2021   Referral source Dr Amada Kingfisher bonsu   Diarrhea    referral source   Hyperlipidemia    referral source   Insomnia 04/20/2021   referral source Dr Christin Fudge   Received intravenous tissue plasminogen activator (tPA) in emergency department 10/24/2021   Stroke [facility])    Dec, 2021, Jan 2022   Past Surgical History:  Procedure Laterality Date   APPENDECTOMY  1992   left shoulder surgery     referral source Dr Bari Mantis   PARTIAL HYSTERECTOMY     2009   Patient Active Problem List   Diagnosis Date Noted   Chronic bilateral low back pain without sciatica 04/08/2023   Adrenal mass (HCC) 04/03/2023   Chronic migraine w/o aura w/o status migrainosus, not intractable 03/27/2023   Complicated migraine 01/04/2023   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   Stroke-like symptoms 12/27/2022   B12 deficiency 07/10/2022   Cerebrovascular accident (CVA) (HCC) 06/12/2022   Atypical chest pain 06/12/2022   Leg length discrepancy 06/12/2022   Weight loss 06/12/2022   Chronic nausea 04/16/2022   Chronic diarrhea 04/16/2022   History of CVA (cerebrovascular accident) 04/16/2022   Hot flashes 04/16/2022   History of migraine headaches 09/22/2021   Insomnia 09/22/2021   Chronic left shoulder pain 06/15/2020   Bipolar I disorder, single manic episode (HCC) 12/12/2012   Depression with  anxiety 12/12/2012   Anxiety state 10/14/2012    PCP: Sandford Craze, NP   REFERRING PROVIDER: Sandford Craze, NP   REFERRING DIAG: M54.50,G89.29 (ICD-10-CM) - Chronic bilateral low back pain without sciatica   Rationale for Evaluation and Treatment: Rehabilitation  THERAPY DIAG:  Other low back pain  Cramp and spasm  Muscle weakness (generalized)  ONSET DATE: chronic since age 56  SUBJECTIVE:  SUBJECTIVE STATEMENT: Last 3 days pain has been bad, left side going up from hip up back.  Can barely moved.  Didn't do anything other than walk around [employer].    PERTINENT HISTORY:  Chronic back pain, stroke symptoms, migraine, adrenal mass, Bipolar I, Depression and Anxiety, leg length discrepancy   PAIN:  Are you having pain? Yes: NPRS scale: 11/10 Pain location: Left side low back  Pain description: aching, stiff pain Aggravating factors: standing walking, prolonged sitting, laying down Relieving factors: vicodin  PRECAUTIONS: None  RED FLAGS: None   WEIGHT BEARING RESTRICTIONS: No  FALLS:  Has patient fallen in last 6 months? 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 Otherwise, alignment is normal. There are no acute fractures or subluxations. No lytic or blastic lesions.   IMPRESSION: Mild scoliosis.  No evidence for acute  abnormality.  PATIENT SURVEYS:  Modified Oswestry 33/50    COGNITION: Overall cognitive status: Within functional limits for tasks assessed     SENSATION: Light touch: Impaired  along L L45 dermatome.   MUSCLE LENGTH: Hamstrings: Right ~ 60 deg; Left ~ 60 deg Quads (prone knee bend: Right - mild tightness; Left - significant tightness, increased pain in back   POSTURE: decreased lumbar lordosis and weight shift right  PALPATION: Tenderness L4/5, L SIJ, L lumbar paraspinals, QL, glutes, muscle atrophy L erector spinale  LUMBAR ROM:   AROM eval  Flexion To knees, P!  Extension Limited 90% p!  Right lateral flexion To knees, p!  Left lateral flexion Immediate p!  Right rotation 50%  Left rotation 50%   (Blank rows = not tested)  LOWER EXTREMITY ROM:   decreased R hip ER compared to L, but otherwise WNL   LOWER EXTREMITY MMT:    MMT Right eval Left eval  Hip flexion 4+ 4  Hip extension 4+ 4  Hip abduction 4+ 4  Hip adduction 4+ 4  Knee flexion    Knee extension 4+ 4+  Ankle dorsiflexion 5 5  Ankle plantarflexion 5 5   (Blank rows = not tested)  LUMBAR SPECIAL TESTS:  Straight leg raise test: Negative and FABER test: Positive left for back pain  FUNCTIONAL TESTS:  Gait speed 51m/s  GAIT: Distance walked: 50; Assistive device utilized: None Level of assistance: Complete Independence Comments: w/s right, slight scoliosis with L hip higher than R.   TODAY'S TREATMENT:                                                                                                                              DATE:   05/28/23 Therapeutic Exercise: to  improve strength and mobility.  Demo, verbal and tactile cues throughout for technique. Nustep L4 x 3 min  Manual Therapy: to decrease muscle spasm and pain and improve mobility STM/TPR to L glutes, QL, QL stretch, cross friction L SIJ, Mobs L pelvis and P/A mobs sacrum,  IASTM with foam roller to glutes, skilled palpation and monitoring during dry needling. Trigger Point Dry-Needling   Treatment instructions: Expect mild to moderate muscle soreness. S/S of pneumothorax if dry needled over a lung field, and to seek immediate medical attention should they occur. Patient verbalized understanding of these instructions and education. Patient Consent Given: Yes Education handout provided: Yes Muscles treated: L glute min/max/med Electrical stimulation performed: No Parameters: N/A Treatment response/outcome: Twitch Response Elicited and Palpable Increase in Muscle Length Modalities: Estim (premod) to L glutes + MHP in prone x 10 min    05/21/23 Therapeutic Exercise: to improve strength and mobility.  Demo, verbal and tactile cues throughout for technique. Nustep L5 x 5 min  In supine - with MHP and TENS unit during exercises for comfort: Decompression exercise x 3 min  Shoulder Press x 10 Head press x 10 Leg lengthener x 5 each Leg press x 5 each Self Care: Set-up and care of TENS unit to low back, selecting Mode, time, intensity, electrode care, where to purchase additional electrodes.    05/17/23 Therapeutic Exercise: to improve strength and mobility.  Demo, verbal and tactile cues throughout for technique.  Bike L1x44min Hooklying LTR x 10 B Hooklying pelvic tilts 10x3" Hooklying march x 10 BLE Hooklying resisted hip flexion 5x5"  Manual Therapy: to decrease muscle spasm and pain and improve mobility  IASTM with foam roller to glutes, QL, lumbar paraspinals  Modalities: Estim (IFC) to lumbar region with MHP in prone x 10 min to decrease muscle spasm and pain.     PATIENT EDUCATION:  Education details: continue HEP as tolerated.  Person educated: Patient Education method: Explanation Education comprehension: verbalized understanding  HOME EXERCISE PROGRAM: Access Code: [email or web address] URL: [email or web address] Date: 04/23/2023 Prepared by: Harrie Foreman  Exercises - Prone Pelvic Floor Contraction With Pillow  - 1 x daily - 7 x weekly - 3-5  min  hold - Prone Knee Flexion  - 1 x daily - 7 x weekly - 1 sets - 10 reps  Patient Education - TENS UNIT - AUVON Dual Channel TENS Unit  Decompression Exercises.   ASSESSMENT:  CLINICAL IMPRESSION: CHUMY MOMENT reported significant increased in L sided LBP/SIJ pain today, denying any change in activity, other than 4 hours of standing and walking at [employer] this weekend.  Explained with LLD, standing and walking for 4 hours could potentially irritation and cause symptoms she was describing, more likely this than the gentle decompression exercises or TENS.    Focus of session was manual therapy to decrease pain.  After explanation of DN rational, procedures, outcomes and potential side effects, patient verbalized consent to DN treatment in conjunction with manual STM/DTM and TPR to reduce ttp/muscle tension. Muscles treated as indicated above. DN produced normal response with good twitches elicited resulting in palpable reduction in pain/ttp and muscle tension, with patient noting less pain upon initiation of movement following DN. Pt educated to expect mild to moderate muscle soreness for up to 24-48 hrs and instructed to continue prescribed home exercise program and current activity level with pt verbalizing understanding of theses instructions.  Also applied MHP + Estim at end of session to further decrease spasm.  Zenia Resides  Beekman continues to demonstrate potential for improvement and would benefit from continued skilled therapy to address impairments.      OBJECTIVE IMPAIRMENTS: Abnormal gait, decreased activity tolerance, decreased endurance, decreased mobility, difficulty walking, decreased ROM, decreased strength, increased fascial restrictions, increased muscle spasms, impaired flexibility, and pain.   ACTIVITY LIMITATIONS: carrying, lifting, bending, sitting, standing, squatting, sleeping, stairs, and locomotion level  PARTICIPATION LIMITATIONS: meal prep, cleaning, laundry, driving,  shopping, community activity, and occupation  PERSONAL FACTORS: Past/current experiences, Time since onset of injury/illness/exacerbation, and 1-2 comorbidities: Chronic back pain, stroke symptoms, migraine, adrenal mass, Bipolar I, Depression and Anxiety, leg length discrepancy  are also affecting patient's functional outcome.   REHAB POTENTIAL: Good  CLINICAL DECISION MAKING: Evolving/moderate complexity  EVALUATION COMPLEXITY: Moderate   GOALS: Goals reviewed with patient? Yes  SHORT TERM GOALS: Target date: 05/07/2023   Patient will be independent with initial HEP.  Baseline: given Goal status: MET 05/14/23   LONG TERM GOALS: Target date: 06/18/2023   Patient will be independent with advanced/ongoing HEP to improve outcomes and carryover.  Baseline:  Goal status: IN PROGRESS  2.  Patient will report at least 50% improvement in low back pain to improve QOL.  Baseline: constant severe Goal status: IN PROGRESS  3.  Patient will demonstrate improved functional strength as demonstrated by 4+/5 hip strength without increased pain. Baseline: see objective Goal status: IN PROGRESS  4.  Patient will report at least 6 points improvement on modified Oswestry to demonstrate improved functional ability.  Baseline: 33/50 Goal status: IN PROGRESS   5.  Patient will report 75% improvement in sleep disturbance to improve QOL. Baseline: tosses and turns all night due to pain Goal status: IN PROGRESS  6. Patient will report expected outcome on FOTO Baseline: TBD Goal status: IN PROGRESS   PLAN:  PT FREQUENCY: 1-2x/week  PT DURATION: 8 weeks  PLANNED INTERVENTIONS: Therapeutic exercises, Therapeutic activity, Neuromuscular re-education, Balance training, Gait training, Patient/Family education, Self Care, Joint mobilization, Joint manipulation, Orthotic/Fit training, Dry Needling, Electrical stimulation, Spinal manipulation, Spinal mobilization, Cryotherapy, Moist heat, Taping,  Traction, Ultrasound, Ionotophoresis 4mg /ml Dexamethasone, Manual therapy, and Re-evaluation.  PLAN FOR NEXT SESSION: continue with advancing core strength, modalities PRN.  Assess response to TrDN.    Jena Gauss, PT 05/28/2023, 4:52 PM

## 2023-05-30 ENCOUNTER — Encounter: Payer: Self-pay | Admitting: Physical Therapy

## 2023-05-30 ENCOUNTER — Ambulatory Visit: Payer: Medicaid Other | Admitting: Physical Therapy

## 2023-05-30 DIAGNOSIS — R252 Cramp and spasm: Secondary | ICD-10-CM

## 2023-05-30 DIAGNOSIS — M5459 Other low back pain: Secondary | ICD-10-CM | POA: Diagnosis not present

## 2023-05-30 DIAGNOSIS — M6281 Muscle weakness (generalized): Secondary | ICD-10-CM

## 2023-05-30 NOTE — Therapy (Signed)
OUTPATIENT PHYSICAL THERAPY THORACOLUMBAR TREATMENT   Patient Name: AMDREA SAKER MRN: [id number] DOB:[birth year], 52 y.o., female Today's Date: 05/30/2023  END OF SESSION:  PT End of Session - 05/30/23 1409     Visit Number 8    Date for PT Re-Evaluation 06/18/23    Authorization Type [employer] Medicaid    PT Start Time 1408    PT Stop Time 1448    PT Time Calculation (min) 40 min    Activity Tolerance Patient tolerated treatment well    Behavior During Therapy [facility] for tasks assessed/performed               Past Medical History:  Diagnosis Date   Asthma    Depression with anxiety 04/20/2021   Referral source Dr Amada Kingfisher bonsu   Diarrhea    referral source   Hyperlipidemia    referral source   Insomnia 04/20/2021   referral source Dr Christin Fudge   Received intravenous tissue plasminogen activator (tPA) in emergency department 10/24/2021   Stroke [facility])    Dec, 2021, Jan 2022   Past Surgical History:  Procedure Laterality Date   APPENDECTOMY  1992   left shoulder surgery     referral source Dr Bari Mantis   PARTIAL HYSTERECTOMY     2009   Patient Active Problem List   Diagnosis Date Noted   Chronic bilateral low back pain without sciatica 04/08/2023   Adrenal mass (HCC) 04/03/2023   Chronic migraine w/o aura w/o status migrainosus, not intractable 03/27/2023   Complicated migraine 01/04/2023   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   Stroke-like symptoms 12/27/2022   B12 deficiency 07/10/2022   Cerebrovascular accident (CVA) (HCC) 06/12/2022   Atypical chest pain 06/12/2022   Leg length discrepancy 06/12/2022   Weight loss 06/12/2022   Chronic nausea 04/16/2022   Chronic diarrhea 04/16/2022   History of CVA (cerebrovascular accident) 04/16/2022   Hot flashes 04/16/2022   History of migraine headaches 09/22/2021   Insomnia 09/22/2021   Chronic left shoulder pain 06/15/2020   Bipolar I disorder, single manic episode (HCC) 12/12/2012   Depression with  anxiety 12/12/2012   Anxiety state 10/14/2012    PCP: Sandford Craze, NP   REFERRING PROVIDER: Sandford Craze, NP   REFERRING DIAG: M54.50,G89.29 (ICD-10-CM) - Chronic bilateral low back pain without sciatica   Rationale for Evaluation and Treatment: Rehabilitation  THERAPY DIAG:  Other low back pain  Cramp and spasm  Muscle weakness (generalized)  ONSET DATE: chronic since age 76  SUBJECTIVE:  SUBJECTIVE STATEMENT: Better than the other day than    PERTINENT HISTORY:  Chronic back pain, stroke symptoms, migraine, adrenal mass, Bipolar I, Depression and Anxiety, leg length discrepancy   PAIN:  Are you having pain? Yes: NPRS scale: 8/10 Pain location: Left side low back  Pain description: aching, stiff pain Aggravating factors: standing walking, prolonged sitting, laying down Relieving factors: vicodin  PRECAUTIONS: None  RED FLAGS: None   WEIGHT BEARING RESTRICTIONS: No  FALLS:  Has patient fallen in last 6 months? 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 Otherwise, alignment is normal. There are no acute fractures or subluxations. No lytic or blastic lesions.   IMPRESSION: Mild scoliosis.  No evidence for acute  abnormality.  PATIENT SURVEYS:  Modified Oswestry 33/50   COGNITION: Overall cognitive status: Within functional limits for tasks assessed     SENSATION: Light touch: Impaired   along L L45 dermatome.   MUSCLE LENGTH: Hamstrings: Right ~ 60 deg; Left ~ 60 deg Quads (prone knee bend: Right - mild tightness; Left - significant tightness, increased pain in back   POSTURE: decreased lumbar lordosis and weight shift right  PALPATION: Tenderness L4/5, L SIJ, L lumbar paraspinals, QL, glutes, muscle atrophy L erector spinale  LUMBAR ROM:   AROM eval  Flexion To knees, P!  Extension Limited 90% p!  Right lateral flexion To knees, p!  Left lateral flexion Immediate p!  Right rotation 50%  Left rotation 50%   (Blank rows = not tested)  LOWER EXTREMITY ROM:   decreased R hip ER compared to L, but otherwise WNL   LOWER EXTREMITY MMT:    MMT Right eval Left eval  Hip flexion 4+ 4  Hip extension 4+ 4  Hip abduction 4+ 4  Hip adduction 4+ 4  Knee flexion    Knee extension 4+ 4+  Ankle dorsiflexion 5 5  Ankle plantarflexion 5 5   (Blank rows = not tested)  LUMBAR SPECIAL TESTS:  Straight leg raise test: Negative and FABER test: Positive left for back pain  FUNCTIONAL TESTS:  Gait speed 71m/s  GAIT: Distance walked: 50; Assistive device utilized: None Level of assistance: Complete Independence Comments: w/s right, slight scoliosis with L hip higher than R.   TODAY'S TREATMENT:                                                                                                                              DATE:   05/30/23 Manual Therapy: to decrease muscle spasm and pain and improve mobility STM/TPR to L glutes, lumbar erector spinae, QL, L UPA mobs, cross friction massage to  L SIJ, skilled palpation and monitoring during dry needling. Trigger Point Dry-Needling  Treatment instructions: Expect mild to moderate muscle soreness. S/S of pneumothorax if dry needled over a lung field, and to seek immediate medical attention should they occur. Patient verbalized understanding of these instructions and education. Patient Consent Given: Yes Education handout  provided: Previously provided Muscles treated: L glute min/max/med Electrical stimulation performed: No Parameters: N/A Treatment response/outcome: Twitch Response Elicited and Palpable Increase in Muscle Length Modalities: Estim (premod) to L glutes + MHP in prone x 15 min   05/28/23 Therapeutic Exercise: to improve strength and mobility.  Demo, verbal and tactile cues throughout for technique. Nustep L4 x 3 min  Manual Therapy: to decrease muscle spasm and pain and improve mobility STM/TPR to L glutes, QL, QL stretch, cross friction L SIJ, Mobs L pelvis and P/A mobs sacrum,  IASTM with foam roller to glutes, skilled palpation and monitoring during dry needling. Trigger Point Dry-Needling  Treatment instructions: Expect mild to moderate muscle soreness. S/S of pneumothorax if dry needled over a lung field, and to seek immediate medical attention should they occur. Patient verbalized understanding of these instructions and education. Patient Consent Given: Yes Education handout provided: Yes Muscles treated: L glute min/max/med Electrical stimulation performed: No Parameters: N/A Treatment response/outcome: Twitch Response Elicited and Palpable Increase in Muscle Length Modalities: Estim (premod) to L glutes + MHP in prone x 10 min    05/21/23 Therapeutic Exercise: to improve strength and mobility.  Demo, verbal and tactile cues throughout for technique. Nustep L5 x 5 min  In supine - with MHP and TENS unit during exercises for comfort: Decompression exercise x 3 min  Shoulder Press x 10 Head press x 10 Leg lengthener x 5 each Leg press x 5 each Self Care: Set-up and care of TENS unit to low back, selecting Mode, time, intensity, electrode care, where to purchase additional electrodes.    PATIENT EDUCATION:  Education details: continue HEP as tolerated.  Person educated: Patient Education method: Explanation Education comprehension: verbalized understanding  HOME EXERCISE  PROGRAM: Access Code: [email or web address] URL: [email or web address] Date: 04/23/2023 Prepared by: Harrie Foreman  Exercises - Prone Pelvic Floor Contraction With Pillow  - 1 x daily - 7 x weekly - 3-5 min  hold - Prone Knee Flexion  - 1 x daily - 7 x weekly - 1 sets - 10 reps  Patient Education - TENS UNIT - AUVON Dual Channel TENS Unit  Decompression Exercises.   ASSESSMENT:  CLINICAL IMPRESSION: Miguel Rota reported decreased pain following last session.  Discussed today she had significant weight loss last year due to uncontrolled diarrhea, went down to 80lbs - she may have some muscle atrophy as a result and may be why having so much difficulty tolerating exercises.  Today again focused on manual therapy  with TrDN to decrease spasm followed by Estim + MHP to further decrease pain.  Miguel Rota continues to demonstrate potential for improvement and would benefit from continued skilled therapy to address impairments.      OBJECTIVE IMPAIRMENTS: Abnormal gait, decreased activity tolerance, decreased endurance, decreased mobility, difficulty walking, decreased ROM, decreased strength, increased fascial restrictions, increased muscle spasms, impaired flexibility, and pain.   ACTIVITY LIMITATIONS: carrying, lifting, bending, sitting, standing, squatting, sleeping, stairs, and locomotion level  PARTICIPATION LIMITATIONS: meal prep, cleaning, laundry, driving, shopping, community activity, and occupation  PERSONAL FACTORS: Past/current experiences, Time since onset of injury/illness/exacerbation, and 1-2 comorbidities: Chronic back pain, stroke symptoms, migraine, adrenal  mass, Bipolar I, Depression and Anxiety, leg length discrepancy  are also affecting patient's functional outcome.   REHAB POTENTIAL: Good  CLINICAL DECISION MAKING: Evolving/moderate complexity  EVALUATION COMPLEXITY: Moderate   GOALS: Goals reviewed with patient? Yes  SHORT TERM GOALS: Target date:  05/07/2023   Patient will be independent with initial HEP.  Baseline: given Goal status: MET 05/14/23   LONG TERM GOALS: Target date: 06/18/2023   Patient will be independent with advanced/ongoing HEP to improve outcomes and carryover.  Baseline:  Goal status: IN PROGRESS  2.  Patient will report at least 50% improvement in low back pain to improve QOL.  Baseline: constant severe Goal status: IN PROGRESS  3.  Patient will demonstrate improved functional strength as demonstrated by 4+/5 hip strength without increased pain. Baseline: see objective Goal status: IN PROGRESS  4.  Patient will report at least 6 points improvement on modified Oswestry to demonstrate improved functional ability.  Baseline: 33/50 Goal status: IN PROGRESS   5.  Patient will report 75% improvement in sleep disturbance to improve QOL. Baseline: tosses and turns all night due to pain Goal status: IN PROGRESS  6. Patient will report expected outcome on FOTO Baseline: TBD Goal status: IN PROGRESS   PLAN:  PT FREQUENCY: 1-2x/week  PT DURATION: 8 weeks  PLANNED INTERVENTIONS: Therapeutic exercises, Therapeutic activity, Neuromuscular re-education, Balance training, Gait training, Patient/Family education, Self Care, Joint mobilization, Joint manipulation, Orthotic/Fit training, Dry Needling, Electrical stimulation, Spinal manipulation, Spinal mobilization, Cryotherapy, Moist heat, Taping, Traction, Ultrasound, Ionotophoresis 4mg /ml Dexamethasone, Manual therapy, and Re-evaluation.  PLAN FOR NEXT SESSION: continue with progressing core strength as tolerated (gentle, slow), modalities PRN.     Jena Gauss, PT 05/30/2023, 3:04 PM

## 2023-06-01 ENCOUNTER — Other Ambulatory Visit: Payer: Self-pay | Admitting: Family

## 2023-06-04 ENCOUNTER — Ambulatory Visit: Payer: Medicaid Other | Attending: Family | Admitting: Physical Therapy

## 2023-06-04 ENCOUNTER — Encounter: Payer: Self-pay | Admitting: Physical Therapy

## 2023-06-04 ENCOUNTER — Telehealth: Payer: Self-pay | Admitting: Family

## 2023-06-04 DIAGNOSIS — R252 Cramp and spasm: Secondary | ICD-10-CM | POA: Insufficient documentation

## 2023-06-04 DIAGNOSIS — M545 Low back pain, unspecified: Secondary | ICD-10-CM

## 2023-06-04 DIAGNOSIS — M5459 Other low back pain: Secondary | ICD-10-CM | POA: Diagnosis present

## 2023-06-04 DIAGNOSIS — M6281 Muscle weakness (generalized): Secondary | ICD-10-CM | POA: Diagnosis present

## 2023-06-04 NOTE — Therapy (Signed)
OUTPATIENT PHYSICAL THERAPY THORACOLUMBAR TREATMENT   Patient Name: Misty Beck MRN: [id number] DOB:12-01-1970, 52 y.o., female Today's Date: 06/04/2023  END OF SESSION:  PT End of Session - 06/04/23 1327     Visit Number 9    Date for PT Re-Evaluation 06/18/23    Authorization Type [employer] Medicaid    PT Start Time 1315    PT Stop Time 1417    PT Time Calculation (min) 62 min    Activity Tolerance Patient tolerated treatment well    Behavior During Therapy [facility] for tasks assessed/performed                Past Medical History:  Diagnosis Date   Asthma    Depression with anxiety 04/20/2021   Referral source Dr Amada Kingfisher bonsu   Diarrhea    referral source   Hyperlipidemia    referral source   Insomnia 04/20/2021   referral source Dr Christin Fudge   Received intravenous tissue plasminogen activator (tPA) in emergency department 10/24/2021   Stroke [facility])    Dec, 2021, Jan 2022   Past Surgical History:  Procedure Laterality Date   APPENDECTOMY  1992   left shoulder surgery     referral source Dr Bari Mantis   PARTIAL HYSTERECTOMY     2009   Patient Active Problem List   Diagnosis Date Noted   Chronic bilateral low back pain without sciatica 04/08/2023   Adrenal mass (HCC) 04/03/2023   Chronic migraine w/o aura w/o status migrainosus, not intractable 03/27/2023   Complicated migraine 01/04/2023   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   Stroke-like symptoms 12/27/2022   B12 deficiency 07/10/2022   Cerebrovascular accident (CVA) (HCC) 06/12/2022   Atypical chest pain 06/12/2022   Leg length discrepancy 06/12/2022   Weight loss 06/12/2022   Chronic nausea 04/16/2022   Chronic diarrhea 04/16/2022   History of CVA (cerebrovascular accident) 04/16/2022   Hot flashes 04/16/2022   History of migraine headaches 09/22/2021   Insomnia 09/22/2021   Chronic left shoulder pain 06/15/2020   Bipolar I disorder, single manic episode (HCC) 12/12/2012   Depression with  anxiety 12/12/2012   Anxiety state 10/14/2012    PCP: Sandford Craze, NP   REFERRING PROVIDER: Sandford Craze, NP   REFERRING DIAG: M54.50,G89.29 (ICD-10-CM) - Chronic bilateral low back pain without sciatica   Rationale for Evaluation and Treatment: Rehabilitation  THERAPY DIAG:  Other low back pain  Cramp and spasm  Muscle weakness (generalized)  ONSET DATE: chronic since age 40  SUBJECTIVE:  SUBJECTIVE STATEMENT: Feels like she is getting worse, can barely sit now, but looks forward to each PT visit as does get a few hours of pain relief.  Not sure what is going on.    Has been doing exercises.    PERTINENT HISTORY:  Chronic back pain, stroke symptoms, migraine, adrenal mass, Bipolar I, Depression and Anxiety, leg length discrepancy   PAIN:  Are you having pain? Yes: NPRS scale: 9/10 Pain location: Left side low back  Pain description: aching, stiff pain Aggravating factors: standing walking, prolonged sitting, laying down Relieving factors: vicodin  PRECAUTIONS: None  RED FLAGS: None   WEIGHT BEARING RESTRICTIONS: No  FALLS:  Has patient fallen in last 6 months? 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 Otherwise, alignment is normal. There are no acute fractures or subluxations. No lytic or blastic lesions.   IMPRESSION: Mild scoliosis.  No evidence for acute  abnormality.  PATIENT SURVEYS:   Modified Oswestry 33/50   COGNITION: Overall cognitive status: Within functional limits for tasks assessed     SENSATION: Light touch: Impaired  along L L45 dermatome.   MUSCLE LENGTH: Hamstrings: Right ~ 60 deg; Left ~ 60 deg Quads (prone knee bend: Right - mild tightness; Left - significant tightness, increased pain in back   POSTURE: decreased lumbar lordosis and weight shift right  PALPATION: Tenderness L4/5, L SIJ, L lumbar paraspinals, QL, glutes, muscle atrophy L erector spinale  LUMBAR ROM:   AROM eval  Flexion To knees, P!  Extension Limited 90% p!  Right lateral flexion To knees, p!  Left lateral flexion Immediate p!  Right rotation 50%  Left rotation 50%   (Blank rows = not tested)  LOWER EXTREMITY ROM:   decreased R hip ER compared to L, but otherwise WNL   LOWER EXTREMITY MMT:    MMT Right eval Left eval  Hip flexion 4+ 4  Hip extension 4+ 4  Hip abduction 4+ 4  Hip adduction 4+ 4  Knee flexion    Knee extension 4+ 4+  Ankle dorsiflexion 5 5  Ankle plantarflexion 5 5   (Blank rows = not tested)  LUMBAR SPECIAL TESTS:  Straight leg raise test: Negative and FABER test: Positive left for back pain  FUNCTIONAL TESTS:  Gait speed 97m/s  GAIT: Distance walked: 50; Assistive device utilized: None Level of assistance: Complete Independence Comments: w/s right, slight scoliosis with L hip higher than R.   TODAY'S TREATMENT:  DATE:   06/04/23 Therapeutic Exercise: to improve strength and mobility.  Demo, verbal and tactile cues throughout for technique. Seated trunk twists - side of preference 2 x 10 Seated arm raises -R arm x 10 Self Care: Discussing concerns, POC, next steps Manual Therapy: to decrease muscle spasm and pain and improve mobility STM/TPR to L lumbar paraspinals, glutes, pin and stretch to L piriformis, L UPA  mobs Ultrasound: x 8 min to L QL 1 MHz, 1.2 w/cm2 cont to decrease inflammation/pain Modalities: Estim - premod to L erector spinae/glutes with MHP x 15 min   05/30/23 Manual Therapy: to decrease muscle spasm and pain and improve mobility STM/TPR to L glutes, lumbar erector spinae, QL, L UPA mobs, cross friction massage to L SIJ, skilled palpation and monitoring during dry needling. Trigger Point Dry-Needling  Treatment instructions: Expect mild to moderate muscle soreness. S/S of pneumothorax if dry needled over a lung field, and to seek immediate medical attention should they occur. Patient verbalized understanding of these instructions and education. Patient Consent Given: Yes Education handout provided: Previously provided Muscles treated: L glute min/max/med Electrical stimulation performed: No Parameters: N/A Treatment response/outcome: Twitch Response Elicited and Palpable Increase in Muscle Length Modalities: Estim (premod) to L glutes + MHP in prone x 15 min   05/28/23 Therapeutic Exercise: to improve strength and mobility.  Demo, verbal and tactile cues throughout for technique. Nustep L4 x 3 min  Manual Therapy: to decrease muscle spasm and pain and improve mobility STM/TPR to L glutes, QL, QL stretch, cross friction L SIJ, Mobs L pelvis and P/A mobs sacrum,  IASTM with foam roller to glutes, skilled palpation and monitoring during dry needling. Trigger Point Dry-Needling  Treatment instructions: Expect mild to moderate muscle soreness. S/S of pneumothorax if dry needled over a lung field, and to seek immediate medical attention should they occur. Patient verbalized understanding of these instructions and education. Patient Consent Given: Yes Education handout provided: Yes Muscles treated: L glute min/max/med Electrical stimulation performed: No Parameters: N/A Treatment response/outcome: Twitch Response Elicited and Palpable Increase in Muscle Length Modalities: Estim  (premod) to L glutes + MHP in prone x 10 min    PATIENT EDUCATION:  Education details: continue HEP as tolerated.  Person educated: Patient Education method: Explanation Education comprehension: verbalized understanding  HOME EXERCISE PROGRAM: Access Code: [email or web address] URL: [email or web address] Date: 04/23/2023 Prepared by: Harrie Foreman  Exercises - Prone Pelvic Floor Contraction With Pillow  - 1 x daily - 7 x weekly - 3-5 min  hold - Prone Knee Flexion  - 1 x daily - 7 x weekly - 1 sets - 10 reps  Patient Education - TENS UNIT - AUVON Dual Channel TENS Unit  Decompression Exercises.   ASSESSMENT:  CLINICAL IMPRESSION: KORY PANJWANI reports only short term relief from pain following PT, but overall pain is getting worse.  She does not think the exercises are aggravating her pain as they are very gentle.  Today trialed TMR based exercises focusing nonpainful movements on side of preference, she was able to do upper trunk twist.  Also trialed [place] to L QL for deep heat to see if helped pain.  Spoke about returning to her PCP for referral to spine specialist for next steps since pain is not improving overall.   Miguel Rota continues to demonstrate potential for improvement and would benefit from continued skilled therapy to address impairments.      OBJECTIVE IMPAIRMENTS: Abnormal gait, decreased activity tolerance, decreased endurance,  decreased mobility, difficulty walking, decreased ROM, decreased strength, increased fascial restrictions, increased muscle spasms, impaired flexibility, and pain.   ACTIVITY LIMITATIONS: carrying, lifting, bending, sitting, standing, squatting, sleeping, stairs, and locomotion level  PARTICIPATION LIMITATIONS: meal prep, cleaning, laundry, driving, shopping, community activity, and occupation  PERSONAL FACTORS: Past/current experiences, Time since onset of injury/illness/exacerbation, and 1-2 comorbidities: Chronic back pain, stroke  symptoms, migraine, adrenal mass, Bipolar I, Depression and Anxiety, leg length discrepancy  are also affecting patient's functional outcome.   REHAB POTENTIAL: Good  CLINICAL DECISION MAKING: Evolving/moderate complexity  EVALUATION COMPLEXITY: Moderate   GOALS: Goals reviewed with patient? Yes  SHORT TERM GOALS: Target date: 05/07/2023   Patient will be independent with initial HEP.  Baseline: given Goal status: MET 05/14/23   LONG TERM GOALS: Target date: 06/18/2023   Patient will be independent with advanced/ongoing HEP to improve outcomes and carryover.  Baseline:  Goal status: IN PROGRESS  2.  Patient will report at least 50% improvement in low back pain to improve QOL.  Baseline: constant severe Goal status: IN PROGRESS  3.  Patient will demonstrate improved functional strength as demonstrated by 4+/5 hip strength without increased pain. Baseline: see objective Goal status: IN PROGRESS  4.  Patient will report at least 6 points improvement on modified Oswestry to demonstrate improved functional ability.  Baseline: 33/50 Goal status: IN PROGRESS   5.  Patient will report 75% improvement in sleep disturbance to improve QOL. Baseline: tosses and turns all night due to pain Goal status: IN PROGRESS  6. Patient will report expected outcome on FOTO Baseline: TBD Goal status: NOT MET   PLAN:  PT FREQUENCY: 1-2x/week  PT DURATION: 8 weeks  PLANNED INTERVENTIONS: Therapeutic exercises, Therapeutic activity, Neuromuscular re-education, Balance training, Gait training, Patient/Family education, Self Care, Joint mobilization, Joint manipulation, Orthotic/Fit training, Dry Needling, Electrical stimulation, Spinal manipulation, Spinal mobilization, Cryotherapy, Moist heat, Taping, Traction, Ultrasound, Ionotophoresis 4mg /ml Dexamethasone, Manual therapy, and Re-evaluation.  PLAN FOR NEXT SESSION: continue with progressing core strength as tolerated (gentle, slow),  modalities PRN.     Jena Gauss, PT 06/04/2023, 3:12 PM

## 2023-06-04 NOTE — Telephone Encounter (Signed)
-----   Message from Jena Gauss sent at 06/04/2023  2:07 PM EST ----- Regarding: referral? Misty Beck reports no improvement in low back pain with PT.  We provide short term relief, so she looks forward to sessions, but overall her pain is getting worse.  I'd suggest a referral to spine specialist for next steps or pain management at this point.  Thank you for your help,   Daryl Eastern, PT

## 2023-06-05 ENCOUNTER — Other Ambulatory Visit: Payer: Self-pay | Admitting: Family

## 2023-06-06 ENCOUNTER — Encounter: Payer: Self-pay | Admitting: Physical Therapy

## 2023-06-06 ENCOUNTER — Ambulatory Visit: Payer: Medicaid Other | Admitting: Physical Therapy

## 2023-06-06 DIAGNOSIS — R252 Cramp and spasm: Secondary | ICD-10-CM

## 2023-06-06 DIAGNOSIS — M5459 Other low back pain: Secondary | ICD-10-CM | POA: Diagnosis not present

## 2023-06-06 DIAGNOSIS — M6281 Muscle weakness (generalized): Secondary | ICD-10-CM

## 2023-06-06 NOTE — Therapy (Addendum)
OUTPATIENT PHYSICAL THERAPY THORACOLUMBAR TREATMENT/ Discharge summary Progress Note Reporting Period 04/23/23 to 06/06/23  See note below for Objective Data and Assessment of Progress/Goals.      Patient Name: Misty Beck MRN: [id number] DOB:08-21-1970, 52 y.o., female Today's Date: 06/06/2023  END OF SESSION:  PT End of Session - 06/06/23 1331     Visit Number 10    Date for PT Re-Evaluation 06/18/23    Authorization Type [employer]    PT Start Time 1317    PT Stop Time 1410    PT Time Calculation (min) 53 min    Activity Tolerance Patient tolerated treatment well    Behavior During Therapy [facility] for tasks assessed/performed                Past Medical History:  Diagnosis Date   Asthma    Depression with anxiety 04/20/2021   Referral source Dr Amada Kingfisher bonsu   Diarrhea    referral source   Hyperlipidemia    referral source   Insomnia 04/20/2021   referral source Dr Christin Fudge   Received intravenous tissue plasminogen activator (tPA) in emergency department 10/24/2021   Stroke [facility])    Dec, 2021, Jan 2022   Past Surgical History:  Procedure Laterality Date   APPENDECTOMY  1992   left shoulder surgery     referral source Dr Bari Mantis   PARTIAL HYSTERECTOMY     2009   Patient Active Problem List   Diagnosis Date Noted   Chronic bilateral low back pain without sciatica 04/08/2023   Adrenal mass (HCC) 04/03/2023   Chronic migraine w/o aura w/o status migrainosus, not intractable 03/27/2023   Complicated migraine 01/04/2023   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   Stroke-like symptoms 12/27/2022   B12 deficiency 07/10/2022   Cerebrovascular accident (CVA) (HCC) 06/12/2022   Atypical chest pain 06/12/2022   Leg length discrepancy 06/12/2022   Weight loss 06/12/2022   Chronic nausea 04/16/2022   Chronic diarrhea 04/16/2022   History of CVA (cerebrovascular accident) 04/16/2022   Hot flashes 04/16/2022   History of migraine headaches 09/22/2021    Insomnia 09/22/2021   Chronic left shoulder pain 06/15/2020   Bipolar I disorder, single manic episode (HCC) 12/12/2012   Depression with anxiety 12/12/2012   Anxiety state 10/14/2012    PCP: Sandford Craze, NP   REFERRING PROVIDER: Sandford Craze, NP   REFERRING DIAG: M54.50,G89.29 (ICD-10-CM) - Chronic bilateral low back pain without sciatica   Rationale for Evaluation and Treatment: Rehabilitation  THERAPY DIAG:  Other low back pain  Cramp and spasm  Muscle weakness (generalized)  ONSET DATE: chronic since age 2  SUBJECTIVE:  SUBJECTIVE STATEMENT: Same today, made sure to not over do, just take frequent movement breaks while studying today.  But pain is still excruciating, had trouble walking from car to waiting room.     PERTINENT HISTORY:  Chronic back pain, stroke symptoms, migraine, adrenal mass, Bipolar I, Depression and Anxiety, leg length discrepancy   PAIN:  Are you having pain? Yes: NPRS scale: 8/10 Pain location: Left side low back  Pain description: aching, stiff pain Aggravating factors: standing walking, prolonged sitting, laying down Relieving factors: vicodin  PRECAUTIONS: None  RED FLAGS: None   WEIGHT BEARING RESTRICTIONS: No  FALLS:  Has patient fallen in last 6 months? 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 Otherwise, alignment is normal. There are no acute fractures or  subluxations. No lytic or blastic lesions.   IMPRESSION: Mild scoliosis.  No evidence for acute  abnormality.  PATIENT SURVEYS:  Modified Oswestry 33/50   COGNITION: Overall cognitive status: Within functional limits for tasks assessed     SENSATION: Light touch: Impaired  along L L45 dermatome.   MUSCLE LENGTH: Hamstrings: Right ~ 60 deg; Left ~ 60 deg Quads (prone knee bend: Right - mild tightness; Left - significant tightness, increased pain in back   POSTURE: decreased lumbar lordosis and weight shift right  PALPATION: Tenderness L4/5, L SIJ, L lumbar paraspinals, QL, glutes, muscle atrophy L erector spinale  LUMBAR ROM:   AROM eval  Flexion To knees, P!  Extension Limited 90% p!  Right lateral flexion To knees, p!  Left lateral flexion Immediate p!  Right rotation 50%  Left rotation 50%   (Blank rows = not tested)  LOWER EXTREMITY ROM:   decreased R hip ER compared to L, but otherwise WNL   LOWER EXTREMITY MMT:    MMT Right eval Left eval  Hip flexion 4+ 4  Hip extension 4+ 4  Hip abduction 4+ 4  Hip adduction 4+ 4  Knee flexion    Knee extension 4+ 4+  Ankle dorsiflexion 5 5  Ankle plantarflexion 5 5   (Blank rows = not tested)  LUMBAR SPECIAL TESTS:  Straight leg raise test: Negative and FABER test: Positive left for back pain  FUNCTIONAL TESTS:  Gait speed 73m/s  GAIT: Distance walked: 50; Assistive device utilized: None Level of assistance: Complete Independence Comments: w/s right, slight scoliosis with L hip higher than R.   TODAY'S TREATMENT:                                                                                                                              DATE:   06/06/2023 Mechanical  Lumbar Traction x 20 min including set-up and steps up, 15 min treatment time at 30 lbs max 25 lbs min, 60 sec on/10 sec rest, 2 steps up, 2 steps down, with table at 20 deg decline, to decrease radicular symptoms and pain.   MHP to neck during  traction.  Manual Therapy: to decrease muscle spasm and pain and improve mobility STM/TPR to L lumbar paraspinals, glutes Modalities: Estim - premod to L erector spinae/glutes with MHP x 10 min    06/04/23 Therapeutic Exercise: to improve strength and mobility.  Demo, verbal and tactile cues throughout for technique. Seated trunk twists - side of preference 2 x 10 Seated arm raises -R arm x 10 Self Care: Discussing concerns, POC, next steps Manual Therapy: to decrease muscle spasm and pain and improve mobility STM/TPR to L lumbar paraspinals, glutes, pin and stretch to L piriformis, L UPA mobs Ultrasound: x 8 min to L QL 1 MHz, 1.2 w/cm2 cont to decrease inflammation/pain Modalities: Estim - premod to L erector spinae/glutes with MHP x 15 min   05/30/23 Manual Therapy: to decrease muscle spasm and pain and improve mobility STM/TPR to L glutes, lumbar erector spinae, QL, L UPA mobs, cross friction massage to L SIJ, skilled palpation and monitoring during dry needling. Trigger Point Dry-Needling  Treatment instructions: Expect mild to moderate muscle soreness. S/S of pneumothorax if dry needled over a lung field, and to seek immediate medical attention should they occur. Patient verbalized understanding of these instructions and education. Patient Consent Given: Yes Education handout provided: Previously provided Muscles treated: L glute min/max/med Electrical stimulation performed: No Parameters: N/A Treatment response/outcome: Twitch Response Elicited and Palpable Increase in Muscle Length Modalities: Estim (premod) to L glutes + MHP in prone x 15 min    PATIENT EDUCATION:  Education details: continue HEP as tolerated.  Person educated: Patient Education method: Explanation Education comprehension: verbalized understanding  HOME EXERCISE PROGRAM: Access Code: [email or web address] URL: [email or web address] Date: 04/23/2023 Prepared by: Harrie Foreman  Exercises - Prone  Pelvic Floor Contraction With Pillow  - 1 x daily - 7 x weekly - 3-5 min  hold - Prone Knee Flexion  - 1 x daily - 7 x weekly - 1 sets - 10 reps  Patient Education - TENS UNIT - AUVON Dual Channel TENS Unit  Decompression Exercises.   ASSESSMENT:  CLINICAL IMPRESSION: Misty Beck continues to report severe low back/L hip pain and limited progress with PT.   Today trialed traction to see if would help with spasms and decompress back.  Tolerated well, however unless she reports marked improvement recommend discharge next visit and referral back to PCP for next steps.     OBJECTIVE IMPAIRMENTS: Abnormal gait, decreased activity tolerance, decreased endurance, decreased mobility, difficulty walking, decreased ROM, decreased strength, increased fascial restrictions, increased muscle spasms, impaired flexibility, and pain.   ACTIVITY LIMITATIONS: carrying, lifting, bending, sitting, standing, squatting, sleeping, stairs, and locomotion level  PARTICIPATION LIMITATIONS: meal prep, cleaning, laundry, driving, shopping, community activity, and occupation  PERSONAL FACTORS: Past/current experiences, Time since onset of injury/illness/exacerbation, and 1-2 comorbidities: Chronic back pain, stroke symptoms, migraine, adrenal mass, Bipolar I, Depression and Anxiety, leg length discrepancy  are also affecting patient's functional outcome.   REHAB POTENTIAL: Good  CLINICAL DECISION MAKING: Evolving/moderate complexity  EVALUATION COMPLEXITY: Moderate   GOALS: Goals reviewed with patient? Yes  SHORT TERM GOALS: Target date: 05/07/2023   Patient will be independent with initial HEP.  Baseline: given Goal status: MET 05/14/23  LONG TERM GOALS: Target date: 06/18/2023   Patient will be independent with advanced/ongoing HEP to improve outcomes and carryover.  Baseline:  Goal status: IN PROGRESS  06/06/23- met for current   2.  Patient will report at least 50% improvement in low back pain to  improve QOL.  Baseline: constant severe Goal status: NOT MET 06/06/23- continues to have severe pain   3.  Patient will demonstrate improved functional strength as demonstrated by 4+/5 hip strength without increased pain. Baseline: see objective Goal status: IN PROGRESS  11/7/4- all movements still painful  4.  Patient will report at least 6 points improvement on modified Oswestry to demonstrate improved functional ability.  Baseline: 33/50 Goal status: NOT MET 31/50   5.  Patient will report 75% improvement in sleep disturbance to improve QOL. Baseline: tosses and turns all night due to pain Goal status: NOT MET 06/06/2023- continues to wake due to L hip pain at night   6. Patient will report expected outcome on FOTO Baseline: TBD Goal status: NOT MET not taken   PLAN:  PT FREQUENCY: 1-2x/week  PT DURATION: 8 weeks  PLANNED INTERVENTIONS: Therapeutic exercises, Therapeutic activity, Neuromuscular re-education, Balance training, Gait training, Patient/Family education, Self Care, Joint mobilization, Joint manipulation, Orthotic/Fit training, Dry Needling, Electrical stimulation, Spinal manipulation, Spinal mobilization, Cryotherapy, Moist heat, Taping, Traction, Ultrasound, Ionotophoresis 4mg /ml Dexamethasone, Manual therapy, and Re-evaluation.  PLAN FOR NEXT SESSION: assess response to traction, D/C to HEP if no improvement.    Jena Gauss, PT 06/06/2023, 3:26 PM   PHYSICAL THERAPY DISCHARGE SUMMARY  Visits from Start of Care: 10  Current functional level related to goals / functional outcomes: NA - no improvement   Remaining deficits: Continued severe LBP   Education / Equipment: HEP  Plan: Patient agrees to discharge.  Patient goals were not met. Recommended contact PCP for next steps due to lack of progress.  Patient did not return after this last visit on 06/06/2023, she did return to PCP who ordered MRI and referred her to neurosurgeon.    Jena Gauss, PT, DPT  08/21/2023 3:09 PM

## 2023-06-13 ENCOUNTER — Ambulatory Visit: Payer: Medicaid Other

## 2023-06-13 ENCOUNTER — Other Ambulatory Visit: Payer: Self-pay

## 2023-06-13 MED ORDER — PROMETHAZINE HCL 12.5 MG PO TABS: 12.5000 mg | ORAL_TABLET | Freq: Three times a day (TID) | ORAL | 0 refills | Status: DC | PRN

## 2023-06-14 ENCOUNTER — Ambulatory Visit (INDEPENDENT_AMBULATORY_CARE_PROVIDER_SITE_OTHER): Payer: Medicaid Other | Admitting: Family

## 2023-06-14 ENCOUNTER — Encounter: Payer: Self-pay | Admitting: Family

## 2023-06-14 VITALS — BP 101/58 | HR 64 | Temp 97.8°F | Resp 16 | Ht 61.0 in | Wt 110.0 lb

## 2023-06-14 DIAGNOSIS — Z8673 Personal history of transient ischemic attack (TIA), and cerebral infarction without residual deficits: Secondary | ICD-10-CM

## 2023-06-14 DIAGNOSIS — G43709 Chronic migraine without aura, not intractable, without status migrainosus: Secondary | ICD-10-CM

## 2023-06-14 DIAGNOSIS — E538 Deficiency of other specified B group vitamins: Secondary | ICD-10-CM | POA: Diagnosis not present

## 2023-06-14 DIAGNOSIS — Z1159 Encounter for screening for other viral diseases: Secondary | ICD-10-CM

## 2023-06-14 DIAGNOSIS — Z8669 Personal history of other diseases of the nervous system and sense organs: Secondary | ICD-10-CM | POA: Diagnosis not present

## 2023-06-14 DIAGNOSIS — F418 Other specified anxiety disorders: Secondary | ICD-10-CM

## 2023-06-14 DIAGNOSIS — D649 Anemia, unspecified: Secondary | ICD-10-CM

## 2023-06-14 DIAGNOSIS — G4700 Insomnia, unspecified: Secondary | ICD-10-CM

## 2023-06-14 DIAGNOSIS — R634 Abnormal weight loss: Secondary | ICD-10-CM

## 2023-06-14 DIAGNOSIS — E278 Other specified disorders of adrenal gland: Secondary | ICD-10-CM

## 2023-06-14 DIAGNOSIS — M545 Low back pain, unspecified: Secondary | ICD-10-CM | POA: Diagnosis not present

## 2023-06-14 DIAGNOSIS — Z1231 Encounter for screening mammogram for malignant neoplasm of breast: Secondary | ICD-10-CM

## 2023-06-14 LAB — CBC WITH DIFFERENTIAL/PLATELET
Basophils Absolute: 0.1 10*3/uL (ref 0.0–0.1)
Basophils Relative: 1 % (ref 0.0–3.0)
Eosinophils Absolute: 0.2 10*3/uL (ref 0.0–0.7)
Eosinophils Relative: 3.2 % (ref 0.0–5.0)
HCT: 37.1 % (ref 36.0–46.0)
Hemoglobin: 12.3 g/dL (ref 12.0–15.0)
Lymphocytes Relative: 22.6 % (ref 12.0–46.0)
Lymphs Abs: 1.2 10*3/uL (ref 0.7–4.0)
MCHC: 33.1 g/dL (ref 30.0–36.0)
MCV: 92.3 fL (ref 78.0–100.0)
Monocytes Absolute: 0.4 10*3/uL (ref 0.1–1.0)
Monocytes Relative: 8 % (ref 3.0–12.0)
Neutro Abs: 3.4 10*3/uL (ref 1.4–7.7)
Neutrophils Relative %: 65.2 % (ref 43.0–77.0)
Platelets: 279 10*3/uL (ref 150.0–400.0)
RBC: 4.02 Mil/uL (ref 3.87–5.11)
RDW: 13.7 % (ref 11.5–15.5)
WBC: 5.2 10*3/uL (ref 4.0–10.5)

## 2023-06-14 LAB — VITAMIN B12: Vitamin B-12: 532 pg/mL (ref 211–911)

## 2023-06-14 MED ORDER — CYANOCOBALAMIN 1000 MCG/ML IJ SOLN: 1000.0000 ug | INTRAMUSCULAR | 5 refills | Status: DC

## 2023-06-14 MED ORDER — GABAPENTIN 300 MG PO CAPS: 300.0000 mg | ORAL_CAPSULE | Freq: Three times a day (TID) | ORAL | 5 refills | Status: DC

## 2023-06-14 NOTE — Patient Instructions (Addendum)
VISIT SUMMARY:  During today's visit, we discussed your worsening back pain, ongoing migraines, new skin lesion, sleep difficulties, recent weight gain, and overall mental health. We reviewed your current medications and made plans for further evaluation and management of your symptoms.  YOUR PLAN:  -LOW BACK PAIN: Low back pain can be caused by various factors, including muscle strain, disc problems, or other underlying conditions. We will order an MRI of your lumbar spine to better understand the cause of your pain and guide further treatment.  -SKIN LESION: A skin lesion is an abnormal growth or appearance on the skin. We examined the new lesion you mentioned and will monitor its changes. If necessary, further evaluation or treatment will be planned.  -MIGRAINE: Migraines are severe headaches often accompanied by other symptoms like nausea and sensitivity to light. You are currently waiting for prescriptions for Aimovig and Nurtec. Please contact [facility] to check on the status of these refills.  -WEIGHT GAIN: Weight gain can result from various factors, including dietary habits. You mentioned being satisfied with your current weight. Continue to maintain healthy eating habits to support your overall well-being.  -DEPRESSION/ANXIETY: Depression and anxiety are mental health conditions that can affect your mood and daily functioning. Your mood appears stable on your current medication, Zoloft. Continue taking Zoloft as prescribed.  -INSOMNIA: Insomnia is difficulty falling or staying asleep. You are currently taking Trazodone and Melatonin. Continue with your current regimen, but do not increase the Trazodone dosage due to potential interactions with your other medication, Sertraline.  -MEDICATION MANAGEMENT: You are using Lorazepam a couple of times a week during the day. Continue to use Lorazepam as needed.  -GENERAL HEALTH MAINTENANCE: We will order a carotid ultrasound as part  of your TIA/stroke workup and plan a follow-up adrenal protocol CT or MRI examination on 03/23/2024 to assess the stability of your adrenal nodule. Continue with your current medications and contact the pharmacy for any refills.  INSTRUCTIONS:  Please schedule an MRI of your lumbar spine to evaluate your back pain. Contact [facility] to check on the status of your Aimovig and Nurtec prescriptions. Continue with your current medications and follow up with any necessary refills. We will also schedule a carotid ultrasound and a follow-up adrenal protocol CT or MRI examination on 03/23/2024.

## 2023-06-14 NOTE — Assessment & Plan Note (Signed)
Still having migraines.  Will fill the aimovig at her pharmacy.

## 2023-06-14 NOTE — Assessment & Plan Note (Signed)
Uncontrolled. I advised her against increase in trazodone. OK to continue use of prn melatonin.

## 2023-06-14 NOTE — Assessment & Plan Note (Signed)
Noted on MRI, will need repeat imaging 8/25.

## 2023-06-14 NOTE — Progress Notes (Signed)
Subjective:     Patient ID: Misty Beck, female    DOB: [birth year], 52 y.o.   MRN: [id number]  Chief Complaint  Patient presents with   Back Pain    Complains of back pain getting worse   Hip Pain    Complains of pain on left hip    HPI  Discussed the use of AI scribe software for clinical note transcription with the patient, who gave verbal consent to proceed.  History of Present Illness    The patient, with a history of migraines, irritable bowel syndrome, and anxiety, presents with worsening back pain despite physical therapy. She describes the pain as 'protruding out' and 'like knots.' The pain is so severe that she is unable to sleep on her left side. She has been attending physical therapy since the end of September, but reports that the pain has worsened since starting therapy.  In addition to her back pain, the patient reports ongoing migraines. She is currently waiting on several prescriptions, including Aimovig and Nurtec, which she hopes will help manage her migraines. She also reports a new skin lesion, which she describes as 'weird' and 'getting bigger' on her right thigh.  The patient also mentions that she has been struggling with sleep, despite taking trazodone and melatonin. She requests an increase in her trazodone dosage.  Finally, the patient reports a recent weight gain, which she attributes to increased food intake. She expresses satisfaction with her current weight, stating that she has not been at this weight for a long time.         Health Maintenance Due  Topic Date Due   Hepatitis C Screening  Never done   DTaP/Tdap/Td (1 - Tdap) Never done    Past Medical History:  Diagnosis Date   Asthma    Depression with anxiety 04/20/2021   Referral source Dr Amada Kingfisher bonsu   Diarrhea    referral source   Hyperlipidemia    referral source   Insomnia 04/20/2021   referral source Dr Christin Fudge   Received intravenous tissue plasminogen activator  (tPA) in emergency department 10/24/2021   Stroke [facility])    Dec, 2021, Jan 2022    Past Surgical History:  Procedure Laterality Date   APPENDECTOMY  1992   left shoulder surgery     referral source Dr Bari Mantis   TOTAL VAGINAL HYSTERECTOMY  2009    Family History  Problem Relation Age of Onset   Stroke Mother    Cancer Mother    Colon polyps Mother    Skin cancer Mother    Breast cancer Mother 107   Kidney disease Father    Hyperlipidemia Father    COPD Father    Heart disease Father    Colon cancer Neg Hx    Rectal cancer Neg Hx    Stomach cancer Neg Hx     Social History   Socioeconomic History   Marital status: Divorced    Spouse name: Not on file   Number of children: 2   Years of education: Not on file   Highest education level: Not on file  Occupational History   Not on file  Tobacco Use   Smoking status: Former    Types: Cigarettes   Smokeless tobacco: Never  Substance and Sexual Activity   Alcohol use: Not Currently   Drug use: Not Currently   Sexual activity: Not on file  Other Topics Concern   Not on file  Social History Narrative  Not on file   Social Determinants of Health   Financial Resource Strain: Not on file  Food Insecurity: Low Risk  (02/25/2023)   Received from [facility]   Hunger Vital Sign    Worried About Running Out of Food in the Last Year: Never true    Ran Out of Food in the Last Year: Never true  Transportation Needs: Not on file (02/25/2023)  Physical Activity: Not on file  Stress: Not on file  Social Connections: Not on file  Intimate Partner Violence: Not on file    Outpatient Medications Prior to Visit  Medication Sig Dispense Refill   aspirin EC 325 MG tablet Take 1 tablet (325 mg total) by mouth daily.     aspirin-acetaminophen-caffeine (EXCEDRIN MIGRAINE) 250-250-65 MG tablet Take 2 tablets by mouth 3 (three) times daily as needed for headache or migraine.     atorvastatin (LIPITOR) 40 MG tablet Take 1 tablet  (40 mg total) by mouth daily. 30 tablet 2   cyclobenzaprine (FLEXERIL) 10 MG tablet Take 1 tablet (10 mg total) by mouth every three (3) days as needed for muscle spasms. 60 tablet 3   diclofenac Sodium (VOLTAREN) 1 % GEL APPLY 2 GRAMS TOPICALLY 4 TIMES DAILY 400 g 1   diphenoxylate-atropine (LOMOTIL) 2.5-0.025 MG tablet TAKE 1 TABLET BY MOUTH 4 TIMES A DAY AS NEEDED FOR UP TO 30 DOSES FOR DIARRHEA OR LOOSE STOOLS 30 tablet 0   Erenumab-aooe (AIMOVIG) 70 MG/ML SOAJ Inject 70 mg into the skin every 30 (thirty) days. 1.12 mL 11   LORazepam (ATIVAN) 0.5 MG tablet TAKE 1 TABLET BY MOUTH EVERY 8 HOURS AS NEEDED FOR ANXIETY 30 tablet 0   Melatonin 10 MG TABS Take 10 mg by mouth at bedtime as needed (sleep).     ondansetron (ZOFRAN-ODT) 4 MG disintegrating tablet PLACE 1 TABLET BY MOUTH EVERY 8 HOURS AS NEEDED FOR NAUSEA AND/OR VOMITING 30 tablet 0   promethazine (PHENERGAN) 12.5 MG tablet Take 1 tablet (12.5 mg total) by mouth every 8 (eight) hours as needed for nausea or vomiting. 20 tablet 0   propranolol ER (INDERAL LA) 60 MG 24 hr capsule Take 1 capsule (60 mg total) by mouth daily. 30 capsule 11   Rimegepant Sulfate (NURTEC) 75 MG TBDP Take 1 tab at onset of migraine.  May repeat in 2 hrs, if needed.  Max dose: 2 tabs/day. This is a 30 day prescription. 12 tablet 11   sertraline (ZOLOFT) 100 MG tablet Take 1.5 tablets (150 mg total) by mouth daily. 135 tablet 1   traZODone (DESYREL) 100 MG tablet TAKE 1 TABLET BY MOUTH EVERY NIGHT AT BEDTIME AS NEEDED FOR SLEEP 90 tablet 0   cyanocobalamin (VITAMIN B12) 1000 MCG/ML injection Inject 1 mL (1,000 mcg total) into the muscle every 30 (thirty) days. 4 mL 4   Eluxadoline (VIBERZI) 100 MG TABS Take 1 tablet (100 mg total) by mouth 2 (two) times daily. 180 tablet 0   gabapentin (NEURONTIN) 100 MG capsule TAKE ONE CAPSULE BY MOUTH THREE TIMES A DAY 90 capsule 2   meloxicam (MOBIC) 7.5 MG tablet Take 1 tablet (7.5 mg total) by mouth daily as needed for pain. 30  tablet 2   Cyanocobalamin (VITAMIN B-12 PO) Take 1 capsule by mouth 3 (three) times a week.     No facility-administered medications prior to visit.    Allergies  Allergen Reactions   Doxycycline Other (See Comments)    Burning skin     ROS  See HPI Objective:    Physical Exam Constitutional:      General: She is not in acute distress.    Appearance: Normal appearance. She is well-developed.  HENT:     Head: Normocephalic and atraumatic.     Right Ear: External ear normal.     Left Ear: External ear normal.  Eyes:     General: No scleral icterus. Neck:     Thyroid: No thyromegaly.  Cardiovascular:     Rate and Rhythm: Normal rate and regular rhythm.     Heart sounds: Normal heart sounds. No murmur heard. Pulmonary:     Effort: Pulmonary effort is normal. No respiratory distress.     Breath sounds: Normal breath sounds. No wheezing.  Musculoskeletal:     Cervical back: Neck supple.  Skin:    General: Skin is warm and dry.     Comments: Raised benign appearing skin keratosis on right lateral upper thigh- about 1 cm long  Neurological:     Mental Status: She is alert and oriented to person, place, and time.  Psychiatric:        Mood and Affect: Mood normal.        Behavior: Behavior normal.        Thought Content: Thought content normal.        Judgment: Judgment normal.      BP (!) 101/58 (BP Location: Right Arm, Patient Position: Sitting, Cuff Size: Small)   Pulse 64   Temp 97.8 F (36.6 C) (Oral)   Resp 16   Ht 5\' 1"  (1.549 m)   Wt 110 lb (49.9 kg)   SpO2 100%   BMI 20.78 kg/m  Wt Readings from Last 3 Encounters:  06/14/23 110 lb (49.9 kg)  04/08/23 100 lb 4.8 oz (45.5 kg)  03/27/23 99 lb 6.4 oz (45.1 kg)       Assessment & Plan:   Problem List Items Addressed This Visit       Unprioritized   Weight loss    Wt Readings from Last 3 Encounters:  06/14/23 110 lb (49.9 kg)  04/08/23 100 lb 4.8 oz (45.5 kg)  03/27/23 99 lb 6.4 oz (45.1 kg)   Weight is up.  She has been working on eating more.      Insomnia    Uncontrolled. I advised her against increase in trazodone. OK to continue use of prn melatonin.       History of migraine headaches    Still having migraines.  Will fill the aimovig at her pharmacy.       History of CVA (cerebrovascular accident)    She never completed carotid doppler, will re-order.      Depression with anxiety    Stable on sertraline. Continue same. Controlled substance contract is updated.       Chronic migraine w/o aura w/o status migrainosus, not intractable    Advised pt to request fill of Aimovig and Nurtec from her pharmacy which are being prescribed by her neurologist.       Relevant Medications   gabapentin (NEURONTIN) 300 MG capsule   B12 deficiency    Stopped b12 injections due to issue with the rx from the pharmacy. Restart b12, update level.       Relevant Medications   cyanocobalamin (VITAMIN B12) 1000 MCG/ML injection   Other Relevant Orders   B12   Adrenal mass (HCC)    Noted on MRI, will need repeat imaging 8/25.       Other  Visit Diagnoses     Left-sided low back pain without sciatica, unspecified chronicity    -  Primary   Relevant Orders   MR LUMBAR SPINE WO CONTRAST   History of stroke       Relevant Orders   US Carotid Duplex Bilateral   Encounter for hepatitis C screening test for low risk patient       Relevant Orders   Hepatitis C Antibody   Breast cancer screening by mammogram       Relevant Orders   MM 3D SCREENING MAMMOGRAM BILATERAL BREAST   Anemia, unspecified type       Relevant Medications   cyanocobalamin (VITAMIN B12) 1000 MCG/ML injection   Other Relevant Orders   CBC w/Diff   Iron, TIBC and Ferritin Panel       I have discontinued Mariea Clonts Stroebel's Viberzi, meloxicam, and gabapentin. I have also changed her cyanocobalamin. Additionally, I am having her start on gabapentin. Lastly, I am having her maintain her aspirin EC, Melatonin,  Cyanocobalamin (VITAMIN B-12 PO), aspirin-acetaminophen-caffeine, atorvastatin, sertraline, Aimovig, Nurtec, cyclobenzaprine, diclofenac Sodium, traZODone, LORazepam, propranolol ER, diphenoxylate-atropine, ondansetron, and promethazine.  Meds ordered this encounter  Medications   cyanocobalamin (VITAMIN B12) 1000 MCG/ML injection    Sig: Inject 1 mL (1,000 mcg total) into the muscle every 14 (fourteen) days.    Dispense:  4 mL    Refill:  5    Order Specific Question:   Supervising Provider    Answer:   Danise Edge A [[id number]]   gabapentin (NEURONTIN) 300 MG capsule    Sig: Take 1 capsule (300 mg total) by mouth 3 (three) times daily.    Dispense:  90 capsule    Refill:  5    Order Specific Question:   Supervising Provider    Answer:   Danise Edge A [[id number]]

## 2023-06-14 NOTE — Assessment & Plan Note (Signed)
She never completed carotid doppler, will re-order.

## 2023-06-14 NOTE — Assessment & Plan Note (Addendum)
Wt Readings from Last 3 Encounters:  06/14/23 110 lb (49.9 kg)  04/08/23 100 lb 4.8 oz (45.5 kg)  03/27/23 99 lb 6.4 oz (45.1 kg)  Weight is up.  She has been working on eating more.

## 2023-06-14 NOTE — Assessment & Plan Note (Addendum)
>>  ASSESSMENT AND PLAN FOR CHRONIC MIGRAINE W/O AURA W/O STATUS MIGRAINOSUS, NOT INTRACTABLE WRITTEN ON 06/14/2023 11:38 AM BY O'SULLIVAN, Delbra Zellars, NP  Advised pt to request fill of Aimovig  and Nurtec from her pharmacy which are being prescribed by her neurologist.    >>ASSESSMENT AND PLAN FOR HISTORY OF MIGRAINE HEADACHES WRITTEN ON 06/14/2023 10:21 AM BY O'SULLIVAN, Flecia Shutter, NP  Still having migraines.  Will fill the aimovig  at her pharmacy.

## 2023-06-14 NOTE — Assessment & Plan Note (Signed)
Stopped b12 injections due to issue with the rx from the pharmacy. Restart b12, update level.

## 2023-06-14 NOTE — Assessment & Plan Note (Addendum)
Stable on sertraline. Continue same. Controlled substance contract is updated.

## 2023-06-15 ENCOUNTER — Telehealth: Payer: Self-pay | Admitting: Family

## 2023-06-15 LAB — IRON,TIBC AND FERRITIN PANEL
%SAT: 17 % (ref 16–45)
Ferritin: 7 ng/mL — ABNORMAL LOW (ref 16–232)
Iron: 60 ug/dL (ref 45–160)
TIBC: 355 ug/dL (ref 250–450)

## 2023-06-15 LAB — HEPATITIS C ANTIBODY: Hepatitis C Ab: NONREACTIVE

## 2023-06-15 MED ORDER — MULTI-VITAMIN/MINERALS PO TABS: 1.0000 | ORAL_TABLET | Freq: Every day | ORAL | Status: AC

## 2023-06-15 NOTE — Telephone Encounter (Signed)
See mychart.  

## 2023-06-26 ENCOUNTER — Ambulatory Visit (HOSPITAL_BASED_OUTPATIENT_CLINIC_OR_DEPARTMENT_OTHER)
Admission: RE | Admit: 2023-06-26 | Discharge: 2023-06-26 | Disposition: A | Discharge status: Home / Self Care | Payer: Medicaid Other | Source: Ambulatory Visit | Attending: Family | Admitting: Family

## 2023-06-26 ENCOUNTER — Encounter (HOSPITAL_BASED_OUTPATIENT_CLINIC_OR_DEPARTMENT_OTHER): Payer: Self-pay

## 2023-06-26 DIAGNOSIS — M545 Low back pain, unspecified: Secondary | ICD-10-CM | POA: Diagnosis present

## 2023-06-26 DIAGNOSIS — Z8673 Personal history of transient ischemic attack (TIA), and cerebral infarction without residual deficits: Secondary | ICD-10-CM | POA: Diagnosis present

## 2023-06-26 DIAGNOSIS — Z1231 Encounter for screening mammogram for malignant neoplasm of breast: Secondary | ICD-10-CM | POA: Insufficient documentation

## 2023-07-02 ENCOUNTER — Other Ambulatory Visit: Payer: Self-pay | Admitting: Family

## 2023-07-02 DIAGNOSIS — R928 Other abnormal and inconclusive findings on diagnostic imaging of breast: Secondary | ICD-10-CM

## 2023-07-08 ENCOUNTER — Ambulatory Visit: Payer: Medicaid Other | Admitting: Family

## 2023-07-10 ENCOUNTER — Ambulatory Visit: Payer: Medicaid Other | Admitting: Family

## 2023-07-11 ENCOUNTER — Telehealth: Payer: Self-pay | Admitting: Family

## 2023-07-11 DIAGNOSIS — M5442 Lumbago with sciatica, left side: Secondary | ICD-10-CM

## 2023-07-11 NOTE — Telephone Encounter (Signed)
MRI of her spine shows a bulging disc and pinched nerve. I would like to refer her to neurosurgery for further evaluation.

## 2023-07-11 NOTE — Telephone Encounter (Signed)
Called patient but no answer, lvm for her to call us back

## 2023-07-11 NOTE — Telephone Encounter (Signed)
Patient notified of results and referral 

## 2023-07-16 ENCOUNTER — Other Ambulatory Visit: Payer: Self-pay | Admitting: Family

## 2023-07-17 ENCOUNTER — Ambulatory Visit
Admission: RE | Admit: 2023-07-17 | Discharge: 2023-07-17 | Disposition: A | Discharge status: Home / Self Care | Payer: Medicaid Other | Source: Ambulatory Visit | Attending: Family | Admitting: Family

## 2023-07-17 ENCOUNTER — Other Ambulatory Visit: Payer: Self-pay | Admitting: Family

## 2023-07-17 ENCOUNTER — Encounter: Payer: Self-pay | Admitting: Family

## 2023-07-17 DIAGNOSIS — R928 Other abnormal and inconclusive findings on diagnostic imaging of breast: Secondary | ICD-10-CM

## 2023-07-18 NOTE — Telephone Encounter (Signed)
Are you able to check on this referral?  

## 2023-07-22 ENCOUNTER — Other Ambulatory Visit: Payer: Self-pay | Admitting: Family

## 2023-07-22 DIAGNOSIS — N6002 Solitary cyst of left breast: Secondary | ICD-10-CM

## 2023-08-07 ENCOUNTER — Other Ambulatory Visit: Payer: Medicaid Other

## 2023-08-25 ENCOUNTER — Other Ambulatory Visit: Payer: Self-pay | Admitting: Family

## 2023-08-27 ENCOUNTER — Other Ambulatory Visit: Payer: Self-pay | Admitting: Family

## 2023-08-27 NOTE — Telephone Encounter (Signed)
Requesting: lorazepam 0.5mg   Contract: 06/14/2023 UDS: 05/07/23 Last Visit: 06/14/2023 Next Visit: 12/13/2023 Last Refill: 05/14/2023 #30 no refills   Please Advise

## 2023-09-01 ENCOUNTER — Telehealth: Payer: Self-pay | Admitting: Family

## 2023-09-01 DIAGNOSIS — E278 Other specified disorders of adrenal gland: Secondary | ICD-10-CM

## 2023-09-01 NOTE — Telephone Encounter (Signed)
Please advise patient that she is due for a follow up CT scan to recheck the spot on her adrenal gland noted on CT last year. I have placed this order and she should be contacted about scheduling.

## 2023-09-02 NOTE — Telephone Encounter (Signed)
Called patient but no answer, left voice mail for patient to call back.  Ok to [employer] about CT

## 2023-09-04 NOTE — Telephone Encounter (Signed)
 Patient notified information on this message and that she will receive a call for scheduling.

## 2023-09-10 ENCOUNTER — Other Ambulatory Visit: Payer: Self-pay

## 2023-09-10 MED ORDER — ONDANSETRON 4 MG PO TBDP: 4.0000 mg | ORAL_TABLET | Freq: Once | ORAL | 0 refills | Status: AC

## 2023-09-11 ENCOUNTER — Telehealth: Payer: Self-pay

## 2023-09-11 NOTE — Telephone Encounter (Signed)
Her insurance requires her to tried and failed, or has a contraindication to, at least TWO preferred triptans, which she hasn't done

## 2023-09-11 NOTE — Telephone Encounter (Signed)
*  [facility]  Pharmacy Patient Advocate Encounter  Received notification from [facility] that Prior Authorization for Aimovig 70MG /ML auto-injectors  has been CANCELLED due to previous denial on file with no new chart notes or visits since then   PA #/Case ID/Reference #: [id number]

## 2023-09-11 NOTE — Telephone Encounter (Signed)
Pharmacy Patient Advocate Encounter  Received notification from [facility] that Prior Authorization for Nurtec 75MG  dispersible tablets  has been CANCELLED due to previous denial on file and no new visit with chart notes   PA #/Case ID/Reference #: [id number]

## 2023-09-12 NOTE — Telephone Encounter (Signed)
She presented with multiple strokelike symptoms in the past, is not a candidate for triptan treatment.  Previous office note from August 2024 clearly state above information on assessment plan, please re-file it  again

## 2023-09-17 ENCOUNTER — Other Ambulatory Visit (HOSPITAL_COMMUNITY): Payer: Self-pay

## 2023-09-17 ENCOUNTER — Telehealth: Payer: Self-pay

## 2023-09-17 NOTE — Telephone Encounter (Signed)
 PA request has been Submitted. New Encounter created for follow up. For additional info see Pharmacy Prior Auth telephone encounter from 09/17/2023.

## 2023-09-17 NOTE — Telephone Encounter (Signed)
 Pharmacy Patient Advocate Encounter   Received notification from Physician's Office that prior authorization for Nurtec 75MG  dispersible tablets is required/requested.   Insurance verification completed.   The patient is insured through [facility] MEDICAID .   Per test claim: PA required; PA submitted to above mentioned insurance via CoverMyMeds Key/confirmation #/EOC [id number] Status is pending

## 2023-09-18 ENCOUNTER — Telehealth: Payer: Self-pay | Admitting: Family

## 2023-09-18 ENCOUNTER — Ambulatory Visit (HOSPITAL_BASED_OUTPATIENT_CLINIC_OR_DEPARTMENT_OTHER)
Admission: RE | Admit: 2023-09-18 | Discharge: 2023-09-18 | Disposition: A | Discharge status: Home / Self Care | Payer: Medicaid Other | Source: Ambulatory Visit | Attending: Family | Admitting: Family

## 2023-09-18 ENCOUNTER — Other Ambulatory Visit: Payer: Self-pay | Admitting: Family

## 2023-09-18 ENCOUNTER — Other Ambulatory Visit (HOSPITAL_COMMUNITY): Payer: Self-pay

## 2023-09-18 DIAGNOSIS — E278 Other specified disorders of adrenal gland: Secondary | ICD-10-CM

## 2023-09-18 MED ORDER — IOHEXOL 300 MG/ML  SOLN
80.0000 mL | Freq: Once | INTRAMUSCULAR | Status: AC | PRN
  Administered 2023-09-18: 80 mL via INTRAVENOUS

## 2023-09-18 NOTE — Telephone Encounter (Signed)
 Pharmacy Patient Advocate Encounter  Received notification from [facility] MEDICAID that Prior Authorization for Nurtec 75MG  dispersible tablets has been APPROVED from 09/17/2023 to 09/16/2024   PA #/Case ID/Reference #: PA Case ID #: [id number]

## 2023-09-18 NOTE — Telephone Encounter (Signed)
 Copied from CRM [id number]. Topic: General - Other >> Sep 18, 2023 10:30 AM Larwance Sachs wrote: Reason for CRM: Morrie Sheldon called in regarding an order for patient appointment today stated needs confirmation to change or keep as is, please reach Morrie Sheldon back at [phone] if needed

## 2023-09-19 NOTE — Telephone Encounter (Signed)
 Ct scan scheduled for yesterday was completed

## 2023-09-27 ENCOUNTER — Other Ambulatory Visit (HOSPITAL_COMMUNITY): Payer: Self-pay

## 2023-10-02 ENCOUNTER — Other Ambulatory Visit: Payer: Self-pay | Admitting: Family

## 2023-10-07 ENCOUNTER — Encounter: Payer: Self-pay | Admitting: Family

## 2023-10-10 ENCOUNTER — Ambulatory Visit: Payer: Medicaid Other | Admitting: Adult Health

## 2023-10-10 ENCOUNTER — Encounter: Payer: Self-pay | Admitting: Adult Health

## 2023-10-10 VITALS — BP 104/60 | HR 94 | Ht 64.0 in | Wt 109.0 lb

## 2023-10-10 DIAGNOSIS — R2990 Unspecified symptoms and signs involving the nervous system: Secondary | ICD-10-CM

## 2023-10-10 DIAGNOSIS — G43E11 Chronic migraine with aura, intractable, with status migrainosus: Secondary | ICD-10-CM

## 2023-10-10 DIAGNOSIS — G4440 Drug-induced headache, not elsewhere classified, not intractable: Secondary | ICD-10-CM | POA: Diagnosis not present

## 2023-10-10 MED ORDER — ONDANSETRON 4 MG PO TBDP: 4.0000 mg | ORAL_TABLET | Freq: Three times a day (TID) | ORAL | 11 refills | Status: AC | PRN

## 2023-10-10 MED ORDER — NURTEC 75 MG PO TBDP: ORAL_TABLET | ORAL | 11 refills | Status: DC

## 2023-10-10 MED ORDER — AJOVY 225 MG/1.5ML ~~LOC~~ SOAJ: 225.0000 mg | SUBCUTANEOUS | 11 refills | Status: DC

## 2023-10-10 NOTE — Progress Notes (Signed)
 Chief Complaint  Patient presents with   Migraine    Rm 3 with spouse Pt is well, reports she has about 2-3 migraines a week. Did not get approved for injection and was unable to pick up Nurtec do to no refills. Did not notify us.  No new stroke like concerns        ASSESSMENT AND PLAN  Misty Beck is a 53 y.o. female   Chronic migraine headache  Continued headaches about 2-3 times per week  Initiate Ajovy monthly injection, initial injection provided today with education provided  Start Nurtec as needed for rescue, triptans contraindicated due to TIA history  Start Zofran 4 mg as needed for nausea Discussed limiting OTC pain reliever use as she is likely experiencing component of rebound headache Recommend discontinuing propranolol due to no benefit and hypotension Previously tried/failed: Propranolol, sertraline, gabapentin, would not recommend any other antihypertensives/beta-blockers due to hypotension, would not recommend other antidepressants due to current use of sertraline and increased risk of serotonin syndrome, would avoid Aimovig due to constipation history   Multiple recurrent strokelike symptoms, received IV thrombolytic treatment 4 times without positive MRI findings, most recent 1 was at [facility] on February 25, 2023, [facility] in May 2024,  No recurrent episodes  Can hold off on obtaining EEG (was not previously completed) - will pursue with any additional episodes     Follow-up in 6 months or call earlier if needed      DIAGNOSTIC DATA (LABS, IMAGING, TESTING) - I reviewed patient records, labs, notes, testing and imaging myself where available.   MEDICAL HISTORY:  Update 10/10/2023 JM: Patient returns for 55-month follow-up visit accompanied by her husband.  She continues to experience about 2-3 migraines per week.  Insurance required her to try propranolol prior to approval of Aimovig, she is currently on propranolol 60 mg daily but denies any  improvement.  She never received prescription of Nurtec due to initial insurance denial and just recently got approved and then miscommunication with pharmacy.  She has continued to take Excedrin on a daily basis.  Usually wakes up with either migraine or headache and gradually improves throughout the day.  Headaches can be associated with nausea.  Can occasionally have visual aura.  She is currently on sertraline for depression and gabapentin as needed for pain.  She does note difficulty sleeping at night and has daytime fatigue, does take trazodone to help with initiation of sleep.  No snoring or witnessed apneas.  Has not had any new or recurrent stroke/TIA symptoms.     Consult visit 03/19/2023 Dr. Terrace Arabia: Misty Beck is a 53 year old female, accompanied by her husband, seen in request by her primary care nurse practitioner Sandford Craze for evaluation of strokelike symptoms, initial evaluation March 27, 2023,  I reviewed and summarized the referring note. PMHX Depression, Anxiety Irritable bowel syndrome Vit B12 deificiency Chronic insomnia HLD  Hospital admission Dec 27, 2022, seen by stroke neurologist Dr. Roda Shutters, presenting with sudden onset left-sided weakness, numbness, facial drooping, telestroke assessment was performed, even with the constraining of the camera examination, there was some questionable inconsistent on exam, she ended up receiving IV thrombolysis, then later transferred to [facility] for post thrombolytic care  Left-sided symptoms last about 1 day gradually recovered MRI of the brain was normal CT angiogram head and neck showed no large vessel disease  Laboratory showed A1c 5.1, LDL 47, normal CMP, CBC showed hemoglobin of 10.5  She had 2  similar occasions in 2021 and 2022, per patient also received IV thrombolytic treatment with no residual deficit  She again presenting to [facility] on February 25, 2023 for acute onset left-sided weakness, dysarthria, evaluated  by telestroke, received TNK, with total resolve  of symptoms  MRI of the brain was normal  CT angiogram and perfusion study showed no perfusion abnormality, no intracranial vessel disease  Hemoglobin was 12.2  She complains of frequent headaches for many years, getting worse since 2023, 3 headaches each week, light noise sensitivity, taking almost daily Excedrin Migraine over past 2 years,    PHYSICAL EXAM:   Vitals:   10/10/23 1440  BP: 104/60  Pulse: 94  Weight: 109 lb (49.4 kg)  Height: 5\' 4"  (1.626 m)   Body mass index is 18.71 kg/m.  PHYSICAL EXAMNIATION:  Gen: NAD, conversant, well nourised, well groomed                     Cardiovascular: Regular rate rhythm, no peripheral edema, warm, nontender. Eyes: Conjunctivae clear without exudates or hemorrhage Neck: Supple, no carotid bruits. Pulmonary: Clear to auscultation bilaterally   NEUROLOGICAL EXAM:  MENTAL STATUS: Speech/cognition: Awake, alert, oriented to history taking and casual conversation CRANIAL NERVES: CN II: Visual fields are full to confrontation. Pupils are round equal and briskly reactive to light. CN III, IV, VI: extraocular movement are normal. No ptosis. CN V: Facial sensation is intact to light touch CN VII: Face is symmetric with normal eye closure  CN VIII: Hearing is normal to causal conversation. CN IX, X: Phonation is normal. CN XI: Head turning and shoulder shrug are intact  MOTOR: There is no pronator drift of out-stretched arms. Muscle bulk and tone are normal. Muscle strength is normal.  REFLEXES: Reflexes are 2+ and symmetric at the biceps, triceps, knees, and ankles. Plantar responses are flexor.  SENSORY: Intact to light touch, pinprick and vibratory sensation are intact in fingers and toes.  COORDINATION: There is no trunk or limb dysmetria noted.  GAIT/STANCE: Posture is normal. Gait is steady with normal steps, base, arm swing, and turning. Heel and toe walking are  normal. Tandem gait is normal.      REVIEW OF SYSTEMS:  Full 14 system review of systems performed and notable only for as above All other review of systems were negative.   ALLERGIES: Allergies  Allergen Reactions   Doxycycline Other (See Comments)    Burning skin     HOME MEDICATIONS: Current Outpatient Medications  Medication Sig Dispense Refill   aspirin EC 325 MG tablet Take 1 tablet (325 mg total) by mouth daily.     aspirin-acetaminophen-caffeine (EXCEDRIN MIGRAINE) 250-250-65 MG tablet Take 2 tablets by mouth 3 (three) times daily as needed for headache or migraine.     atorvastatin (LIPITOR) 40 MG tablet Take 1 tablet (40 mg total) by mouth daily. 30 tablet 2   Cyanocobalamin (VITAMIN B-12 PO) Take 1 capsule by mouth 3 (three) times a week.     cyclobenzaprine (FLEXERIL) 10 MG tablet Take 1 tablet (10 mg total) by mouth every three (3) days as needed for muscle spasms. 60 tablet 3   diclofenac Sodium (VOLTAREN) 1 % GEL APPLY 2 GRAMS TOPICALLY 4 TIMES DAILY 400 g 1   diphenoxylate-atropine (LOMOTIL) 2.5-0.025 MG tablet TAKE 1 TABLET BY MOUTH FOUR TIMES A DAY AS NEEDED FOR UP TO 30 DOSES FOR DIARRHEA OR LOOSE STOOLS. 30 tablet 0   Erenumab-aooe (AIMOVIG) 70 MG/ML SOAJ Inject 70  mg into the skin every 30 (thirty) days. 1.12 mL 11   gabapentin (NEURONTIN) 300 MG capsule Take 1 capsule (300 mg total) by mouth 3 (three) times daily. 90 capsule 5   LORazepam (ATIVAN) 0.5 MG tablet TAKE ONE TABLET BY MOUTH EVERY 8 HOURS AS NEEDED FOR ANXIETY 30 tablet 0   Melatonin 10 MG TABS Take 10 mg by mouth at bedtime as needed (sleep).     Multiple Vitamins-Minerals (MULTIVITAMIN WITH MINERALS) tablet Take 1 tablet by mouth daily.     promethazine (PHENERGAN) 12.5 MG tablet TAKE 1 TABLET BY MOUTH EVERY 8 HOURS AS NEEDED FOR NAUSEA AND/OR VOMITING 20 tablet 0   propranolol ER (INDERAL LA) 60 MG 24 hr capsule Take 1 capsule (60 mg total) by mouth daily. 30 capsule 11   Rimegepant Sulfate  (NURTEC) 75 MG TBDP Take 1 tab at onset of migraine.  May repeat in 2 hrs, if needed.  Max dose: 2 tabs/day. This is a 30 day prescription. 12 tablet 11   sertraline (ZOLOFT) 100 MG tablet TAKE 1 AND 1/2 TABLET BY MOUTH DAILY 135 tablet 1   traZODone (DESYREL) 100 MG tablet TAKE 1 TABLET BY MOUTH EVERY NIGHT AT BEDTIME AS NEEDED FOR SLEEP 90 tablet 0   No current facility-administered medications for this visit.    PAST MEDICAL HISTORY: Past Medical History:  Diagnosis Date   Asthma    Depression with anxiety 04/20/2021   Referral source Dr Amada Kingfisher bonsu   Diarrhea    referral source   Hyperlipidemia    referral source   Insomnia 04/20/2021   referral source Dr Christin Fudge   Received intravenous tissue plasminogen activator (tPA) in emergency department 10/24/2021   Stroke [facility])    Dec, 2021, Jan 2022    PAST SURGICAL HISTORY: Past Surgical History:  Procedure Laterality Date   APPENDECTOMY  1992   left shoulder surgery     referral source Dr Bari Mantis   TOTAL VAGINAL HYSTERECTOMY  2009    FAMILY HISTORY: Family History  Problem Relation Age of Onset   Stroke Mother    Cancer Mother    Colon polyps Mother    Skin cancer Mother    Breast cancer Mother 17   Kidney disease Father    Hyperlipidemia Father    COPD Father    Heart disease Father    Colon cancer Neg Hx    Rectal cancer Neg Hx    Stomach cancer Neg Hx     SOCIAL HISTORY: Social History   Socioeconomic History   Marital status: Divorced    Spouse name: Not on file   Number of children: 2   Years of education: Not on file   Highest education level: Not on file  Occupational History   Not on file  Tobacco Use   Smoking status: Former    Types: Cigarettes   Smokeless tobacco: Never  Substance and Sexual Activity   Alcohol use: Not Currently   Drug use: Not Currently   Sexual activity: Not on file  Other Topics Concern   Not on file  Social History Narrative   Not on file   Social  Drivers of Health   Financial Resource Strain: Not on file  Food Insecurity: Low Risk  (02/25/2023)   Received from [facility]   Hunger Vital Sign    Worried About Running Out of Food in the Last Year: Never true    Ran Out of Food in the Last Year: Never  true  Transportation Needs: Not on file (02/25/2023)  Physical Activity: Not on file  Stress: Not on file  Social Connections: Not on file  Intimate Partner Violence: Not on file      I spent 30 minutes of face-to-face and non-face-to-face time with patient and husband.  This included previsit chart review, lab review, study review, order entry, electronic health record documentation, patient education and discussion regarding above diagnoses and treatment plan and answered all other questions to patient's satisfaction  Ihor Austin, [facility]  [facility] [address] [place], [place] [address]  Phone [phone] Fax [phone] Note: This document was prepared with digital dictation and possible smart phrase technology. Any transcriptional errors that result from this process are unintentional.

## 2023-10-10 NOTE — Patient Instructions (Addendum)
 Your Plan:  Start Ajovy monthly injection   Start Nurtec as needed  Will send in Zofran to take as needed for nausea   Stop propranolol     Follow up in 6 months or call earlier if needed     Thank you for coming to see [place] at [facility]. I hope we have been able to provide you high quality care today.  You may receive a patient satisfaction survey over the next few weeks. We would appreciate your feedback and comments so that we may continue to improve ourselves and the health of our patients.

## 2023-10-11 ENCOUNTER — Other Ambulatory Visit: Payer: Self-pay | Admitting: Family

## 2023-10-14 ENCOUNTER — Telehealth: Payer: Self-pay | Admitting: Neurology

## 2023-10-14 MED ORDER — AJOVY 225 MG/1.5ML ~~LOC~~ SOAJ: 225.0000 mg | Freq: Once | SUBCUTANEOUS | Status: AC

## 2023-10-14 MED ORDER — NURTEC 75 MG PO TBDP: 75.0000 mg | ORAL_TABLET | ORAL | Status: DC | PRN

## 2023-10-14 NOTE — Telephone Encounter (Signed)
 Pt was at visit today and received a Ajovy injection for education on how to administer. Provided a Ajovy sample. Pt also requested samples to try nurtec. Was able to offer her nurtec samples.

## 2023-10-18 ENCOUNTER — Telehealth: Payer: Self-pay

## 2023-10-18 ENCOUNTER — Other Ambulatory Visit (HOSPITAL_COMMUNITY): Payer: Self-pay

## 2023-10-18 NOTE — Telephone Encounter (Signed)
 Pharmacy Patient Advocate Encounter  Received notification from [facility] MEDICAID that Prior Authorization for AJOVY (fremanezumab-vfrm) injection 225MG /1.5ML auto-injectors has been APPROVED from 10/18/2023 to 01/18/2024. Ran test claim, Copay is $4.00. This test claim was processed through [facility]- copay amounts may vary at other pharmacies due to pharmacy/plan contracts, or as the patient moves through the different stages of their insurance plan.   PA #/Case ID/Reference #: PA Case ID #: [id number]

## 2023-10-18 NOTE — Telephone Encounter (Signed)
 Pharmacy Patient Advocate Encounter   Received notification from CoverMyMeds that prior authorization for AJOVY (fremanezumab-vfrm) injection 225MG /1.5ML auto-injectors is required/requested.   Insurance verification completed.   The patient is insured through [facility] MEDICAID .   Per test claim: PA required; PA submitted to above mentioned insurance via CoverMyMeds Key/confirmation #/EOC [facility] Status is pending

## 2023-10-18 NOTE — Telephone Encounter (Signed)
 Pharmacy Patient Advocate Encounter   Received notification from CoverMyMeds that prior authorization for Nurtec 75MG  dispersible tablets is required/requested.   Insurance verification completed.   The patient is insured through [facility] MEDICAID .   Per test claim: PA required; PA submitted to above mentioned insurance via CoverMyMeds Key/confirmation #/EOC [id number] Status is pending

## 2023-11-25 ENCOUNTER — Other Ambulatory Visit: Payer: Self-pay | Admitting: Family

## 2023-11-25 NOTE — Telephone Encounter (Signed)
 Requesting: Lomotil   Contract: n/a UDS: n/a Last Visit: 06/14/23 Next Visit: 12/13/23 Last Refill: 08/25/23 #30 and 0RF   Please Advise

## 2023-11-29 ENCOUNTER — Telehealth: Payer: Self-pay | Admitting: Adult Health

## 2023-11-29 NOTE — Telephone Encounter (Signed)
 Pt calling to report headache medication has not helped, no relief.  She would like a call to discuss, she declined using my chart.

## 2023-12-02 NOTE — Telephone Encounter (Signed)
 Spoke w/Pt who states the Ajovy  and Nurtec neither have helped with her migraines. Recently when she has had a migraine, stated she goes into a completely dark room and takes Excedrin migraine. States the headache doesn't completely go away. Stated she is out of the Nurtec samples and insurance would not approve it and she has had a second dose of the Ajovy . Stated she has a migraine at least 5 days a week, not many mornings she doesn't wake up with one or one develops throughout the day. She doesn't feel the medication is working for her.

## 2023-12-13 ENCOUNTER — Ambulatory Visit: Payer: Medicaid Other | Admitting: Family

## 2023-12-13 VITALS — BP 100/64 | HR 73 | Temp 98.9°F | Resp 16 | Ht 64.0 in | Wt 108.0 lb

## 2023-12-13 DIAGNOSIS — G43709 Chronic migraine without aura, not intractable, without status migrainosus: Secondary | ICD-10-CM

## 2023-12-13 DIAGNOSIS — G4700 Insomnia, unspecified: Secondary | ICD-10-CM | POA: Diagnosis not present

## 2023-12-13 DIAGNOSIS — E785 Hyperlipidemia, unspecified: Secondary | ICD-10-CM | POA: Diagnosis not present

## 2023-12-13 DIAGNOSIS — M545 Low back pain, unspecified: Secondary | ICD-10-CM | POA: Diagnosis not present

## 2023-12-13 DIAGNOSIS — E278 Other specified disorders of adrenal gland: Secondary | ICD-10-CM

## 2023-12-13 DIAGNOSIS — M25511 Pain in right shoulder: Secondary | ICD-10-CM

## 2023-12-13 DIAGNOSIS — F411 Generalized anxiety disorder: Secondary | ICD-10-CM

## 2023-12-13 DIAGNOSIS — K529 Noninfective gastroenteritis and colitis, unspecified: Secondary | ICD-10-CM

## 2023-12-13 DIAGNOSIS — G8929 Other chronic pain: Secondary | ICD-10-CM

## 2023-12-13 LAB — COMPREHENSIVE METABOLIC PANEL WITH GFR
ALT: 12 U/L (ref 0–35)
AST: 20 U/L (ref 0–37)
Albumin: 4.5 g/dL (ref 3.5–5.2)
Alkaline Phosphatase: 40 U/L (ref 39–117)
BUN: 17 mg/dL (ref 6–23)
CO2: 30 meq/L (ref 19–32)
Calcium: 9.9 mg/dL (ref 8.4–10.5)
Chloride: 100 meq/L (ref 96–112)
Creatinine, Ser: 1.06 mg/dL (ref 0.40–1.20)
GFR: 59.62 mL/min — ABNORMAL LOW (ref >60.00)
Glucose, Bld: 85 mg/dL (ref 70–99)
Potassium: 4.8 meq/L (ref 3.5–5.1)
Sodium: 138 meq/L (ref 135–145)
Total Bilirubin: 0.7 mg/dL (ref 0.2–1.2)
Total Protein: 7.1 g/dL (ref 6.0–8.3)

## 2023-12-13 LAB — LIPID PANEL
Cholesterol: 224 mg/dL — ABNORMAL HIGH (ref 0–200)
HDL: 62.6 mg/dL (ref >39.00)
LDL Cholesterol: 119 mg/dL — ABNORMAL HIGH (ref 0–99)
NonHDL: 161.09
Total CHOL/HDL Ratio: 4
Triglycerides: 209 mg/dL — ABNORMAL HIGH (ref 0.0–149.0)
VLDL: 41.8 mg/dL — ABNORMAL HIGH (ref 0.0–40.0)

## 2023-12-13 MED ORDER — GABAPENTIN 300 MG PO CAPS: 300.0000 mg | ORAL_CAPSULE | Freq: Three times a day (TID) | ORAL | 5 refills | Status: DC

## 2023-12-13 MED ORDER — TRAZODONE HCL 100 MG PO TABS: 100.0000 mg | ORAL_TABLET | Freq: Every evening | ORAL | 1 refills | Status: DC | PRN

## 2023-12-13 MED ORDER — SERTRALINE HCL 100 MG PO TABS: 150.0000 mg | ORAL_TABLET | Freq: Every day | ORAL | 1 refills | Status: AC

## 2023-12-13 NOTE — Assessment & Plan Note (Signed)
 Well controlled with prn lomotil .

## 2023-12-13 NOTE — Assessment & Plan Note (Signed)
 Stable with trazodone . Continue same.

## 2023-12-13 NOTE — Assessment & Plan Note (Signed)
 Noted on MRI, will need repeat imaging 8/25.

## 2023-12-13 NOTE — Assessment & Plan Note (Signed)
 Followed by neuro- has been on Ajovy .

## 2023-12-13 NOTE — Assessment & Plan Note (Signed)
 Overall stable control with sertraline  and prn lorazepam .  Continue same. Controlled substance contract is updated today.

## 2023-12-13 NOTE — Patient Instructions (Signed)
 VISIT SUMMARY:  Today, we addressed your right arm and shoulder pain, low back pain, vision changes, migraines, diarrhea, and iron deficiency. We have made several referrals and adjustments to your treatment plan to help manage these issues.  YOUR PLAN:  RIGHT SHOULDER PAIN: You have severe pain in your right shoulder with limited movement, possibly due to a rotator cuff tear or other structural issues. -You are being referred to Dr. Alfredo Ano, a shoulder specialist in [place], for further evaluation.  LOW BACK PAIN: You have chronic low back pain that has not been effectively managed with previous treatments. -You are being referred to Dr. Larry Poag, an orthopedic back surgeon, for a second opinion.  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 -We will complete a handicap placard form for your vision impairment.  MIGRAINE: You suffer from chronic migraines that have not responded to current treatments. -We will contact neurology to follow up on your migraine management.  DIARRHEA: You have chronic diarrhea that you manage with Lomotil . -Lomotil  will be added to your medication contract.  IRON DEFICIENCY: You have chronic iron deficiency and cannot tolerate oral supplements. -We will monitor your iron levels.

## 2023-12-13 NOTE — Assessment & Plan Note (Addendum)
   Followed by neuro- has been on Ajovy .  Still having migraines- advised pt to follow back up with neuro.

## 2023-12-13 NOTE — Progress Notes (Signed)
 Subjective:     Patient ID: Misty Beck, female    DOB: [birth year], 53 y.o.   MRN: [id number]  Chief Complaint  Patient presents with   Arm Pain    Patient complains of right arm and shoulder pain   Back Pain    Patient reports low back pain    HPI  Discussed the use of AI scribe software for clinical note transcription with the patient, who gave verbal consent to proceed.  History of Present Illness Misty Beck is a 53 year old female who presents with right arm and shoulder pain, low back pain, and vision changes.  She experiences severe right arm and shoulder pain for the past month, limiting arm movement. She has a history of left shoulder surgery. Persistent low back pain was previously evaluated by [facility], with no surgical intervention recommended. Pain management interventions provided short-term relief. Vision changes in the left eye, attributed to a past stroke, result in complete blurriness and affect her driving ability.  She suffers from migraines and has not found relief with Nurtec or abdominal injections. She is awaiting further treatment options. She takes lorazepam  two to three times a week for stress, which has increased due to recent life events, including an unexpected move. She also takes Zoloft , trazodone , and Lipitor. She frequently uses Lomotil  for loose stools and takes B12 supplements and a multivitamin, avoiding iron due to nausea. Ongoing stress related to moving and financial constraints is noted.      Health Maintenance Due  Topic Date Due   DTaP/Tdap/Td (1 - Tdap) Never done   COVID-19 Vaccine (3 - Pfizer risk series) 04/27/2020    Past Medical History:  Diagnosis Date   Asthma    Depression with anxiety 04/20/2021   Referral source Dr Missouri Amor bonsu   Diarrhea    referral source   Hyperlipidemia    referral source   Insomnia 04/20/2021   referral source Dr Gordy Lauber   Received intravenous tissue  plasminogen activator (tPA) in emergency department 10/24/2021   Stroke [facility])    Dec, 2021, Jan 2022    Past Surgical History:  Procedure Laterality Date   APPENDECTOMY  1992   left shoulder surgery     referral source Dr Brayton Calin   TOTAL VAGINAL HYSTERECTOMY  2009    Family History  Problem Relation Age of Onset   Stroke Mother    Cancer Mother    Colon polyps Mother    Skin cancer Mother    Breast cancer Mother 51   Kidney disease Father    Hyperlipidemia Father    COPD Father    Heart disease Father    Colon cancer Neg Hx    Rectal cancer Neg Hx    Stomach cancer Neg Hx     Social History   Socioeconomic History   Marital status: Divorced    Spouse name: Not on file   Number of children: 2   Years of education: Not on file   Highest education level: Not on file  Occupational History   Not on file  Tobacco Use   Smoking status: Former    Types: Cigarettes   Smokeless tobacco: Never  Substance and Sexual Activity   Alcohol use: Not Currently   Drug use: Not Currently   Sexual activity: Not on file  Other Topics Concern   Not on file  Social History Narrative   Not on file   Social Drivers  of Health   Financial Resource Strain: Not on file  Food Insecurity: Low Risk  (02/25/2023)   Received from [facility]   Hunger Vital Sign    Worried About Running Out of Food in the Last Year: Never true    Ran Out of Food in the Last Year: Never true  Transportation Needs: Not on file (02/25/2023)  Physical Activity: Not on file  Stress: Not on file  Social Connections: Not on file  Intimate Partner Violence: Not on file    Outpatient Medications Prior to Visit  Medication Sig Dispense Refill   aspirin  EC 325 MG tablet Take 1 tablet (325 mg total) by mouth daily.     aspirin -acetaminophen -caffeine  (EXCEDRIN MIGRAINE) 250-250-65 MG tablet Take 2 tablets by mouth 3 (three) times daily as needed for headache or migraine.     atorvastatin  (LIPITOR) 40 MG  tablet Take 1 tablet (40 mg total) by mouth daily. 30 tablet 2   Cyanocobalamin  (VITAMIN B-12 PO) Take 1 capsule by mouth 3 (three) times a week.     cyclobenzaprine  (FLEXERIL ) 10 MG tablet Take 1 tablet (10 mg total) by mouth every three (3) days as needed for muscle spasms. 60 tablet 3   diclofenac  Sodium (VOLTAREN ) 1 % GEL APPLY 2 GRAMS TOPICALLY 4 TIMES DAILY 400 g 1   diphenoxylate -atropine  (LOMOTIL ) 2.5-0.025 MG tablet TAKE 1 TABLET BY MOUTH 4 TIMES A DAY AS NEEDED FOR UP TO 30 DOSES FOR DIARRHEA OR LOOSE STOOLS 30 tablet 0   Fremanezumab -vfrm (AJOVY ) 225 MG/1.5ML SOAJ Inject 225 mg into the skin every 30 (thirty) days. 1.68 mL 11   LORazepam  (ATIVAN ) 0.5 MG tablet TAKE 1 TABLET BY MOUTH EVERY 8 HOURS AS NEEDED FOR ANXIETY 30 tablet 0   Melatonin 10 MG TABS Take 10 mg by mouth at bedtime as needed (sleep).     Multiple Vitamins-Minerals (MULTIVITAMIN WITH MINERALS) tablet Take 1 tablet by mouth daily.     ondansetron  (ZOFRAN -ODT) 4 MG disintegrating tablet Take 1 tablet (4 mg total) by mouth every 8 (eight) hours as needed for nausea or vomiting. 20 tablet 11   gabapentin  (NEURONTIN ) 300 MG capsule Take 1 capsule (300 mg total) by mouth 3 (three) times daily. 90 capsule 5   promethazine  (PHENERGAN ) 12.5 MG tablet TAKE 1 TABLET BY MOUTH EVERY 8 HOURS AS NEEDED FOR NAUSEA AND/OR VOMITING 20 tablet 0   sertraline  (ZOLOFT ) 100 MG tablet TAKE 1 AND 1/2 TABLET BY MOUTH DAILY 135 tablet 1   traZODone  (DESYREL ) 100 MG tablet TAKE 1 TABLET BY MOUTH EVERY NIGHT AT BEDTIME AS NEEDED FOR SLEEP 90 tablet 0   Rimegepant Sulfate (NURTEC) 75 MG TBDP Take 1 tab at onset of migraine.  May repeat in 2 hrs, if needed.  Max dose: 2 tabs/day. This is a 30 day prescription. 12 tablet 11   Rimegepant Sulfate (NURTEC) 75 MG TBDP Take 1 tablet (75 mg total) by mouth as needed (take 1 tab at onse of migraine).     No facility-administered medications prior to visit.    Allergies  Allergen Reactions   Doxycycline   Other (See Comments)    Burning skin     ROS See HPI    Objective:     Physical Exam Constitutional:      General: She is not in acute distress.    Appearance: Normal appearance. She is well-developed.  HENT:     Head: Normocephalic and atraumatic.     Right Ear: External ear normal.  Left Ear: External ear normal.  Eyes:     General: No scleral icterus. Neck:     Thyroid: No thyromegaly.  Cardiovascular:     Rate and Rhythm: Normal rate and regular rhythm.     Heart sounds: Normal heart sounds. No murmur heard. Pulmonary:     Effort: Pulmonary effort is normal. No respiratory distress.     Breath sounds: Normal breath sounds. No wheezing.  Musculoskeletal:     Cervical back: Neck supple.  Skin:    General: Skin is warm and dry.  Neurological:     Mental Status: She is alert and oriented to person, place, and time.  Psychiatric:        Mood and Affect: Mood normal.        Behavior: Behavior normal.        Thought Content: Thought content normal.        Judgment: Judgment normal.      BP 100/64 (BP Location: Right Arm, Patient Position: Sitting, Cuff Size: Small)   Pulse 73   Temp 98.9 F (37.2 C) (Oral)   Resp 16   Ht 5\' 4"  (1.626 m)   Wt 108 lb (49 kg)   SpO2 100%   BMI 18.54 kg/m  Wt Readings from Last 3 Encounters:  12/13/23 108 lb (49 kg)  10/10/23 109 lb (49.4 kg)  06/14/23 110 lb (49.9 kg)       Assessment & Plan:   Problem List Items Addressed This Visit       Unprioritized   Insomnia   Stable with trazodone . Continue same.      Relevant Medications   traZODone  (DESYREL ) 100 MG tablet   Chronic migraine w/o aura w/o status migrainosus, not intractable     Followed by neuro- has been on Ajovy .  Still having migraines- advised pt to follow back up with neuro.       Relevant Medications   sertraline  (ZOLOFT ) 100 MG tablet   traZODone  (DESYREL ) 100 MG tablet   gabapentin  (NEURONTIN ) 300 MG capsule   Chronic diarrhea   Well  controlled with prn lomotil .      Chronic bilateral low back pain without sciatica   Uncontrolled. Not satisfied with care at [facility]. Will refer to Spine and Scoliosis specialists.      Relevant Medications   sertraline  (ZOLOFT ) 100 MG tablet   traZODone  (DESYREL ) 100 MG tablet   gabapentin  (NEURONTIN ) 300 MG capsule   Other Relevant Orders   Ambulatory referral to Orthopedic Surgery   Anxiety state   Overall stable control with sertraline  and prn lorazepam .  Continue same. Controlled substance contract is updated today.       Relevant Medications   sertraline  (ZOLOFT ) 100 MG tablet   traZODone  (DESYREL ) 100 MG tablet   Adrenal mass (HCC)   Noted on MRI, will need repeat imaging 8/25.         Other Visit Diagnoses       Right shoulder pain, unspecified chronicity    -  Primary   Relevant Orders   Ambulatory referral to Orthopedic Surgery     Hyperlipidemia, unspecified hyperlipidemia type       Relevant Orders   Comp Met (CMET)   Lipid panel       I have discontinued Laquasha V. Yerby's promethazine , Nurtec, and Nurtec. I have also changed her sertraline  and traZODone . Additionally, I am having her maintain her aspirin  EC, Melatonin, Cyanocobalamin  (VITAMIN B-12 PO), aspirin -acetaminophen -caffeine , atorvastatin , cyclobenzaprine , multivitamin with minerals, diclofenac   Sodium, Ajovy , ondansetron , LORazepam , diphenoxylate -atropine , and gabapentin .  Meds ordered this encounter  Medications   sertraline  (ZOLOFT ) 100 MG tablet    Sig: Take 1.5 tablets (150 mg total) by mouth daily.    Dispense:  135 tablet    Refill:  1    Supervising Provider:   Randie Bustle A [[id number]]   traZODone  (DESYREL ) 100 MG tablet    Sig: Take 1 tablet (100 mg total) by mouth at bedtime as needed. for sleep    Dispense:  90 tablet    Refill:  1    Supervising Provider:   Randie Bustle A [[id number]]   gabapentin  (NEURONTIN ) 300 MG capsule    Sig: Take 1 capsule (300 mg total) by mouth 3  (three) times daily.    Dispense:  90 capsule    Refill:  5    Supervising Provider:   Randie Bustle A [[id number]]

## 2023-12-13 NOTE — Assessment & Plan Note (Signed)
 Uncontrolled. Not satisfied with care at [facility]. Will refer to Spine and Scoliosis specialists.

## 2023-12-15 ENCOUNTER — Ambulatory Visit: Payer: Self-pay | Admitting: Family

## 2023-12-15 DIAGNOSIS — E785 Hyperlipidemia, unspecified: Secondary | ICD-10-CM

## 2023-12-15 MED ORDER — ATORVASTATIN CALCIUM 40 MG PO TABS: 40.0000 mg | ORAL_TABLET | Freq: Every day | ORAL | 1 refills | Status: DC

## 2024-01-04 DIAGNOSIS — Z8669 Personal history of other diseases of the nervous system and sense organs: Secondary | ICD-10-CM | POA: Insufficient documentation

## 2024-01-16 ENCOUNTER — Inpatient Hospital Stay: Admission: RE | Admit: 2024-01-16 | Payer: Medicaid Other | Source: Ambulatory Visit

## 2024-03-23 ENCOUNTER — Other Ambulatory Visit: Payer: Self-pay | Admitting: Family

## 2024-03-23 DIAGNOSIS — K529 Noninfective gastroenteritis and colitis, unspecified: Secondary | ICD-10-CM

## 2024-03-23 DIAGNOSIS — G4700 Insomnia, unspecified: Secondary | ICD-10-CM

## 2024-03-23 DIAGNOSIS — F411 Generalized anxiety disorder: Secondary | ICD-10-CM

## 2024-04-02 ENCOUNTER — Telehealth: Payer: Self-pay

## 2024-04-02 NOTE — Telephone Encounter (Signed)
-----   Message from Misty Beck sent at 04/02/2024  9:21 AM EDT ----- Regarding: RE: 1 year MRI Thank you for the reminder.  Her follow up up imaging looked benign. ----- Message ----- From: Misty Cristino SAILOR, RN Sent: 04/02/2024   8:29 AM EDT To: Misty Ponto, NP Subject: 1 year MRI                                     Hi Melissa,  I am a nurse at [facility]. We had a reminder in our system for Mrs. Panchal to possibly have a 1 year repeat MRI to follow up on 8 x 10 mm left adrenal nodule noted on 02/2023 MRI. Just wanted to make sure you had this information in case you were wanting to proceed with repeat imaging. Thank you, Cristino Mercer, RN ----- Message ----- From: Misty Rung, RN Sent: 04/02/2024  12:00 AM EDT To: Beck Eloisa, RN  Reminder for MRI ABD in 1 year

## 2024-04-14 NOTE — Progress Notes (Unsigned)
 No chief complaint on file.     ASSESSMENT AND PLAN  Misty Beck is a 53 y.o. female   Chronic migraine headache  Continued headaches about 2-3 times per week  Initiate Ajovy  monthly injection, initial injection provided today with education provided  Start Nurtec as needed for rescue, triptans contraindicated due to TIA history  Start Zofran  4 mg as needed for nausea Discussed limiting OTC pain reliever use as she is likely experiencing component of rebound headache Recommend discontinuing propranolol  due to no benefit and hypotension Previously tried/failed: Propranolol , sertraline , gabapentin , Ajovy , Nurtec, would not recommend any other antihypertensives/beta-blockers due to hypotension, would not recommend other antidepressants due to current use of sertraline  and increased risk of serotonin syndrome, would avoid Aimovig  due to constipation history   Multiple recurrent strokelike symptoms, received IV thrombolytic treatment 4 times without positive MRI findings, most recent 1 was at [facility] on February 25, 2023, Jolynn Pack in May 2024,  No recurrent episodes  Can hold off on obtaining EEG (was not previously completed) - will pursue with any additional episodes     Follow-up in 6 months or call earlier if needed      DIAGNOSTIC DATA (LABS, IMAGING, TESTING) - I reviewed patient records, labs, notes, testing and imaging myself where available.   MEDICAL HISTORY:  Update 04/15/2024 JM: Patient returns for 43-month follow-up visit.       Update 10/10/2023 JM: Patient returns for 37-month follow-up visit accompanied by her husband.  She continues to experience about 2-3 migraines per week.  Insurance required her to try propranolol  prior to approval of Aimovig , she is currently on propranolol  60 mg daily but denies any improvement.  She never received prescription of Nurtec due to initial insurance denial and just recently got approved and then miscommunication with  pharmacy.  She has continued to take Excedrin on a daily basis.  Usually wakes up with either migraine or headache and gradually improves throughout the day.  Headaches can be associated with nausea.  Can occasionally have visual aura.  She is currently on sertraline  for depression and gabapentin  as needed for pain.  She does note difficulty sleeping at night and has daytime fatigue, does take trazodone  to help with initiation of sleep.  No snoring or witnessed apneas.  Has not had any new or recurrent stroke/TIA symptoms.  Consult visit 03/19/2023 Dr. Onita: Misty Beck is a 53 year old female, accompanied by her husband, seen in request by her primary care nurse practitioner Daryl Setter for evaluation of strokelike symptoms, initial evaluation March 27, 2023,  I reviewed and summarized the referring note. PMHX Depression, Anxiety Irritable bowel syndrome Vit B12 deificiency Chronic insomnia HLD  Hospital admission Dec 27, 2022, seen by stroke neurologist Dr. Jerri, presenting with sudden onset left-sided weakness, numbness, facial drooping, telestroke assessment was performed, even with the constraining of the camera examination, there was some questionable inconsistent on exam, she ended up receiving IV thrombolysis, then later transferred to [facility] for post thrombolytic care  Left-sided symptoms last about 1 day gradually recovered MRI of the brain was normal CT angiogram head and neck showed no large vessel disease  Laboratory showed A1c 5.1, LDL 47, normal CMP, CBC showed hemoglobin of 10.5  She had 2 similar occasions in 2021 and 2022, per patient also received IV thrombolytic treatment with no residual deficit  She again presenting to [facility] on February 25, 2023 for acute onset left-sided weakness, dysarthria, evaluated by telestroke, received  TNK, with total resolve  of symptoms  MRI of the brain was normal  CT angiogram and perfusion study showed no perfusion  abnormality, no intracranial vessel disease  Hemoglobin was 12.2  She complains of frequent headaches for many years, getting worse since 2023, 3 headaches each week, light noise sensitivity, taking almost daily Excedrin Migraine over past 2 years,    PHYSICAL EXAM:   There were no vitals filed for this visit.  There is no height or weight on file to calculate BMI.  PHYSICAL EXAMNIATION:  Gen: NAD, conversant, well nourised, well groomed                     Cardiovascular: Regular rate rhythm, no peripheral edema, warm, nontender. Eyes: Conjunctivae clear without exudates or hemorrhage Neck: Supple, no carotid bruits. Pulmonary: Clear to auscultation bilaterally   NEUROLOGICAL EXAM:  MENTAL STATUS: Speech/cognition: Awake, alert, oriented to history taking and casual conversation CRANIAL NERVES: CN II: Visual fields are full to confrontation. Pupils are round equal and briskly reactive to light. CN III, IV, VI: extraocular movement are normal. No ptosis. CN V: Facial sensation is intact to light touch CN VII: Face is symmetric with normal eye closure  CN VIII: Hearing is normal to causal conversation. CN IX, X: Phonation is normal. CN XI: Head turning and shoulder shrug are intact  MOTOR: There is no pronator drift of out-stretched arms. Muscle bulk and tone are normal. Muscle strength is normal.  REFLEXES: Reflexes are 2+ and symmetric at the biceps, triceps, knees, and ankles. Plantar responses are flexor.  SENSORY: Intact to light touch, pinprick and vibratory sensation are intact in fingers and toes.  COORDINATION: There is no trunk or limb dysmetria noted.  GAIT/STANCE: Posture is normal. Gait is steady with normal steps, base, arm swing, and turning. Heel and toe walking are normal. Tandem gait is normal.      REVIEW OF SYSTEMS:  Full 14 system review of systems performed and notable only for as above All other review of systems were  negative.   ALLERGIES: Allergies  Allergen Reactions   Doxycycline  Other (See Comments)    Burning skin     HOME MEDICATIONS: Current Outpatient Medications  Medication Sig Dispense Refill   aspirin  EC 325 MG tablet Take 1 tablet (325 mg total) by mouth daily.     aspirin -acetaminophen -caffeine  (EXCEDRIN MIGRAINE) 250-250-65 MG tablet Take 2 tablets by mouth 3 (three) times daily as needed for headache or migraine.     atorvastatin  (LIPITOR) 40 MG tablet Take 1 tablet (40 mg total) by mouth daily. 90 tablet 1   Cyanocobalamin  (VITAMIN B-12 PO) Take 1 capsule by mouth 3 (three) times a week.     cyclobenzaprine  (FLEXERIL ) 10 MG tablet Take 1 tablet (10 mg total) by mouth every three (3) days as needed for muscle spasms. 60 tablet 3   diclofenac  Sodium (VOLTAREN ) 1 % GEL APPLY 2 GRAMS TOPICALLY 4 TIMES DAILY 400 g 1   diphenoxylate -atropine  (LOMOTIL ) 2.5-0.025 MG tablet TAKE ONE TABLET 4 TIMES A DAY AS NEEDED FOR DIARRHEA OR LOOSE STOOLS 30 tablet 0   Fremanezumab -vfrm (AJOVY ) 225 MG/1.5ML SOAJ Inject 225 mg into the skin every 30 (thirty) days. 1.68 mL 11   gabapentin  (NEURONTIN ) 300 MG capsule Take 1 capsule (300 mg total) by mouth 3 (three) times daily. 90 capsule 5   LORazepam  (ATIVAN ) 0.5 MG tablet TAKE 1 TABLET BY MOUTH EVERY 8 HOURS AS NEEDED FOR ANXIETY 30 tablet 0  Melatonin 10 MG TABS Take 10 mg by mouth at bedtime as needed (sleep).     Multiple Vitamins-Minerals (MULTIVITAMIN WITH MINERALS) tablet Take 1 tablet by mouth daily.     ondansetron  (ZOFRAN -ODT) 4 MG disintegrating tablet Take 1 tablet (4 mg total) by mouth every 8 (eight) hours as needed for nausea or vomiting. 20 tablet 11   sertraline  (ZOLOFT ) 100 MG tablet Take 1.5 tablets (150 mg total) by mouth daily. 135 tablet 1   traZODone  (DESYREL ) 100 MG tablet TAKE 1 TABLET BY MOUTH DAILY AT BEDTIME AS NEEDED FOR SLEEP 90 tablet 1   No current facility-administered medications for this visit.    PAST MEDICAL  HISTORY: Past Medical History:  Diagnosis Date   Asthma    Depression with anxiety 04/20/2021   Referral source Dr Zachary berg bonsu   Diarrhea    referral source   Hyperlipidemia    referral source   Insomnia 04/20/2021   referral source Dr Rocco   Received intravenous tissue plasminogen activator (tPA) in emergency department 10/24/2021   Stroke [facility])    Dec, 2021, Jan 2022    PAST SURGICAL HISTORY: Past Surgical History:  Procedure Laterality Date   APPENDECTOMY  1992   left shoulder surgery     referral source Dr berg Brunt   TOTAL VAGINAL HYSTERECTOMY  2009    FAMILY HISTORY: Family History  Problem Relation Age of Onset   Stroke Mother    Cancer Mother    Colon polyps Mother    Skin cancer Mother    Breast cancer Mother 90   Kidney disease Father    Hyperlipidemia Father    COPD Father    Heart disease Father    Colon cancer Neg Hx    Rectal cancer Neg Hx    Stomach cancer Neg Hx     SOCIAL HISTORY: Social History   Socioeconomic History   Marital status: Divorced    Spouse name: Not on file   Number of children: 2   Years of education: Not on file   Highest education level: Not on file  Occupational History   Not on file  Tobacco Use   Smoking status: Former    Types: Cigarettes   Smokeless tobacco: Never  Substance and Sexual Activity   Alcohol use: Not Currently   Drug use: Not Currently   Sexual activity: Not on file  Other Topics Concern   Not on file  Social History Narrative   Not on file   Social Drivers of Health   Financial Resource Strain: Not on file  Food Insecurity: Low Risk  (02/25/2023)   Received from [facility]   Hunger Vital Sign    Within the past 12 months, you worried that your food would run out before you got money to buy more: Never true    Within the past 12 months, the food you bought just didn't last and you didn't have money to get more. : Never true  Transportation Needs: Not on file (02/25/2023)   Physical Activity: Not on file  Stress: Not on file  Social Connections: Not on file  Intimate Partner Violence: Not on file      I personally spent a total of *** minutes in the care of the patient today including {Time Based Coding:210964241}.   Harlene Bogaert, AGNP-BC  [facility] [address] [place], [place] [address]  Phone [phone] Fax [phone] Note: This document was prepared with digital dictation and possible smart  Lobbyist. Any transcriptional errors that result from this process are unintentional.

## 2024-04-15 ENCOUNTER — Encounter: Payer: Self-pay | Admitting: Adult Health

## 2024-04-15 ENCOUNTER — Ambulatory Visit: Admitting: Adult Health

## 2024-04-15 VITALS — BP 100/63 | HR 80 | Ht 64.0 in | Wt 109.0 lb

## 2024-04-15 DIAGNOSIS — G43709 Chronic migraine without aura, not intractable, without status migrainosus: Secondary | ICD-10-CM

## 2024-04-15 DIAGNOSIS — R200 Anesthesia of skin: Secondary | ICD-10-CM | POA: Diagnosis not present

## 2024-04-15 MED ORDER — QULIPTA 60 MG PO TABS: 60.0000 mg | ORAL_TABLET | Freq: Every day | ORAL | 11 refills | Status: DC

## 2024-04-15 MED ORDER — UBRELVY 100 MG PO TABS: 100.0000 mg | ORAL_TABLET | ORAL | 11 refills | Status: AC | PRN

## 2024-04-15 NOTE — Patient Instructions (Addendum)
 Your Plan:  Start Qulipta  60mg  daily for headache prevention   Start Ubrelvy  as needed for migraine rescue  Will start process for botox injections   You will be called to complete an MRI of your brain for further evaluation   Please try to limit daily use of Excedrine as this is going to cause rebound (worsening) headaches - would not recommend use more than 2-3x per week or 8 times per month      Follow up with botox injections or call earlier if needed     Thank you for coming to see us  at [facility]. I hope we have been able to provide you high quality care today.  You may receive a patient satisfaction survey over the next few weeks. We would appreciate your feedback and comments so that we may continue to improve ourselves and the health of our patients.

## 2024-04-16 ENCOUNTER — Emergency Department (HOSPITAL_COMMUNITY)
Admission: EM | Admit: 2024-04-16 | Discharge: 2024-04-16 | Disposition: A | Discharge status: Home / Self Care | Attending: Emergency Medicine | Admitting: Emergency Medicine

## 2024-04-16 ENCOUNTER — Emergency Department (HOSPITAL_COMMUNITY)

## 2024-04-16 ENCOUNTER — Other Ambulatory Visit: Payer: Self-pay

## 2024-04-16 ENCOUNTER — Telehealth: Payer: Self-pay | Admitting: Pharmacist

## 2024-04-16 DIAGNOSIS — F444 Conversion disorder with motor symptom or deficit: Secondary | ICD-10-CM

## 2024-04-16 DIAGNOSIS — R202 Paresthesia of skin: Secondary | ICD-10-CM | POA: Diagnosis present

## 2024-04-16 DIAGNOSIS — Z7982 Long term (current) use of aspirin: Secondary | ICD-10-CM | POA: Insufficient documentation

## 2024-04-16 DIAGNOSIS — R531 Weakness: Secondary | ICD-10-CM | POA: Diagnosis not present

## 2024-04-16 LAB — I-STAT CHEM 8, ED
BUN: 13 mg/dL (ref 6–20)
Calcium, Ion: 1.15 mmol/L (ref 1.15–1.40)
Chloride: 102 mmol/L (ref 98–111)
Creatinine, Ser: 1.3 mg/dL — ABNORMAL HIGH (ref 0.44–1.00)
Glucose, Bld: 89 mg/dL (ref 70–99)
HCT: 35 % — ABNORMAL LOW (ref 36.0–46.0)
Hemoglobin: 11.9 g/dL — ABNORMAL LOW (ref 12.0–15.0)
Potassium: 4.6 mmol/L (ref 3.5–5.1)
Sodium: 141 mmol/L (ref 135–145)
TCO2: 25 mmol/L (ref 22–32)

## 2024-04-16 LAB — COMPREHENSIVE METABOLIC PANEL WITH GFR
ALT: 12 U/L (ref 0–44)
AST: 18 U/L (ref 15–41)
Albumin: 3.7 g/dL (ref 3.5–5.0)
Alkaline Phosphatase: 55 U/L (ref 38–126)
Anion gap: 11 (ref 5–15)
BUN: 12 mg/dL (ref 6–20)
CO2: 24 mmol/L (ref 22–32)
Calcium: 9 mg/dL (ref 8.9–10.3)
Chloride: 104 mmol/L (ref 98–111)
Creatinine, Ser: 1.18 mg/dL — ABNORMAL HIGH (ref 0.44–1.00)
GFR, Estimated: 55 mL/min — ABNORMAL LOW (ref >60)
Glucose, Bld: 91 mg/dL (ref 70–99)
Potassium: 4.5 mmol/L (ref 3.5–5.1)
Sodium: 139 mmol/L (ref 135–145)
Total Bilirubin: 0.4 mg/dL (ref 0.0–1.2)
Total Protein: 6.2 g/dL — ABNORMAL LOW (ref 6.5–8.1)

## 2024-04-16 LAB — DIFFERENTIAL
Abs Immature Granulocytes: 0.02 K/uL (ref 0.00–0.07)
Basophils Absolute: 0.1 K/uL (ref 0.0–0.1)
Basophils Relative: 1 %
Eosinophils Absolute: 0.1 K/uL (ref 0.0–0.5)
Eosinophils Relative: 1 %
Immature Granulocytes: 0 %
Lymphocytes Relative: 13 %
Lymphs Abs: 1.2 K/uL (ref 0.7–4.0)
Monocytes Absolute: 1 K/uL (ref 0.1–1.0)
Monocytes Relative: 11 %
Neutro Abs: 7.5 K/uL (ref 1.7–7.7)
Neutrophils Relative %: 74 %

## 2024-04-16 LAB — APTT: aPTT: 26 s (ref 24–36)

## 2024-04-16 LAB — CBC
HCT: 37.4 % (ref 36.0–46.0)
Hemoglobin: 11.8 g/dL — ABNORMAL LOW (ref 12.0–15.0)
MCH: 29.8 pg (ref 26.0–34.0)
MCHC: 31.6 g/dL (ref 30.0–36.0)
MCV: 94.4 fL (ref 80.0–100.0)
Platelets: 220 K/uL (ref 150–400)
RBC: 3.96 MIL/uL (ref 3.87–5.11)
RDW: 12.5 % (ref 11.5–15.5)
WBC: 10 K/uL (ref 4.0–10.5)
nRBC: 0 % (ref 0.0–0.2)

## 2024-04-16 LAB — ETHANOL: Alcohol, Ethyl (B): <15 mg/dL (ref <15)

## 2024-04-16 LAB — PROTIME-INR
INR: 1 (ref 0.8–1.2)
Prothrombin Time: 13.2 s (ref 11.4–15.2)

## 2024-04-16 LAB — CBG MONITORING, ED: Glucose-Capillary: 99 mg/dL (ref 70–99)

## 2024-04-16 MED ORDER — SODIUM CHLORIDE 0.9 % IV BOLUS
500.0000 mL | Freq: Once | INTRAVENOUS | Status: AC
  Administered 2024-04-16: 500 mL via INTRAVENOUS

## 2024-04-16 MED ORDER — PROCHLORPERAZINE EDISYLATE 10 MG/2ML IJ SOLN
10.0000 mg | Freq: Once | INTRAMUSCULAR | Status: AC
Start: 2024-04-16 — End: 2024-04-16
  Administered 2024-04-16: 10 mg via INTRAVENOUS
  Filled 2024-04-16: qty 2

## 2024-04-16 MED ORDER — KETOROLAC TROMETHAMINE 30 MG/ML IJ SOLN
15.0000 mg | Freq: Once | INTRAMUSCULAR | Status: AC
  Administered 2024-04-16: 15 mg via INTRAVENOUS
  Filled 2024-04-16: qty 1

## 2024-04-16 MED ORDER — DIPHENHYDRAMINE HCL 50 MG/ML IJ SOLN
12.5000 mg | Freq: Once | INTRAMUSCULAR | Status: AC
Start: 2024-04-16 — End: 2024-04-16
  Administered 2024-04-16: 12.5 mg via INTRAVENOUS
  Filled 2024-04-16: qty 1

## 2024-04-16 NOTE — Consult Note (Addendum)
 NEUROLOGY CONSULT NOTE   Date of service: April 16, 2024 Patient Name: Misty Beck MRN:  [id number] DOB:  [birth year] Chief Complaint: left sided weakness Requesting Provider: Towana Ozell BROCKS, MD  History of Present Illness  Misty Beck is a 53 y.o. female with hx of chronic migraines, multiple prior strokelike episodes with negative imaging and multiple thrombolytic administrations with concern for stroke-depression, anxiety, hyperlipidemia, presenting for evaluation of sudden onset of left-sided weakness while shopping at [employer].  Last known well was 1:35 PM when she had sudden onset of the symptoms while at the checkout counter. Reports no headache at this time or at the time of symptom onset Evaluated by EMS and code stroke activated due to sudden onset of focal neurological deficit.  LKW: 1335 Modified rankin score: 0-Completely asymptomatic and back to baseline post- stroke IV Thrombolysis: Likely a stroke mimic given prior history and current examination EVT: No ELVO findings on exam  NIHSS components Score: Comment  1a Level of Conscious 0[x]  1[]  2[]  3[]      1b LOC Questions 0[x]  1[]  2[]       1c LOC Commands 0[x]  1[]  2[]       2 Best Gaze 0[x]  1[]  2[]       3 Visual 0[x]  1[]  2[]  3[]      4 Facial Palsy 0[]  1[x]  2[]  3[]      5a Motor Arm - left 0[]  1[]  2[x]  3[]  4[]  UN[]    5b Motor Arm - Right 0[x]  1[]  2[]  3[]  4[]  UN[]    6a Motor Leg - Left 0[]  1[]  2[x]  3[]  4[]  UN[]    6b Motor Leg - Right 0[x]  1[]  2[]  3[]  4[]  UN[]    7 Limb Ataxia 0[x]  1[]  2[]  UN[]      8 Sensory 0[]  1[]  2[x]  UN[]      9 Best Language 0[x]  1[]  2[]  3[]      10 Dysarthria 0[]  1[x]  2[]  UN[]      11 Extinct. and Inattention 0[x]  1[]  2[]       TOTAL: 8      ROS  Comprehensive ROS performed and pertinent positives documented in HPI   Past History   Past Medical History:  Diagnosis Date   Asthma    Depression with anxiety 04/20/2021   Referral source Dr Zachary berg bonsu   Diarrhea    referral  source   Hyperlipidemia    referral source   Insomnia 04/20/2021   referral source Dr Rocco   Received intravenous tissue plasminogen activator (tPA) in emergency department 10/24/2021   Stroke [facility])    Dec, 2021, Jan 2022    Past Surgical History:  Procedure Laterality Date   APPENDECTOMY  1992   left shoulder surgery     referral source Dr berg Brunt   TOTAL VAGINAL HYSTERECTOMY  2009    Family History: Family History  Problem Relation Age of Onset   Stroke Mother    Cancer Mother    Colon polyps Mother    Skin cancer Mother    Breast cancer Mother 19   Kidney disease Father    Hyperlipidemia Father    COPD Father    Heart disease Father    Colon cancer Neg Hx    Rectal cancer Neg Hx    Stomach cancer Neg Hx     Social History  reports that she has quit smoking. Her smoking use included cigarettes. She has never used smokeless tobacco. She reports that she does not currently use alcohol. She reports that she does not currently  use drugs.  Allergies  Allergen Reactions   Doxycycline  Other (See Comments)    Burning skin     Medications  No current facility-administered medications for this encounter.  Current Outpatient Medications:    aspirin  EC 325 MG tablet, Take 1 tablet (325 mg total) by mouth daily., Disp: , Rfl:    aspirin -acetaminophen -caffeine  (EXCEDRIN MIGRAINE) 250-250-65 MG tablet, Take 2 tablets by mouth 3 (three) times daily as needed for headache or migraine., Disp: , Rfl:    Atogepant  (QULIPTA ) 60 MG TABS, Take 1 tablet (60 mg total) by mouth daily., Disp: 30 tablet, Rfl: 11   atorvastatin  (LIPITOR) 40 MG tablet, Take 1 tablet (40 mg total) by mouth daily., Disp: 90 tablet, Rfl: 1   Cyanocobalamin  (VITAMIN B-12 PO), Take 1 capsule by mouth 3 (three) times a week., Disp: , Rfl:    cyclobenzaprine  (FLEXERIL ) 10 MG tablet, Take 1 tablet (10 mg total) by mouth every three (3) days as needed for muscle spasms., Disp: 60 tablet, Rfl: 3   diclofenac   Sodium (VOLTAREN ) 1 % GEL, APPLY 2 GRAMS TOPICALLY 4 TIMES DAILY, Disp: 400 g, Rfl: 1   diphenoxylate -atropine  (LOMOTIL ) 2.5-0.025 MG tablet, TAKE ONE TABLET 4 TIMES A DAY AS NEEDED FOR DIARRHEA OR LOOSE STOOLS, Disp: 30 tablet, Rfl: 0   LORazepam  (ATIVAN ) 0.5 MG tablet, TAKE 1 TABLET BY MOUTH EVERY 8 HOURS AS NEEDED FOR ANXIETY, Disp: 30 tablet, Rfl: 0   Melatonin 10 MG TABS, Take 10 mg by mouth at bedtime as needed (sleep)., Disp: , Rfl:    Multiple Vitamins-Minerals (MULTIVITAMIN WITH MINERALS) tablet, Take 1 tablet by mouth daily., Disp: , Rfl:    ondansetron  (ZOFRAN -ODT) 4 MG disintegrating tablet, Take 1 tablet (4 mg total) by mouth every 8 (eight) hours as needed for nausea or vomiting., Disp: 20 tablet, Rfl: 11   sertraline  (ZOLOFT ) 100 MG tablet, Take 1.5 tablets (150 mg total) by mouth daily., Disp: 135 tablet, Rfl: 1   traZODone  (DESYREL ) 100 MG tablet, TAKE 1 TABLET BY MOUTH DAILY AT BEDTIME AS NEEDED FOR SLEEP, Disp: 90 tablet, Rfl: 1   Ubrogepant  (UBRELVY ) 100 MG TABS, Take 1 tablet (100 mg total) by mouth as needed., Disp: 16 tablet, Rfl: 11  Vitals   Vitals:   05-03-2024 1400  Weight: 49 kg    Body mass index is 18.54 kg/m.   Physical Exam   General: Well-developed well-nourished in no acute distress HEENT: Normocephalic atraumatic Lungs: Clear Cardiovascular: Regular rate rhythm Neurological exam Awake alert oriented x 3 Volitional appearing dysarthria No aphasia Cranial nerves: Pupils equal round react light, extraocular movements intact, visual fields full, face is asymmetric with left lower facial weakness but that appears very volitional as it has had some variation spontaneously while speaking some words being more prominent and other times nearly not being present.  Facial sensation is diminished on the left with a sharp cut off in the midline and also splitting of the vibratory sensation on the forehead.  Tongue and palate midline. Motor examination reveals  significant weakness of the left upper and lower extremity with some nonorganic components such as some movement when not necessarily testing that arm witnessed during the exam. Sensation diminished to light touch on the left hemibody which are Lockie in the midline Coordination: Intact on the right, unable to test on the left.  Labs/Imaging/Neurodiagnostic studies   CBC:  Recent Labs  Lab 05-03-24 1500  HGB 11.9*  HCT 35.0*   Basic Metabolic Panel:  Lab Results  Component Value Date   NA 141 04/16/2024   K 4.6 04/16/2024   CO2 30 12/13/2023   GLUCOSE 89 04/16/2024   BUN 13 04/16/2024   CREATININE 1.30 (H) 04/16/2024   CALCIUM  9.9 12/13/2023   GFRNONAA >60 12/27/2022   Lipid Panel:  Lab Results  Component Value Date   LDLCALC 119 (H) 12/13/2023   HgbA1c:  Lab Results  Component Value Date   HGBA1C 5.6 12/28/2022   Urine Drug Screen:     Component Value Date/Time   LABOPIA NONE DETECTED 12/28/2022 2035   COCAINSCRNUR NONE DETECTED 12/28/2022 2035   LABBENZ NONE DETECTED 12/28/2022 2035   AMPHETMU NONE DETECTED 12/28/2022 2035   THCU POSITIVE (A) 12/28/2022 2035   LABBARB POSITIVE (A) 12/28/2022 2035    Alcohol Level     Component Value Date/Time   ETH <10 12/27/2022 1814    CT Head without contrast(Personally reviewed): No acute findings.  Aspects 10.  No bleed   ASSESSMENT   Misty Beck is a 53 y.o. female past medical history of strokelike episodes in the past for which she has received IV thrombolysis multiple times, presenting with similar symptoms of left-sided weakness, left-sided facial droop with some nonorganic findings on exam concerning for functional neurological disorder but given her sudden onset of focal deficits, I would do an MRI to rule out an acute stroke. 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  Will benefit from outpatient psychiatry evaluation for functional neurological disorder  Plan discussed with Dr. Towana ______________________________________________________________________    Signed, Eligio Lav, MD [facility]

## 2024-04-16 NOTE — ED Notes (Signed)
 Patient to MRI.

## 2024-04-16 NOTE — Code Documentation (Signed)
 Stroke Response Nurse Documentation Code Documentation  Misty Beck is a 53 y.o. female arriving to [facility]  via [facility] on 9/9 with past medical hx of stroke like episodes, anxiety, hld. On No antithrombotic. Code stroke was activated by EMS.   Patient from [place] parking lot where she was LKW at 1335 and now complaining of L sided weakness and facial droop of which she had sudden onset while shopping.   Stroke team at the bedside on patient arrival. Labs drawn and patient cleared for CT by Dr. Towana. Patient to CT with team. NIHSS 8, see documentation for details and code stroke times. Patient with left facial droop, left arm weakness, left leg weakness, left decreased sensation, and dysarthria  on exam. The following imaging was completed:  CT Head and MRI. Patient is not a candidate for IV Thrombolytic due to negative MRI. Patient is not a candidate for IR due to no LVO suspected.   Care Plan: cancel code stroke    Bedside handoff with ED RN Arnaldo and Wellington Lauraine LITTIE Ardeen  Stroke Response RN

## 2024-04-16 NOTE — ED Notes (Signed)
 Patient in ER treatment room

## 2024-04-16 NOTE — Telephone Encounter (Signed)
 Pharmacy Patient Advocate Encounter  Received notification from [facility] that Prior Authorization for UBRELVY  100 MG PO TABS has been APPROVED from 04/16/2024 to 04/16/2025   PA #/Case ID/Reference #: [id number]

## 2024-04-16 NOTE — ED Triage Notes (Addendum)
 Patient arrives to ER via EMS as a code stroke. Patient's last known normal was at 1330. BS 99. Patient reports sudden onset of left sided weakness. Patient denies headache. Patient to CT.

## 2024-04-16 NOTE — ED Provider Notes (Signed)
 [place] EMERGENCY DEPARTMENT AT [facility] Provider Note   CSN: [id number] Arrival date & time: 04/16/24  1452     Patient presents with: No chief complaint on file.   Misty Beck is a 53 y.o. female.  She is presenting as a code stroke activation.  She has a history of strokelike episodes and has received thrombolytics in the past.  Complaining of acute onset of left-sided numbness weakness that started around 1:30 PM today. She was taken emergently to CT after her neurologic exam.  {Add pertinent medical, surgical, social history, OB history to YEP:67052} The history is provided by the patient and the EMS personnel.  Cerebrovascular Accident This is a recurrent problem. The problem occurs constantly. The problem has not changed since onset.Pertinent negatives include no chest pain, no abdominal pain, no headaches and no shortness of breath.       Prior to Admission medications   Medication Sig Start Date End Date Taking? Authorizing Provider  aspirin  EC 325 MG tablet Take 1 tablet (325 mg total) by mouth daily. 06/12/22   O'Sullivan, Melissa, NP  aspirin -acetaminophen -caffeine  (EXCEDRIN MIGRAINE) 250-250-65 MG tablet Take 2 tablets by mouth 3 (three) times daily as needed for headache or migraine.    [provider]  Atogepant  (QULIPTA ) 60 MG TABS Take 1 tablet (60 mg total) by mouth daily. 04/15/24   Whitfield Raisin, NP  atorvastatin  (LIPITOR) 40 MG tablet Take 1 tablet (40 mg total) by mouth daily. 12/15/23   O'Sullivan, Melissa, NP  Cyanocobalamin  (VITAMIN B-12 PO) Take 1 capsule by mouth 3 (three) times a week.    [provider]  cyclobenzaprine  (FLEXERIL ) 10 MG tablet Take 1 tablet (10 mg total) by mouth every three (3) days as needed for muscle spasms. 03/27/23   Onita Duos, MD  diclofenac  Sodium (VOLTAREN ) 1 % GEL APPLY 2 GRAMS TOPICALLY 4 TIMES DAILY 10/02/23   O'Sullivan, Melissa, NP  diphenoxylate -atropine  (LOMOTIL ) 2.5-0.025 MG tablet TAKE ONE  TABLET 4 TIMES A DAY AS NEEDED FOR DIARRHEA OR LOOSE STOOLS 03/23/24   O'Sullivan, Melissa, NP  LORazepam  (ATIVAN ) 0.5 MG tablet TAKE 1 TABLET BY MOUTH EVERY 8 HOURS AS NEEDED FOR ANXIETY 03/23/24   O'Sullivan, Melissa, NP  Melatonin 10 MG TABS Take 10 mg by mouth at bedtime as needed (sleep).    [provider]  Multiple Vitamins-Minerals (MULTIVITAMIN WITH MINERALS) tablet Take 1 tablet by mouth daily. 06/15/23   O'Sullivan, Melissa, NP  ondansetron  (ZOFRAN -ODT) 4 MG disintegrating tablet Take 1 tablet (4 mg total) by mouth every 8 (eight) hours as needed for nausea or vomiting. 10/10/23   Whitfield Raisin, NP  sertraline  (ZOLOFT ) 100 MG tablet Take 1.5 tablets (150 mg total) by mouth daily. 12/13/23   O'Sullivan, Melissa, NP  traZODone  (DESYREL ) 100 MG tablet TAKE 1 TABLET BY MOUTH DAILY AT BEDTIME AS NEEDED FOR SLEEP 03/23/24   Daryl Setter, NP  Ubrogepant  (UBRELVY ) 100 MG TABS Take 1 tablet (100 mg total) by mouth as needed. 04/15/24   Whitfield Raisin, NP    Allergies: Doxycycline     Review of Systems  Constitutional:  Negative for fever.  HENT:  Negative for sore throat.   Eyes:  Negative for visual disturbance.  Respiratory:  Negative for shortness of breath.   Cardiovascular:  Negative for chest pain.  Gastrointestinal:  Negative for abdominal pain.  Genitourinary:  Negative for dysuria.  Skin:  Negative for rash.  Neurological:  Positive for weakness and numbness. Negative for headaches.  Updated Vital Signs There were no vitals taken for this visit.  Physical Exam Vitals and nursing note reviewed.  Constitutional:      General: She is not in acute distress.    Appearance: Normal appearance. She is well-developed.  HENT:     Head: Normocephalic and atraumatic.  Eyes:     Conjunctiva/sclera: Conjunctivae normal.  Cardiovascular:     Rate and Rhythm: Normal rate and regular rhythm.     Heart sounds: No murmur heard. Pulmonary:     Effort: Pulmonary effort is  normal. No respiratory distress.     Breath sounds: Normal breath sounds. No stridor. No wheezing.  Abdominal:     Palpations: Abdomen is soft.     Tenderness: There is no abdominal tenderness. There is no guarding or rebound.  Musculoskeletal:        General: No tenderness or deformity. Normal range of motion.     Cervical back: Neck supple.  Skin:    General: Skin is warm and dry.  Neurological:     General: No focal deficit present.     Mental Status: She is alert.     GCS: GCS eye subscore is 4. GCS verbal subscore is 5. GCS motor subscore is 6.     Sensory: Sensory deficit present.     Motor: Weakness present.     Comments: Patient noted to have weakness left arm left leg and subjective decrease sensation.  No gross facial droop.  No slurred speech.     (all labs ordered are listed, but only abnormal results are displayed) Labs Reviewed  ETHANOL  PROTIME-INR  APTT  CBC  DIFFERENTIAL  COMPREHENSIVE METABOLIC PANEL WITH GFR  RAPID URINE DRUG SCREEN, HOSP PERFORMED  CBG MONITORING, ED  I-STAT CHEM 8, ED    EKG: None  Radiology: No results found.  {Document cardiac monitor, telemetry assessment procedure when appropriate:32947} Procedures   Medications Ordered in the ED - No data to display  Clinical Course as of 04/16/24 1518  Thu Apr 16, 2024  1505 Patient's head CT pulmonary Willma negative.  Neurology Dr. Arora is recommending going for stat MRI. [MB]  1517 On review of prior medical records patient has received TNK in the past for similar deficits.  Imaging however it showed no sign of stroke.  She follows with neurology for complex migraines. [MB]    Clinical Course User Index [MB] Towana Ozell BROCKS, MD   {Click here for ABCD2, HEART and other calculators REFRESH Note before signing:1}                              Medical Decision Making Amount and/or Complexity of Data Reviewed Labs: ordered. Radiology: ordered.   This patient complains of ***; this  involves an extensive number of treatment Options and is a complaint that carries with it a high risk of complications and morbidity. The differential includes ***  I ordered, reviewed and interpreted labs, which included *** I ordered medication *** and reviewed PMP when indicated. I ordered imaging studies which included *** and I independently    visualized and interpreted imaging which showed *** Additional history obtained from *** Previous records obtained and reviewed *** I consulted *** and discussed lab and imaging findings and discussed disposition.  Cardiac monitoring reviewed, *** Social determinants considered, *** Critical Interventions: ***  After the interventions stated above, I reevaluated the patient and found *** Admission and further testing considered, ***   {  Document critical care time when appropriate  Document review of labs and clinical decision tools ie CHADS2VASC2, etc  Document your independent review of radiology images and any outside records  Document your discussion with family members, caretakers and with consultants  Document social determinants of health affecting pt's care  Document your decision making why or why not admission, treatments were needed:32947:::1}   Final diagnoses:  None    ED Discharge Orders     None

## 2024-04-16 NOTE — Discharge Instructions (Signed)
 You were seen in the emergency department for left-sided numbness and weakness.  You were evaluated by neurology and had an MRI along with other testing that did not show any acute sign of stroke.  Your symptoms improved in the department and we feel you are able to be discharged and follow-up with your neurology team.  Please continue your regular medications and return to the emergency department if any worsening or concerning symptoms.

## 2024-04-16 NOTE — Telephone Encounter (Signed)
 Pharmacy Patient Advocate Encounter   Received notification from Patient Pharmacy that prior authorization for Ubrelvy  100MG  tablets is required/requested.   Insurance verification completed.   The patient is insured through [facility] .   Per test claim: PA required; PA submitted to above mentioned insurance via Latent Key/confirmation #/EOC [id number] Status is pending

## 2024-04-16 NOTE — ED Notes (Signed)
 Patient discharged to home with husband. Patient given written and oral discharge instructions. Patient verbalized understanding. Patient ambulatory without difficulty. Patient breathing without difficulty. Patient denies pain. Patient denies questions, concerns or needs at this time.

## 2024-04-17 ENCOUNTER — Telehealth: Payer: Self-pay | Admitting: Adult Health

## 2024-04-17 NOTE — Telephone Encounter (Signed)
 She went to the ED and had her MRI there. I will cancel our order.

## 2024-04-20 NOTE — Telephone Encounter (Signed)
Noted.  Thank you for letting me know.

## 2024-04-28 ENCOUNTER — Telehealth: Payer: Self-pay

## 2024-04-28 ENCOUNTER — Other Ambulatory Visit (HOSPITAL_COMMUNITY): Payer: Self-pay

## 2024-04-28 NOTE — Telephone Encounter (Signed)
 Pharmacy Patient Advocate Encounter   Received notification from CoverMyMeds that prior authorization for Qulipta  60mg  tablets is required/requested.   Insurance verification completed.   The patient is insured through [facility] MEDICAID .   Per test claim: PA required; PA submitted to above mentioned insurance via Latent Key/confirmation #/EOC [id number] Status is pending

## 2024-04-29 NOTE — Telephone Encounter (Signed)
 Pharmacy Patient Advocate Encounter  Received notification from [facility] MEDICAID that Prior Authorization for Qulipta  has been APPROVED from 04/28/2024 to 07/28/2024   PA #/Case ID/Reference #: [id number]

## 2024-05-15 ENCOUNTER — Encounter: Payer: Self-pay | Admitting: Family

## 2024-05-15 ENCOUNTER — Ambulatory Visit (INDEPENDENT_AMBULATORY_CARE_PROVIDER_SITE_OTHER): Admitting: Family

## 2024-05-15 VITALS — BP 90/57 | HR 82 | Temp 99.6°F | Resp 16 | Ht 64.0 in | Wt 100.0 lb

## 2024-05-15 DIAGNOSIS — R0789 Other chest pain: Secondary | ICD-10-CM

## 2024-05-15 DIAGNOSIS — R232 Flushing: Secondary | ICD-10-CM

## 2024-05-15 DIAGNOSIS — K5900 Constipation, unspecified: Secondary | ICD-10-CM | POA: Insufficient documentation

## 2024-05-15 DIAGNOSIS — F411 Generalized anxiety disorder: Secondary | ICD-10-CM | POA: Diagnosis not present

## 2024-05-15 DIAGNOSIS — G4700 Insomnia, unspecified: Secondary | ICD-10-CM

## 2024-05-15 DIAGNOSIS — H5462 Unqualified visual loss, left eye, normal vision right eye: Secondary | ICD-10-CM

## 2024-05-15 HISTORY — DX: Constipation, unspecified: K59.00

## 2024-05-15 HISTORY — DX: Unqualified visual loss, left eye, normal vision right eye: H54.62

## 2024-05-15 MED ORDER — CYCLOBENZAPRINE HCL 10 MG PO TABS: 10.0000 mg | ORAL_TABLET | Freq: Three times a day (TID) | ORAL | 0 refills | Status: DC | PRN

## 2024-05-15 MED ORDER — LORAZEPAM 0.5 MG PO TABS: 0.5000 mg | ORAL_TABLET | Freq: Three times a day (TID) | ORAL | 0 refills | Status: DC | PRN

## 2024-05-15 NOTE — Assessment & Plan Note (Addendum)
 EKG tracing is personally reviewed.  EKG notes NSR.  No acute changes.  Suspect that anxiety and constipation are contributing factors, but I will refer to cardiology for further evaluation.

## 2024-05-15 NOTE — Progress Notes (Signed)
 Subjective:     Patient ID: Misty Beck, female    DOB: August 03, 1970, 53 y.o.   MRN: [id number]  Chief Complaint  Patient presents with   Anorexia    Patient complains of not being able to eat much for about 3 weeks.    Anxiety    Here for follow up    Anxiety      Discussed the use of AI scribe software for clinical note transcription with the patient, who gave verbal consent to proceed.  History of Present Illness   Misty Beck is a 53 year old female with a history of stroke who presents with poor appetite and constipation.  She has experienced poor appetite for three weeks and constipation, with no bowel movement in several weeks. She has not taken any over-the-counter laxatives, although she usually uses three laxative pills from [employer] to relieve constipation.  She experiences significant anxiety, which she describes as 'through the roof.' Her husband also experiences anxiety and has been drinking excessively, although he has recently cut back. She feels she does not see her children enough and is unable to drive on highways due to vision issues, which contributes to her anxiety.  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  She experiences hot flashes day and night and has previously tried gabapentin  up to 300 mg without relief. She is currently taking sertraline  150 mg daily and trazodone  at bedtime, which helps with sleep, although she sometimes wakes up early. She also takes melatonin and occasionally consumes 'spiked chocolate chip cookies' to stimulate her appetite.  She experiences chest pains when anxious, but without stairs at home, she cannot determine if exertion worsens the pain. These chest pains occur frequently.    Health Maintenance Due  Topic Date Due   DTaP/Tdap/Td (1 - Tdap) Never done   Hepatitis B  Vaccines 19-59 Average Risk (1 of 3 - 19+ 3-dose series) Never done   Pneumococcal Vaccine: 50+ Years (1 of 1 - PCV) Never done   COVID-19 Vaccine (3 - Pfizer risk series) 04/27/2020    Past Medical History:  Diagnosis Date   Asthma    Depression with anxiety 04/20/2021   Referral source Dr Zachary berg bonsu   Diarrhea    referral source   Hyperlipidemia    referral source   Insomnia 04/20/2021   referral source Dr Rocco   Received intravenous tissue plasminogen activator (tPA) in emergency department 10/24/2021   Stroke [facility])    Dec, 2021, Jan 2022    Past Surgical History:  Procedure Laterality Date   APPENDECTOMY  1992   left shoulder surgery     referral source Dr berg Brunt   TOTAL VAGINAL HYSTERECTOMY  2009    Family History  Problem Relation Age of Onset   Stroke Mother    Cancer Mother    Colon polyps Mother    Skin cancer Mother    Breast cancer Mother 67   Kidney disease Father    Hyperlipidemia Father    COPD Father    Heart disease Father    Colon cancer Neg Hx    Rectal cancer Neg Hx    Stomach cancer Neg Hx     Social History   Socioeconomic History   Marital status: Divorced    Spouse name: Not on file  Number of children: 2   Years of education: Not on file   Highest education level: Not on file  Occupational History   Not on file  Tobacco Use   Smoking status: Former    Types: Cigarettes   Smokeless tobacco: Never  Substance and Sexual Activity   Alcohol use: Not Currently   Drug use: Not Currently   Sexual activity: Not on file  Other Topics Concern   Not on file  Social History 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 : Never true  Transportation Needs: Not on file (02/25/2023)  Physical Activity: Not on file  Stress: Not on file  Social Connections: Not on file  Intimate Partner Violence: Not on file    Outpatient Medications Prior to Visit  Medication Sig Dispense Refill   aspirin -acetaminophen -caffeine  (EXCEDRIN MIGRAINE) 250-250-65 MG tablet Take 2 tablets by mouth 3 (three) times daily as needed for headache or migraine.     diphenoxylate -atropine  (LOMOTIL ) 2.5-0.025 MG tablet TAKE ONE TABLET 4 TIMES A DAY AS NEEDED FOR DIARRHEA OR LOOSE STOOLS 30 tablet 0   Melatonin 10 MG TABS Take 10 mg by mouth at bedtime as needed (sleep).     Multiple Vitamins-Minerals (MULTIVITAMIN WITH MINERALS) tablet Take 1 tablet by mouth daily.     ondansetron  (ZOFRAN -ODT) 4 MG disintegrating tablet Take 1 tablet (4 mg total) by mouth every 8 (eight) hours as needed for nausea or vomiting. 20 tablet 11   sertraline  (ZOLOFT ) 100 MG tablet Take 1.5 tablets (150 mg total) by mouth daily. 135 tablet 1   traZODone  (DESYREL ) 100 MG tablet TAKE 1 TABLET BY MOUTH DAILY AT BEDTIME AS NEEDED FOR SLEEP 90 tablet 1   Ubrogepant  (UBRELVY ) 100 MG TABS Take 1 tablet (100 mg total) by mouth as needed. 16 tablet 11   LORazepam  (ATIVAN ) 0.5 MG tablet TAKE 1 TABLET BY MOUTH EVERY 8 HOURS AS NEEDED FOR ANXIETY 30 tablet 0   Atogepant  (QULIPTA ) 60 MG TABS Take 1 tablet (60 mg total) by mouth daily. (Patient not taking: Reported on 05/15/2024) 30 tablet 11   No facility-administered medications prior to visit.    Allergies  Allergen Reactions   Doxycycline  Other (See Comments)    Burning skin     ROS See     Objective:    Physical Exam Constitutional:      General: She is not in acute distress.    Appearance: Normal appearance. She is well-developed.  HENT:     Head: Normocephalic and atraumatic.     Right Ear: External ear normal.     Left Ear: External ear  normal.  Eyes:     General: No scleral icterus. Neck:     Thyroid: No thyromegaly.  Cardiovascular:     Rate and Rhythm: Normal rate and regular rhythm.     Heart sounds: Normal heart sounds. No murmur heard. Pulmonary:     Effort: Pulmonary effort is normal. No respiratory distress.     Breath sounds: Normal breath sounds. No wheezing.  Musculoskeletal:  Cervical back: Neck supple.  Skin:    General: Skin is warm and dry.  Neurological:     Mental Status: She is alert and oriented to person, place, and time.  Psychiatric:        Mood and Affect: Mood normal.        Behavior: Behavior normal.        Thought Content: Thought content normal.        Judgment: Judgment normal.      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 Filled out handicap placard form for her today.       Insomnia   Fair response to trazodone . Continue same.      Hot flashes   Given stroke hx, not a candidate for HRT.  Found no improvement with gabapentin .  Already on SSRI.  Veozah is non-formulary. Advised pt that unfortunately we don't have a lot of other options at this time.       Constipation   She has not moved her bowels in several weeks. Normal abdominal exam. In the past she has not had relief with miralax but has had relief with otc generic ex-lax.  She is advised to try this and let us  know if no BM in 24 hours.  I think her constipation is primary cause of her anorexia.       Atypical chest pain  - Primary   EKG tracing is personally reviewed.  EKG notes NSR.  No acute changes.  Suspect that anxiety and constipation are contributing factors, but I will refer to cardiology for further evaluation.       Relevant Orders   EKG 12-Lead (Completed)   Ambulatory referral to Cardiology   Anxiety state   Increased stress at home.  Will continue sertraline /lorazepam . Contract up to date.       Relevant Medications   LORazepam  (ATIVAN ) 0.5 MG tablet    I have discontinued Theodosia V. Dominski's Qulipta . I have also changed her LORazepam . Additionally, I am having her start on cyclobenzaprine . Lastly, I am having her maintain her Melatonin, aspirin -acetaminophen -caffeine , multivitamin with minerals, ondansetron , sertraline , traZODone , diphenoxylate -atropine , and Ubrelvy .  Meds ordered this encounter  Medications   LORazepam  (ATIVAN ) 0.5 MG tablet    Sig: Take 1 tablet (0.5 mg total) by mouth every 8 (eight) hours as needed. for anxiety    Dispense:  30 tablet    Refill:  0    Supervising Provider:   DOMENICA BLACKBIRD A [[id number]]   cyclobenzaprine  (FLEXERIL ) 10 MG tablet    Sig: Take 1 tablet (10 mg total) by mouth 3 (three) times daily as needed for muscle spasms.    Dispense:  30 tablet    Refill:  0    Supervising Provider:   DOMENICA BLACKBIRD A [[id number]]

## 2024-05-15 NOTE — Assessment & Plan Note (Signed)
 Increased stress at home.  Will continue sertraline /lorazepam . Contract up to date.

## 2024-05-15 NOTE — Assessment & Plan Note (Addendum)
 Has very poor vision in the left eye following previous stroke. Filled out handicap placard form for her today.

## 2024-05-15 NOTE — Patient Instructions (Signed)
 VISIT SUMMARY:  During your visit, we discussed your ongoing issues with poor appetite, constipation, chest pain, anxiety, depression, insomnia, menopausal symptoms, and vision loss. We have made several recommendations and adjustments to your treatment plan to help manage these conditions.  YOUR PLAN:  CONSTIPATION WITH POOR APPETITE: You have been experiencing chronic constipation for three weeks, which is likely causing your poor appetite. -Take over-the-counter laxative pills. -Increase your water intake. -Contact us  if you do not have a bowel movement in 24 hours or if you develop vomiting or severe abdominal pain.  ATYPICAL CHEST PAIN: You have chest pain associated with anxiety, but it is not related to physical exertion. -Your EKG is normal but I would still like for you to meet with the Cardiologist upstairs.   ANXIETY AND DEPRESSION: Your anxiety and depression are being exacerbated by personal stressors. You are currently taking sertraline  and Ativan . -We have refilled your Ativan  prescription. -Continue taking sertraline  at your current dose.  INSOMNIA: You are experiencing insomnia with early morning awakenings, which may be related to your anxiety. -Continue your current medications and discuss any changes in your sleep patterns with us .  MENOPAUSAL SYMPTOMS (HOT FLASHES): You are experiencing severe hot flashes and have not found relief with gabapentin . Hormonal treatments are not an option for you, and a new non-hormonal medication is not covered by your insurance. -We will continue to explore other treatment options for your hot flashes as the become available.  VISION LOSS, LEFT EYE, SECONDARY TO PRIOR STROKE: You have vision loss in your left eye since your third stroke, and you can only perceive colors. -Continue to manage your vision loss as previously discussed.

## 2024-05-15 NOTE — Assessment & Plan Note (Signed)
 Fair response to trazodone . Continue same.

## 2024-05-15 NOTE — Assessment & Plan Note (Signed)
 Given stroke hx, not a candidate for HRT.  Found no improvement with gabapentin .  Already on SSRI.  Veozah is non-formulary. Advised pt that unfortunately we don't have a lot of other options at this time.

## 2024-05-15 NOTE — Assessment & Plan Note (Signed)
 She has not moved her bowels in several weeks. Normal abdominal exam. In the past she has not had relief with miralax but has had relief with otc generic ex-lax.  She is advised to try this and let us  know if no BM in 24 hours.  I think her constipation is primary cause of her anorexia.

## 2024-06-19 ENCOUNTER — Ambulatory Visit: Admitting: Family

## 2024-07-01 DIAGNOSIS — E785 Hyperlipidemia, unspecified: Secondary | ICD-10-CM | POA: Insufficient documentation

## 2024-07-01 DIAGNOSIS — M47816 Spondylosis without myelopathy or radiculopathy, lumbar region: Secondary | ICD-10-CM | POA: Insufficient documentation

## 2024-07-01 DIAGNOSIS — J45909 Unspecified asthma, uncomplicated: Secondary | ICD-10-CM | POA: Insufficient documentation

## 2024-07-02 ENCOUNTER — Encounter: Payer: Self-pay | Admitting: Cardiology

## 2024-07-02 ENCOUNTER — Ambulatory Visit: Attending: Cardiology | Admitting: Cardiology

## 2024-07-02 VITALS — BP 100/68 | HR 75 | Resp 16 | Ht 64.0 in | Wt 101.0 lb

## 2024-07-02 DIAGNOSIS — Z8673 Personal history of transient ischemic attack (TIA), and cerebral infarction without residual deficits: Secondary | ICD-10-CM | POA: Diagnosis present

## 2024-07-02 DIAGNOSIS — R0789 Other chest pain: Secondary | ICD-10-CM | POA: Diagnosis present

## 2024-07-02 DIAGNOSIS — R072 Precordial pain: Secondary | ICD-10-CM | POA: Insufficient documentation

## 2024-07-02 DIAGNOSIS — E782 Mixed hyperlipidemia: Secondary | ICD-10-CM | POA: Diagnosis present

## 2024-07-02 DIAGNOSIS — H5462 Unqualified visual loss, left eye, normal vision right eye: Secondary | ICD-10-CM | POA: Diagnosis present

## 2024-07-02 DIAGNOSIS — R0609 Other forms of dyspnea: Secondary | ICD-10-CM | POA: Diagnosis present

## 2024-07-02 MED ORDER — METOPROLOL TARTRATE 100 MG PO TABS: ORAL_TABLET | ORAL | 0 refills | Status: DC

## 2024-07-02 NOTE — Patient Instructions (Signed)
 Medication Instructions:  TAKE: Metoprolol  100mg  1 tablet 2 hours prior to CT scan   Lab Work: None Ordered If you have labs (blood work) drawn today and your tests are completely normal, you will receive your results only by: MyChart Message (if you have MyChart) OR A paper copy in the mail If you have any lab test that is abnormal or we need to change your treatment, we will call you to review the results.   Testing/Procedures:  Your physician has requested that you have an echocardiogram. Echocardiography is a painless test that uses sound waves to create images of your heart. It provides your doctor with information about the size and shape of your heart and how well your heart's chambers and valves are working. This procedure takes approximately one hour. There are no restrictions for this procedure. Please do NOT wear cologne, perfume, aftershave, or lotions (deodorant is allowed). Please arrive 15 minutes prior to your appointment time.  Please note: We ask at that you not bring children with you during ultrasound (echo/ vascular) testing. Due to room size and safety concerns, children are not allowed in the ultrasound rooms during exams. Our front office staff cannot provide observation of children in our lobby area while testing is being conducted. An adult accompanying a patient to their appointment will only be allowed in the ultrasound room at the discretion of the ultrasound technician under special circumstances. We apologize for any inconvenience.   Your cardiac CT will be scheduled at one of the below locations:   [facility] [address] [place], [place] [address]  Please follow these instructions carefully (unless otherwise directed):    On the Night Before the Test: Be sure to Drink plenty of water. Do not consume any caffeinated/decaffeinated beverages or chocolate 12 hours prior to your test. Do not take any antihistamines 12 hours prior to your  test.   On the Day of the Test: Drink plenty of water until 1 hour prior to the test. Do not eat any food 4 hours prior to the test. You may take your regular medications prior to the test.  Take metoprolol  (Lopressor ) two hours prior to test. FEMALES- please wear underwire-free bra if available, avoid dresses & tight clothing       After the Test: Drink plenty of water. After receiving IV contrast, you may experience a mild flushed feeling. This is normal. On occasion, you may experience a mild rash up to 24 hours after the test. This is not dangerous. If this occurs, you can take Benadryl 25 mg and increase your fluid intake. If you experience trouble breathing, this can be serious. If it is severe call 911 IMMEDIATELY. If it is mild, please call our office. If you take any of these medications: Glipizide/Metformin, Avandament, Glucavance, please do not take 48 hours after completing test unless otherwise instructed.  We will call to schedule your test 2-4 weeks out understanding that some insurance companies will need an authorization prior to the service being performed.   For non-scheduling related questions, please contact the cardiac imaging nurse navigator should you have any questions/concerns: Jinger Mount, Cardiac Imaging Nurse Navigator Chase Copping, Cardiac Imaging Nurse Navigator [facility] Direct Office Dial: [phone]   For scheduling needs, including cancellations and rescheduling, please call [place], [phone].    Follow-Up: At [facility], you and your health needs are our priority.  As part of our continuing mission to provide you with exceptional heart  care, we have created designated Provider Care Teams.  These Care Teams include your primary Cardiologist (physician) and Advanced Practice Providers (APPs -  Physician Assistants and Nurse Practitioners) who all work together to provide you with the care you need, when you  need it.  We recommend signing up for the patient portal called "MyChart".  Sign up information is provided on this After Visit Summary.  MyChart is used to connect with patients for Virtual Visits (Telemedicine).  Patients are able to view lab/test results, encounter notes, upcoming appointments, etc.  Non-urgent messages can be sent to your provider as well.   To learn more about what you can do with MyChart, go to [email or web address].    Your next appointment:   2 month(s)  The format for your next appointment:   In Person  Provider:   Ralene Burger, MD    Other Instructions NA

## 2024-07-02 NOTE — Progress Notes (Unsigned)
 Cardiology Consultation:    Date:  07/02/2024   ID:  Misty Beck, DOB 01-16-1971, MRN [id number]  PCP:  Daryl Setter, NP  Cardiologist:  Lamar Fitch, MD   Referring MD: Daryl Setter, NP   Chief Complaint  Patient presents with   Chest Pain   New Patient (Initial Visit)    History of Present Illness:    Misty Beck is a 53 y.o. female who is being seen today for the evaluation of chest pain at the request of Daryl Setter, NP.  Past medical history significant for dyslipidemia, remote smoking, migraines, apparently she received 4 times thrombolytic therapy because of strokelike syndrome however never MRI has been positive.  She was told that this is migraine.  She has been follow-up with neurologist.  She was referred to us  because of chest pain.  She said she had chest pain since she was 19 initially she told me that the pain lasts all day from the morning to the afternoon however then conversation revealed that she had attacks of pain that happening all different kind of situation she could be sitting doing nothing she will get it pain will become very severe up to 10, taking deep breath will give her [place] more pain.  She denies have any sweating associated with this sensation no radiation of the pain she pointed to the epigastrium to lower middle chest.  She said at the same time she can walk climb stairs with no difficulties, does not exercise on a regular basis.  She is to smoke years ago but is does not anymore.  Does have family history of premature coronary artery disease apparently her father did have a heart attack in his 30s he was not a smoker.  She is not taking any cholesterol medication she does have however mild hyperlipidemia with LDL of 119 HDL 62.  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  await old record.     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   Asthma    Atypical chest pain 06/12/2022   B12 deficiency 07/10/2022   Cerebrovascular accident (CVA) (HCC) 06/12/2022   Chronic bilateral low back pain without sciatica 04/08/2023   Chronic left shoulder pain 06/15/2020   Chronic migraine w/o aura w/o status migrainosus, not intractable 09/22/2021   Chronic nausea 04/16/2022   Constipation 05/15/2024   Depression with anxiety 04/20/2021   Referral source Dr Zachary osei bonsu   Diarrhea    referral source   Family history of ischemic heart disease (IHD) 10/10/2012   father and gradfather paternal     History of CVA (cerebrovascular accident) 04/16/2022   History of migraine headaches 01/04/2024   Hot flashes 04/16/2022   Hyperlipidemia    referral source   Insomnia 04/20/2021   referral source Dr Rocco   Knee pain, chronic 02/03/2013   Will send for xray     Left-sided weakness 08/01/2020   Leg length discrepancy 06/12/2022   Migraine with aura 12/12/2012   IMO update 2017     Myofascial pain 06/15/2020   Pain in joint of left shoulder 07/25/2018   Received intravenous tissue plasminogen activator (tPA) in emergency department 10/24/2021   Stroke [facility])    Dec, 2021, Jan 2022   Stroke-like symptoms 12/27/2022   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  Weight loss 06/12/2022    Past Surgical History:  Procedure Laterality Date   APPENDECTOMY  1992   left shoulder surgery     referral source Dr Joelyn Brunt   TOTAL VAGINAL HYSTERECTOMY  2009    Current Medications: Current Meds  Medication Sig   aspirin -acetaminophen -caffeine  (EXCEDRIN MIGRAINE) 250-250-65 MG tablet Take 2 tablets by mouth 3 (three) times daily as needed for headache or migraine.   cyclobenzaprine  (FLEXERIL ) 10 MG tablet Take 1 tablet (10 mg total) by mouth 3 (three) times daily as needed for muscle spasms.   diphenoxylate -atropine  (LOMOTIL ) 2.5-0.025 MG tablet TAKE ONE TABLET 4 TIMES A DAY AS NEEDED  FOR DIARRHEA OR LOOSE STOOLS   LORazepam  (ATIVAN ) 0.5 MG tablet Take 1 tablet (0.5 mg total) by mouth every 8 (eight) hours as needed. for anxiety   Melatonin 10 MG TABS Take 10 mg by mouth at bedtime as needed (sleep).   Multiple Vitamins-Minerals (MULTIVITAMIN WITH MINERALS) tablet Take 1 tablet by mouth daily.   ondansetron  (ZOFRAN -ODT) 4 MG disintegrating tablet Take 1 tablet (4 mg total) by mouth every 8 (eight) hours as needed for nausea or vomiting.   sertraline  (ZOLOFT ) 100 MG tablet Take 1.5 tablets (150 mg total) by mouth daily.   traZODone  (DESYREL ) 100 MG tablet TAKE 1 TABLET BY MOUTH DAILY AT BEDTIME AS NEEDED FOR SLEEP   Ubrogepant  (UBRELVY ) 100 MG TABS Take 1 tablet (100 mg total) by mouth as needed.     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 : Never true  Transportation Needs: No Transportation Needs (02/25/2023)   Received from [employer]    In the past 12 months, has lack of reliable transportation kept you from medical appointments, meetings, work or from getting  things needed for daily living? : No  Physical Activity: Not on file  Stress: Not on file  Social Connections: Not on file     Family History: The patient's family history includes Breast cancer (age of onset: 6) in her mother; COPD in her father; Cancer in her mother; Colon polyps in her mother; Heart disease in her father; Hyperlipidemia in her father; Kidney disease in her father; Skin cancer in her mother; Stroke in her mother. There is no history of Colon cancer, Rectal cancer, or Stomach cancer. ROS:   Please see the history of present illness.    All 14 point review of  systems negative except as described per history of present illness.  EKGs/Labs/Other Studies Reviewed:    The following studies were reviewed today:   EKG:  EKG Interpretation Date/Time:  Thursday July 02 2024 13:32:10 EST Ventricular Rate:  81 PR Interval:  142 QRS Duration:  74 QT Interval:  362 QTC Calculation: 420 R Axis:   95  Text Interpretation: Normal sinus rhythm Right atrial enlargement Rightward axis Pulmonary disease pattern When compared with ECG of 16-Apr-2024 15:47, PREVIOUS ECG IS PRESENT Confirmed by Bernie Charleston [phone]) on 07/02/2024 1:37:29 PM    Recent Labs: 04/16/2024: ALT 12; BUN 13; Creatinine, Ser 1.30; Hemoglobin 11.9; Platelets 220; Potassium 4.6; Sodium 141  Recent Lipid Panel    Component Value Date/Time   CHOL 224 (H) 12/13/2023 1143   TRIG 209.0 (H) 12/13/2023 1143   HDL 62.60 12/13/2023 1143   CHOLHDL 4 12/13/2023 1143   VLDL 41.8 (H) 12/13/2023 1143   LDLCALC 119 (H) 12/13/2023 1143    Physical Exam:    VS:  BP 100/68 (BP Location: Right Arm, Patient Position: Sitting, Cuff Size: Normal)   Pulse 75   Resp 16   Ht 5' 4 (1.626 m)   Wt 101 lb (45.8 kg)   SpO2 95%   BMI 17.34 kg/m     Wt Readings from Last 3 Encounters:  07/02/24 101 lb (45.8 kg)  05/15/24 100 lb (45.4 kg)  04/16/24 125 lb 3.2 oz (56.8 kg)     GEN:  Well nourished, well developed in  no acute distress HEENT: Normal NECK: No JVD; No carotid bruits LYMPHATICS: No lymphadenopathy CARDIAC: RRR, no murmurs, no rubs, no gallops RESPIRATORY:  Clear to auscultation without rales, wheezing or rhonchi  ABDOMEN: Soft, non-tender, non-distended MUSCULOSKELETAL:  No edema; No deformity  SKIN: Warm and dry NEUROLOGIC:  Alert and oriented x 3 PSYCHIATRIC:  Normal affect   ASSESSMENT:    1. Precordial pain   2. Atypical chest pain   3. History of CVA (cerebrovascular accident)   4. Mixed hyperlipidemia   5. Vision loss of left eye    PLAN:    In order of problems listed above:  Precordial chest pain.  Somewhat concerning characteristic but at the same time story somewhat inconsistent.  She tells me that she got those symptoms for the last 30 years and then she tells me that lately she does have also some exertional symptoms I think we need to try to go to the bottom of this and probably the best approach to this will be to do a coronary CT angio.  Explained procedure to her including risk benefits as well as alternatives.  She is already taking antiplatelet therapy.  As a part of evaluation echocardiogram will be done to assess left ventricular ejection fraction. Questionable CVA, apparently no lesions in the brain noted, she did receive thrombolytic therapy 4 times already.  But still neurology thinks that this is most likely migraines. Dyslipidemia will wait for results of coronary CT angio to decide about potential therapy   Medication Adjustments/Labs and Tests Ordered: Current medicines are reviewed at length with the patient today.  Concerns regarding medicines are outlined above.  Orders Placed This Encounter  Procedures   EKG 12-Lead   No orders of the defined types were placed in this encounter.   Signed, [place] DOROTHA Bernie, MD, [facility]. 07/02/2024 1:55 PM     [facility]

## 2024-07-09 ENCOUNTER — Other Ambulatory Visit: Payer: Self-pay | Admitting: Family

## 2024-07-09 DIAGNOSIS — F411 Generalized anxiety disorder: Secondary | ICD-10-CM

## 2024-07-09 NOTE — Telephone Encounter (Signed)
 Requesting: lorazepam  0.5mg  Contract:12/30/23 UDS:05/07/23 Last Visit: 05/15/24 Next Visit:08/19/24 Last Refill: 05/15/24 #30 and 0RF  Please Advise

## 2024-07-17 ENCOUNTER — Encounter (HOSPITAL_COMMUNITY): Payer: Self-pay

## 2024-07-20 ENCOUNTER — Telehealth (HOSPITAL_COMMUNITY): Payer: Self-pay | Admitting: Emergency Medicine

## 2024-07-20 NOTE — Telephone Encounter (Signed)
 Reaching out to patient to offer assistance regarding upcoming cardiac imaging study; pt verbalizes understanding of appt date/time, parking situation and where to check in, pre-test NPO status and medications ordered, and verified current allergies; name and call back number provided for further questions should they arise Rockwell Alexandria RN Navigator Cardiac Imaging [facility] [phone] office [phone] cell

## 2024-07-21 ENCOUNTER — Ambulatory Visit (HOSPITAL_BASED_OUTPATIENT_CLINIC_OR_DEPARTMENT_OTHER)
Admission: RE | Admit: 2024-07-21 | Discharge: 2024-07-21 | Disposition: A | Discharge status: Home / Self Care | Source: Ambulatory Visit | Attending: Cardiology | Admitting: Cardiology

## 2024-07-21 DIAGNOSIS — R072 Precordial pain: Secondary | ICD-10-CM | POA: Insufficient documentation

## 2024-07-21 MED ORDER — NITROGLYCERIN 0.4 MG SL SUBL
0.8000 mg | SUBLINGUAL_TABLET | Freq: Once | SUBLINGUAL | Status: AC
  Administered 2024-07-21: 0.8 mg via SUBLINGUAL

## 2024-07-21 MED ORDER — IOHEXOL 350 MG/ML SOLN
100.0000 mL | Freq: Once | INTRAVENOUS | Status: AC | PRN
  Administered 2024-07-21: 95 mL via INTRAVENOUS

## 2024-07-24 ENCOUNTER — Ambulatory Visit: Payer: Self-pay | Admitting: Cardiology

## 2024-07-27 NOTE — Telephone Encounter (Signed)
 Patient read MyChart results regarding coronary CT on 07/27/24.  Alan, RN

## 2024-07-29 ENCOUNTER — Ambulatory Visit (HOSPITAL_BASED_OUTPATIENT_CLINIC_OR_DEPARTMENT_OTHER)
Admission: RE | Admit: 2024-07-29 | Discharge: 2024-07-29 | Disposition: A | Discharge status: Home / Self Care | Source: Ambulatory Visit | Attending: Cardiology | Admitting: Cardiology

## 2024-07-29 DIAGNOSIS — R0609 Other forms of dyspnea: Secondary | ICD-10-CM | POA: Insufficient documentation

## 2024-07-30 LAB — ECHOCARDIOGRAM COMPLETE
AR max vel: 1.96 cm2
AV Area VTI: 2.03 cm2
AV Area mean vel: 1.98 cm2
AV Mean grad: 2 mmHg
AV Peak grad: 3.6 mmHg
Ao pk vel: 0.94 m/s
Area-P 1/2: 3.83 cm2
Calc EF: 60.3 %
S' Lateral: 2.1 cm
Single Plane A2C EF: 59 %
Single Plane A4C EF: 61.5 %

## 2024-08-05 NOTE — Telephone Encounter (Signed)
 Pt returning call. Please advise.

## 2024-08-18 ENCOUNTER — Telehealth: Payer: Self-pay | Admitting: Pharmacist

## 2024-08-18 ENCOUNTER — Other Ambulatory Visit (HOSPITAL_COMMUNITY): Payer: Self-pay

## 2024-08-18 ENCOUNTER — Other Ambulatory Visit: Payer: Self-pay | Admitting: Family

## 2024-08-18 ENCOUNTER — Telehealth: Payer: Self-pay

## 2024-08-18 DIAGNOSIS — F411 Generalized anxiety disorder: Secondary | ICD-10-CM

## 2024-08-18 NOTE — Telephone Encounter (Signed)
 Requesting: lorazepam  0.5mg   Contract: 12/30/23 UDS:  05/07/23 Last Visit: 05/15/24 Next Visit: 08/19/24 Last Refill: 07/09/24 #30 and 0RF   Please Advise

## 2024-08-18 NOTE — Telephone Encounter (Signed)
 Pharmacy Patient Advocate Encounter   Received notification from [facility] KEY that prior authorization for Nurtec is due for renewal.   Insurance verification completed.   The patient is insured through [facility] MEDICAID.  Action: Medication has been discontinued. Archived Key: [id number]   Per chart notes, patient stopped due to lack of benefit.

## 2024-08-18 NOTE — Telephone Encounter (Signed)
 Left message on My Chart with CT Angio results per Dr. Karry note. Routed to PCP.

## 2024-08-19 ENCOUNTER — Ambulatory Visit (INDEPENDENT_AMBULATORY_CARE_PROVIDER_SITE_OTHER): Admitting: Family

## 2024-08-19 VITALS — BP 95/57 | HR 71 | Temp 98.5°F | Resp 16 | Ht 64.0 in | Wt 98.4 lb

## 2024-08-19 DIAGNOSIS — D649 Anemia, unspecified: Secondary | ICD-10-CM | POA: Insufficient documentation

## 2024-08-19 DIAGNOSIS — E785 Hyperlipidemia, unspecified: Secondary | ICD-10-CM

## 2024-08-19 DIAGNOSIS — J45909 Unspecified asthma, uncomplicated: Secondary | ICD-10-CM

## 2024-08-19 DIAGNOSIS — M5431 Sciatica, right side: Secondary | ICD-10-CM | POA: Insufficient documentation

## 2024-08-19 DIAGNOSIS — G4700 Insomnia, unspecified: Secondary | ICD-10-CM

## 2024-08-19 DIAGNOSIS — F411 Generalized anxiety disorder: Secondary | ICD-10-CM

## 2024-08-19 DIAGNOSIS — E538 Deficiency of other specified B group vitamins: Secondary | ICD-10-CM

## 2024-08-19 MED ORDER — METHYLPREDNISOLONE 4 MG PO TBPK: ORAL_TABLET | ORAL | 0 refills | Status: AC

## 2024-08-19 MED ORDER — CYCLOBENZAPRINE HCL 10 MG PO TABS: 10.0000 mg | ORAL_TABLET | Freq: Three times a day (TID) | ORAL | 0 refills | Status: AC | PRN

## 2024-08-19 MED ORDER — TRAMADOL HCL 50 MG PO TABS: 50.0000 mg | ORAL_TABLET | Freq: Two times a day (BID) | ORAL | 0 refills | Status: DC | PRN

## 2024-08-19 MED ORDER — ATORVASTATIN CALCIUM 40 MG PO TABS: 40.0000 mg | ORAL_TABLET | Freq: Every day | ORAL | Status: AC

## 2024-08-19 MED ORDER — TRAZODONE HCL 100 MG PO TABS: 100.0000 mg | ORAL_TABLET | Freq: Every day | ORAL | 1 refills | Status: AC

## 2024-08-19 NOTE — Assessment & Plan Note (Signed)
 Has albuterol  inhaler on hand for prn use.  Stable currently.

## 2024-08-19 NOTE — Assessment & Plan Note (Signed)
" °  Chronic mild anemia with previous low iron levels. Oral iron supplements cause nausea. Potential GI blood loss considered. - Checked iron levels. - Provided stool test kit for occult blood to rule out GI bleeding. - Will consider slow-release iron supplement if levels are low. "

## 2024-08-19 NOTE — Progress Notes (Signed)
" ° °  Established Patient Office Visit  Subjective   Patient ID: Misty Beck, female    DOB: Sep 07, 1970  Age: 54 y.o. MRN: [id number]  Chief Complaint  Patient presents with   Anxiety    Here for follow up, on lorazepam  last [facility] 12/13/23   Anxiety     Patient in office today for follow up visit regarding her anxiety. Patient reports her anxiety is well controlled on Zoloft , trazodone , and lorazepam . Patient reports adequate sleep. Patient also report right gluteal pain for 3 weeks describes the pain as intense 8/10. She has tried heat/ice and flexeril  with no relief. Patient reports taking her husbands hydrocodone and did get relief for 4 hours, but then pain returned.   Review of Systems  Constitutional: Negative.   Respiratory: Negative.    Cardiovascular: Negative.   Musculoskeletal:        Patient reports pain in right glute.  Psychiatric/Behavioral:         Patient reports she does not have a good memory due to stroke hx     Objective:     BP (!) 95/57 (BP Location: Right Arm, Patient Position: Sitting, Cuff Size: Small)   Pulse 71   Temp 98.5 F (36.9 C) (Oral)   Resp 16   Ht 5' 4 (1.626 m)   Wt 98 lb 6.4 oz (44.6 kg)   SpO2 100%   BMI 16.89 kg/m  BP Readings from Last 3 Encounters:  08/19/24 (!) 95/57  07/21/24 (!) 84/56  07/02/24 100/68   Physical Exam Constitutional:      Appearance: Normal appearance.  HENT:     Head: Normocephalic.  Cardiovascular:     Rate and Rhythm: Normal rate and regular rhythm.     Heart sounds: Normal heart sounds.  Pulmonary:     Effort: Pulmonary effort is normal.     Breath sounds: Normal breath sounds.  Musculoskeletal:        General: Tenderness present. Normal range of motion.     Comments: Patient sitting on left hip  Neurological:     Mental Status: She is alert and oriented to person, place, and time.  Psychiatric:        Mood and Affect: Mood normal.        Behavior: Behavior normal.      Assessment & Plan:   Anxiety  -stable on zoloft , lorazepam , and trazodone   Sciatic nerve pain (right side) -medrol  dosepack rx -flexeril  refilled  -tramadol  rx per patient request  -patient educated not to take tramadol  and lorazepam  at the same time  B12 deficiency  -B12 lab  Anemia -ibc + ferritin  -fecal occult blood sample  Hyperlipidemia -atorvastatin  rx   Follow up in 6 months   Levon Budd, FNP student   "

## 2024-08-19 NOTE — Assessment & Plan Note (Signed)
Stable on trazodone, continue same.  

## 2024-08-19 NOTE — Patient Instructions (Signed)
" °  VISIT SUMMARY: During your visit, we discussed your anxiety management and right buttock pain. We also reviewed your asthma, anemia, and vitamin B12 deficiency. Additionally, we talked about your hyperlipidemia and general health maintenance.  YOUR PLAN: -SCIATICA, RIGHT SIDE: Sciatica is pain that radiates along the path of the sciatic nerve, which runs from your lower back through your hips and buttocks and down each leg. Your pain is likely due to nerve compression. We have prescribed a Medrol  Dose Pack to reduce inflammation and tramadol  for pain management. Please avoid using lorazepam  if you take tramadol . You can also use over-the-counter ibuprofen for additional pain relief.  -GENERALIZED ANXIETY DISORDER: Generalized anxiety disorder is characterized by persistent and excessive worry about various aspects of life. Your anxiety is well-controlled with sertraline  and lorazepam  as needed. We have refilled your trazodone  prescription for sleep and ensured your lorazepam  contract is up to date. Continue taking sertraline  150 mg daily.  -ASTHMA: Asthma is a condition in which your airways narrow and swell and may produce extra mucus, making breathing difficult. Your asthma is more prone to acting up in the summer due to humidity. Continue with your current asthma management plan.  -VITAMIN B12 DEFICIENCY: Vitamin B12 deficiency occurs when your body does not have enough of this vitamin, which is essential for nerve function and the production of red blood cells. We have checked your B12 levels to assess your current status. Continue taking your oral supplements.  -ANEMIA: Anemia is a condition in which you lack enough healthy red blood cells to carry adequate oxygen to your body's tissues. Your chronic mild anemia has been associated with low iron levels, and oral iron supplements have caused nausea. We have checked your iron levels and provided a stool test kit to rule out gastrointestinal  bleeding. We will consider a slow-release iron supplement if your levels are low.  -HYPERLIPIDEMIA: Hyperlipidemia is the presence of high levels of fats (lipids) in your blood, which can increase your risk of stroke. You were previously managed with atorvastatin  for stroke prevention but have stopped taking it. We advise you to resume taking atorvastatin  at night for stroke prevention.  -GENERAL HEALTH MAINTENANCE: You are due for tetanus, pneumonia, and hepatitis B vaccinations, but you have declined them. We discussed your immunization options.  INSTRUCTIONS: Please follow up as needed for any changes in your symptoms or if you have any concerns. Continue with your current medications and management plans as discussed. If you experience any new or worsening symptoms, please contact our office.    Contains text generated by [employer].   "

## 2024-08-19 NOTE — Progress Notes (Signed)
 "  Subjective:     Patient ID: Misty Beck, female    DOB: 12/30/1970, 54 y.o.   MRN: [id number]  Chief Complaint  Patient presents with   Anxiety    Here for follow up, on lorazepam  last [facility] 12/13/23    HPI  Discussed the use of AI scribe software for clinical note transcription with the patient, who gave verbal consent to proceed.  History of Present Illness Misty Beck is a 54 year old female who presents for a follow-up on her anxiety medication and right buttock pain.  Her anxiety is currently managed with sertraline  150 mg daily, trazodone  100 mg at night, and lorazepam  as needed. The addition of lorazepam  has significantly improved her symptoms.  She has been experiencing right buttock pain for the past three weeks, described as a throbbing pain rated 8 out of 10. The pain improves with movement but worsens when sitting. She has tried heat and ice without relief. She took one of her husband's medications, which reduced the pain from an 8 to a 1. No recent falls are reported, but she notes a history of multiple strokes affecting her memory. She has tried Flexeril  for muscle pain without relief and requests a refill for it and trazodone .  She has a history of asthma, which is more problematic in the summer humidity than in the winter cold. She keeps an inhaler for use as needed. She also has a history of anemia and has been advised to take iron supplements, though they often cause nausea. She is currently taking B12 pills instead of shots.  She has previously been on atorvastatin  but has stopped taking it without recalling a specific reason. She has a full bottle at home. She is not currently interested in receiving any immunizations, including tetanus, pneumonia, and hepatitis B.  She lives in [place] and has recently experienced issues with her home heating, which has been resolved. She has a history of using oil heating and is considering alternatives.      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  await old record.     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   Asthma    Atypical chest pain 06/12/2022   B12 deficiency 07/10/2022   Cerebrovascular accident (CVA) (HCC) 06/12/2022   Chronic bilateral low back pain without sciatica 04/08/2023   Chronic left shoulder pain 06/15/2020   Chronic migraine w/o aura w/o status migrainosus, not intractable 09/22/2021   Chronic nausea 04/16/2022   Constipation 05/15/2024   Depression with anxiety 04/20/2021   Referral source Dr Zachary osei bonsu   Diarrhea    referral source   Family history of ischemic heart disease (IHD) 10/10/2012   father and gradfather paternal     History of CVA (cerebrovascular accident) 04/16/2022   History of migraine headaches 01/04/2024   Hot flashes 04/16/2022   Hyperlipidemia    referral source   Insomnia 04/20/2021   referral source Dr Rocco   Knee pain, chronic 02/03/2013   Will send for xray     Left-sided weakness 08/01/2020   Leg length discrepancy 06/12/2022   Migraine with aura 12/12/2012   IMO update 2017     Myofascial pain  06/15/2020   Pain in joint of left shoulder 07/25/2018   Received intravenous tissue plasminogen activator (tPA) in emergency department 10/24/2021   Stroke [facility])    Dec, 2021, Jan 2022   Stroke-like symptoms 12/27/2022   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   Weight loss 06/12/2022    Past Surgical History:  Procedure Laterality Date   APPENDECTOMY  1992   left shoulder surgery     referral source Dr Joelyn Brunt   TOTAL VAGINAL HYSTERECTOMY  2009    Family History  Problem Relation Age of Onset   Stroke Mother    Cancer Mother    Colon polyps Mother    Skin cancer Mother    Breast  cancer Mother 105   Kidney disease Father    Hyperlipidemia Father    COPD Father    Heart disease Father    Colon cancer Neg Hx    Rectal cancer Neg Hx    Stomach cancer Neg Hx     Social History   Socioeconomic History   Marital status: Divorced    Spouse name: Not on file   Number of children: 2   Years of education: Not on file   Highest education level: Not on file  Occupational History   Not on file  Tobacco Use   Smoking status: Former    Types: Cigarettes   Smokeless tobacco: Never  Substance and Sexual Activity   Alcohol use: Not Currently   Drug use: Not Currently   Sexual activity: Not on file  Other Topics Concern   Not on file  Social History Narrative   [employer]   Social Drivers of Health   Tobacco Use: Medium Risk (07/02/2024)   Patient History    Smoking Tobacco Use: Former    Smokeless Tobacco Use: Never    Passive Exposure: Not on Actuary Strain: Not on file  Food Insecurity: Low Risk (02/25/2023)   Received from [facility]   Epic    Within the past 12 months, you worried that your food would run out before you got money to buy more: Never true    Within the past 12 months, the food you bought just didn't last and you didn't have money to get more. : Never true  Transportation Needs: No Transportation Needs (02/25/2023)   Received from [facility]    In the past 12 months, has lack of reliable transportation kept you from medical appointments, meetings, work or from getting things needed for daily living? : No  Physical Activity: Not on file  Stress: Not on file  Social Connections: Not on file  Intimate Partner Violence: Not on file  Depression (PHQ2-9): High Risk (08/19/2024)   Depression (PHQ2-9)    PHQ-2 Score: 13  Alcohol Screen: Not on file  Housing: Medium Risk (02/25/2023)   Received from [facility]   Epic    What is your living situation today?: I have a place to live today, but I am  worried about losing it in the future    Think about the place you live. Do you have problems with any of the following? Choose all that apply:: None/None on this list  Utilities: Medium Risk (02/25/2023)   Received from [facility]   Utilities    In the past 12 months has the electric, gas, oil, or water company threatened to shut off services in your home? : Yes  Health Literacy: Not on file  Outpatient Medications Prior to Visit  Medication Sig Dispense Refill   aspirin -acetaminophen -caffeine  (EXCEDRIN MIGRAINE) 250-250-65 MG tablet Take 2 tablets by mouth 3 (three) times daily as needed for headache or migraine.     diphenoxylate -atropine  (LOMOTIL ) 2.5-0.025 MG tablet TAKE ONE TABLET 4 TIMES A DAY AS NEEDED FOR DIARRHEA OR LOOSE STOOLS 30 tablet 0   LORazepam  (ATIVAN ) 0.5 MG tablet TAKE 1 TABLET BY MOUTH EVERY 8 HOURS AS NEEDED FOR ANXIETY 30 tablet 0   Melatonin 10 MG TABS Take 10 mg by mouth at bedtime as needed (sleep).     Multiple Vitamins-Minerals (MULTIVITAMIN WITH MINERALS) tablet Take 1 tablet by mouth daily.     ondansetron  (ZOFRAN -ODT) 4 MG disintegrating tablet Take 1 tablet (4 mg total) by mouth every 8 (eight) hours as needed for nausea or vomiting. 20 tablet 11   sertraline  (ZOLOFT ) 100 MG tablet Take 1.5 tablets (150 mg total) by mouth daily. 135 tablet 1   Ubrogepant  (UBRELVY ) 100 MG TABS Take 1 tablet (100 mg total) by mouth as needed. 16 tablet 11   cyclobenzaprine  (FLEXERIL ) 10 MG tablet Take 1 tablet (10 mg total) by mouth 3 (three) times daily as needed for muscle spasms. 30 tablet 0   traZODone  (DESYREL ) 100 MG tablet TAKE 1 TABLET BY MOUTH DAILY AT BEDTIME AS NEEDED FOR SLEEP 90 tablet 1   metoprolol  tartrate (LOPRESSOR ) 100 MG tablet Take one tablet 2 hours before cardiac CT for heart greater than 55 1 tablet 0   No facility-administered medications prior to visit.    Allergies[1]  ROS See HPI    Objective:    Physical Exam Constitutional:       General: She is not in acute distress.    Appearance: Normal appearance. She is well-developed.  HENT:     Head: Normocephalic and atraumatic.     Right Ear: External ear normal.     Left Ear: External ear normal.  Eyes:     General: No scleral icterus. Neck:     Thyroid: No thyromegaly.  Cardiovascular:     Rate and Rhythm: Normal rate and regular rhythm.     Heart sounds: Normal heart sounds. No murmur heard. Pulmonary:     Effort: Pulmonary effort is normal. No respiratory distress.     Breath sounds: Normal breath sounds. No wheezing.  Musculoskeletal:     Cervical back: Neck supple.  Skin:    General: Skin is warm and dry.  Neurological:     Mental Status: She is alert and oriented to person, place, and time.  Psychiatric:        Mood and Affect: Mood normal.        Behavior: Behavior normal.        Thought Content: Thought content normal.        Judgment: Judgment normal.      BP (!) 95/57 (BP Location: Right Arm, Patient Position: Sitting, Cuff Size: Small)   Pulse 71   Temp 98.5 F (36.9 C) (Oral)   Resp 16   Ht 5' 4 (1.626 m)   Wt 98 lb 6.4 oz (44.6 kg)   SpO2 100%   BMI 16.89 kg/m  Wt Readings from Last 3 Encounters:  08/19/24 98 lb 6.4 oz (44.6 kg)  07/02/24 101 lb (45.8 kg)  05/15/24 100 lb (45.4 kg)       Assessment & Plan:   Problem List Items Addressed This Visit       Unprioritized   Sciatica of right  side - Primary   Sciatica, right side Right-sided sciatica with severe pain likely due to nerve compression. Gabapentin  ineffective in the past she reports. Advised her hydrocodone not safe with lorazepam . Steroids preferred for anti-inflammatory effect. - Prescribed Medrol  Dose Pack for anti-inflammatory effect. - Advised against using hydrocodone with lorazepam . - Prescribed tramadol  for pain management, advised to skip lorazepam  dose if taking tramadol . - Recommended over-the-counter ibuprofen for additional pain relief if needed.       Relevant Medications   methylPREDNISolone  (MEDROL  DOSEPAK) 4 MG TBPK tablet   traMADol  (ULTRAM ) 50 MG tablet   cyclobenzaprine  (FLEXERIL ) 10 MG tablet   traZODone  (DESYREL ) 100 MG tablet   Insomnia   Stable on trazodone , continue same.       Relevant Medications   traZODone  (DESYREL ) 100 MG tablet   Hyperlipidemia   Relevant Medications   atorvastatin  (LIPITOR) 40 MG tablet   B12 deficiency   Relevant Orders   B12   Asthma   Has albuterol  inhaler on hand for prn use.  Stable currently.       Relevant Medications   methylPREDNISolone  (MEDROL  DOSEPAK) 4 MG TBPK tablet   Anxiety state   Stable on 150mg  sertraline , lorazepam .  Contract up to date for Lorazepam .       Relevant Medications   traZODone  (DESYREL ) 100 MG tablet   Anemia    Chronic mild anemia with previous low iron levels. Oral iron supplements cause nausea. Potential GI blood loss considered. - Checked iron levels. - Provided stool test kit for occult blood to rule out GI bleeding. - Will consider slow-release iron supplement if levels are low.      Relevant Orders   IBC + Ferritin   Fecal occult blood, imunochemical    Assessment & Plan General Health Maintenance Due for tetanus, pneumonia, and hepatitis B vaccinations. Declined vaccinations. - Discussed immunization options, patient declined vaccinations.   I have discontinued Marquite V. Arif's metoprolol  tartrate. I have also changed her traZODone . Additionally, I am having her start on methylPREDNISolone , traMADol , and atorvastatin . Lastly, I am having her maintain her Melatonin, aspirin -acetaminophen -caffeine , multivitamin with minerals, ondansetron , sertraline , diphenoxylate -atropine , Ubrelvy , LORazepam , and cyclobenzaprine .  Meds ordered this encounter  Medications   methylPREDNISolone  (MEDROL  DOSEPAK) 4 MG TBPK tablet    Sig: Take per package instruction    Dispense:  21 tablet    Refill:  0    Supervising Provider:   DOMENICA BLACKBIRD A [[id number]]    traMADol  (ULTRAM ) 50 MG tablet    Sig: Take 1 tablet (50 mg total) by mouth every 12 (twelve) hours as needed for up to 5 days.    Dispense:  10 tablet    Refill:  0    Supervising Provider:   DOMENICA BLACKBIRD A [[id number]]   cyclobenzaprine  (FLEXERIL ) 10 MG tablet    Sig: Take 1 tablet (10 mg total) by mouth 3 (three) times daily as needed for muscle spasms.    Dispense:  30 tablet    Refill:  0    Supervising Provider:   DOMENICA BLACKBIRD A [[id number]]   traZODone  (DESYREL ) 100 MG tablet    Sig: Take 1 tablet (100 mg total) by mouth at bedtime.    Dispense:  90 tablet    Refill:  1    Supervising Provider:   DOMENICA BLACKBIRD A [[id number]]   atorvastatin  (LIPITOR) 40 MG tablet    Sig: Take 1 tablet (40 mg total) by mouth daily.    Supervising Provider:   DOMENICA BLACKBIRD  A [[id number]]      [1]  Allergies Allergen Reactions   Doxycycline  Other (See Comments)    Burning skin    "

## 2024-08-19 NOTE — Assessment & Plan Note (Addendum)
 Stable on 150mg  sertraline , lorazepam .  Contract up to date for Lorazepam .

## 2024-08-19 NOTE — Assessment & Plan Note (Signed)
 Sciatica, right side Right-sided sciatica with severe pain likely due to nerve compression. Gabapentin  ineffective in the past she reports. Advised her hydrocodone not safe with lorazepam . Steroids preferred for anti-inflammatory effect. - Prescribed Medrol  Dose Pack for anti-inflammatory effect. - Advised against using hydrocodone with lorazepam . - Prescribed tramadol  for pain management, advised to skip lorazepam  dose if taking tramadol . - Recommended over-the-counter ibuprofen for additional pain relief if needed.

## 2024-08-20 ENCOUNTER — Ambulatory Visit: Payer: Self-pay | Admitting: Family

## 2024-08-20 DIAGNOSIS — M5431 Sciatica, right side: Secondary | ICD-10-CM

## 2024-08-20 LAB — IBC + FERRITIN
Ferritin: 22.2 ng/mL (ref 10.0–291.0)
Iron: 95 ug/dL (ref 42–145)
Saturation Ratios: 26.1 % (ref 20.0–50.0)
TIBC: 364 ug/dL (ref 250.0–450.0)
Transferrin: 260 mg/dL (ref 212.0–360.0)

## 2024-08-20 LAB — VITAMIN B12: Vitamin B-12: 392 pg/mL (ref 211–911)

## 2024-08-21 MED ORDER — TRAMADOL HCL 50 MG PO TABS: ORAL_TABLET | ORAL | 0 refills | Status: AC

## 2024-09-01 ENCOUNTER — Ambulatory Visit: Admitting: Cardiology

## 2024-10-28 ENCOUNTER — Ambulatory Visit: Admitting: Cardiology

## 2025-02-17 ENCOUNTER — Ambulatory Visit: Admitting: Family
